# Patient Record
Sex: Female | Born: 1975 | Race: White | Hispanic: No | Marital: Single | Smoking: Never smoker
Health system: Southern US, Community
[De-identification: ages and names within clinical notes are randomized; demographics above are authoritative.]

## PROBLEM LIST (undated history)

## (undated) DIAGNOSIS — G919 Hydrocephalus, unspecified: Secondary | ICD-10-CM

## (undated) DIAGNOSIS — K219 Gastro-esophageal reflux disease without esophagitis: Secondary | ICD-10-CM

## (undated) DIAGNOSIS — K5900 Constipation, unspecified: Secondary | ICD-10-CM

## (undated) DIAGNOSIS — G7100 Muscular dystrophy, unspecified: Secondary | ICD-10-CM

## (undated) DIAGNOSIS — F419 Anxiety disorder, unspecified: Secondary | ICD-10-CM

## (undated) HISTORY — PX: OTHER SURGICAL HISTORY: SHX169

## (undated) HISTORY — DX: Anxiety disorder, unspecified: F41.9

---

## 2005-03-21 ENCOUNTER — Inpatient Hospital Stay: Payer: Self-pay | Admitting: Surgery

## 2007-06-09 ENCOUNTER — Emergency Department: Payer: Self-pay | Admitting: Emergency Medicine

## 2007-06-09 ENCOUNTER — Other Ambulatory Visit: Payer: Self-pay

## 2007-06-10 ENCOUNTER — Inpatient Hospital Stay: Payer: Self-pay | Admitting: Internal Medicine

## 2007-08-09 ENCOUNTER — Emergency Department: Payer: Self-pay

## 2007-08-29 ENCOUNTER — Emergency Department: Payer: Self-pay

## 2007-09-19 ENCOUNTER — Emergency Department: Payer: Self-pay | Admitting: Emergency Medicine

## 2007-11-23 ENCOUNTER — Ambulatory Visit: Payer: Self-pay | Admitting: Gastroenterology

## 2007-12-01 ENCOUNTER — Emergency Department: Payer: Self-pay | Admitting: Emergency Medicine

## 2008-09-07 ENCOUNTER — Emergency Department: Payer: Self-pay | Admitting: Emergency Medicine

## 2008-11-21 ENCOUNTER — Emergency Department: Payer: Self-pay | Admitting: Emergency Medicine

## 2008-11-21 ENCOUNTER — Ambulatory Visit: Payer: Self-pay | Admitting: Family Medicine

## 2008-12-28 ENCOUNTER — Emergency Department: Payer: Self-pay | Admitting: Emergency Medicine

## 2009-02-18 ENCOUNTER — Ambulatory Visit: Payer: Self-pay | Admitting: Unknown Physician Specialty

## 2009-03-05 ENCOUNTER — Ambulatory Visit: Payer: Self-pay | Admitting: Unknown Physician Specialty

## 2010-10-09 ENCOUNTER — Ambulatory Visit: Payer: Self-pay | Admitting: Internal Medicine

## 2011-04-15 ENCOUNTER — Ambulatory Visit: Payer: Self-pay | Admitting: Family Medicine

## 2012-02-24 ENCOUNTER — Ambulatory Visit: Payer: Self-pay | Admitting: Internal Medicine

## 2012-03-04 ENCOUNTER — Ambulatory Visit: Payer: Self-pay | Admitting: Internal Medicine

## 2012-05-14 ENCOUNTER — Ambulatory Visit: Payer: Self-pay | Admitting: Internal Medicine

## 2012-06-14 ENCOUNTER — Ambulatory Visit: Payer: Self-pay | Admitting: Emergency Medicine

## 2012-09-26 ENCOUNTER — Emergency Department: Payer: Self-pay | Admitting: Emergency Medicine

## 2012-09-26 LAB — URINALYSIS, COMPLETE
Bacteria: NONE SEEN
Bilirubin,UR: NEGATIVE
Blood: NEGATIVE
Glucose,UR: NEGATIVE mg/dL (ref 0–75)
Hyaline Cast: 11
Nitrite: NEGATIVE
Ph: 5 (ref 4.5–8.0)
Protein: NEGATIVE
Specific Gravity: 1.03 (ref 1.003–1.030)

## 2012-09-26 LAB — CBC WITH DIFFERENTIAL/PLATELET
Basophil #: 0 10*3/uL (ref 0.0–0.1)
Eosinophil #: 0 10*3/uL (ref 0.0–0.7)
Eosinophil %: 0 %
HGB: 14.7 g/dL (ref 12.0–16.0)
Lymphocyte %: 2.1 %
MCH: 30.1 pg (ref 26.0–34.0)
Neutrophil %: 92.5 %
Platelet: 248 10*3/uL (ref 150–440)
RDW: 13.7 % (ref 11.5–14.5)
WBC: 23 10*3/uL — ABNORMAL HIGH (ref 3.6–11.0)

## 2012-09-26 LAB — COMPREHENSIVE METABOLIC PANEL
Albumin: 4.5 g/dL (ref 3.4–5.0)
BUN: 21 mg/dL — ABNORMAL HIGH (ref 7–18)
Calcium, Total: 9.9 mg/dL (ref 8.5–10.1)
Co2: 26 mmol/L (ref 21–32)
Creatinine: 0.7 mg/dL (ref 0.60–1.30)
EGFR (Non-African Amer.): >60
Osmolality: 298 (ref 275–301)
SGOT(AST): 26 U/L (ref 15–37)
Sodium: 148 mmol/L — ABNORMAL HIGH (ref 136–145)

## 2012-09-27 ENCOUNTER — Ambulatory Visit: Payer: Self-pay | Admitting: Emergency Medicine

## 2012-09-27 LAB — COMPREHENSIVE METABOLIC PANEL
Albumin: 3.5 g/dL (ref 3.4–5.0)
Alkaline Phosphatase: 71 U/L (ref 50–136)
Anion Gap: 11 (ref 7–16)
BUN: 17 mg/dL (ref 7–18)
Bilirubin,Total: 1 mg/dL (ref 0.2–1.0)
Calcium, Total: 9.4 mg/dL (ref 8.5–10.1)
Chloride: 111 mmol/L — ABNORMAL HIGH (ref 98–107)
Co2: 27 mmol/L (ref 21–32)
Creatinine: 0.7 mg/dL (ref 0.60–1.30)
EGFR (African American): >60
EGFR (Non-African Amer.): >60
Glucose: 96 mg/dL (ref 65–99)
Osmolality: 298 (ref 275–301)
Potassium: 3 mmol/L — ABNORMAL LOW (ref 3.5–5.1)
SGOT(AST): 24 U/L (ref 15–37)
SGPT (ALT): 14 U/L (ref 12–78)
Sodium: 149 mmol/L — ABNORMAL HIGH (ref 136–145)
Total Protein: 6.9 g/dL (ref 6.4–8.2)

## 2012-09-27 LAB — LIPASE, BLOOD: Lipase: 52 U/L — ABNORMAL LOW (ref 73–393)

## 2012-09-27 LAB — CBC WITH DIFFERENTIAL/PLATELET
Basophil %: 0.4 %
Eosinophil #: 0.1 10*3/uL (ref 0.0–0.7)
Eosinophil %: 0.4 %
HCT: 39.8 % (ref 35.0–47.0)
HGB: 12.9 g/dL (ref 12.0–16.0)
Lymphocyte #: 1.3 10*3/uL (ref 1.0–3.6)
Lymphocyte %: 8.1 %
MCV: 91 fL (ref 80–100)
Monocyte %: 6.7 %
Neutrophil %: 84.4 %
Platelet: 185 10*3/uL (ref 150–440)
RDW: 13.5 % (ref 11.5–14.5)

## 2012-09-28 ENCOUNTER — Inpatient Hospital Stay: Payer: Self-pay | Admitting: Surgery

## 2012-09-28 LAB — BASIC METABOLIC PANEL
BUN: 5 mg/dL — ABNORMAL LOW (ref 7–18)
Calcium, Total: 8.5 mg/dL (ref 8.5–10.1)
Chloride: 112 mmol/L — ABNORMAL HIGH (ref 98–107)
Creatinine: 0.5 mg/dL — ABNORMAL LOW (ref 0.60–1.30)
EGFR (Non-African Amer.): >60
Glucose: 93 mg/dL (ref 65–99)
Osmolality: 286 (ref 275–301)
Potassium: 3.1 mmol/L — ABNORMAL LOW (ref 3.5–5.1)
Sodium: 145 mmol/L (ref 136–145)

## 2012-11-21 ENCOUNTER — Ambulatory Visit: Payer: Self-pay

## 2013-02-20 ENCOUNTER — Ambulatory Visit: Payer: Self-pay | Admitting: Emergency Medicine

## 2013-03-28 ENCOUNTER — Ambulatory Visit: Payer: Self-pay | Admitting: Family Medicine

## 2013-07-28 ENCOUNTER — Encounter: Payer: Self-pay | Admitting: Podiatry

## 2013-07-31 ENCOUNTER — Ambulatory Visit (INDEPENDENT_AMBULATORY_CARE_PROVIDER_SITE_OTHER): Payer: Medicaid Other | Admitting: Podiatry

## 2013-07-31 ENCOUNTER — Encounter: Payer: Self-pay | Admitting: Podiatry

## 2013-07-31 ENCOUNTER — Ambulatory Visit: Payer: Self-pay | Admitting: Podiatry

## 2013-07-31 VITALS — BP 129/55 | HR 81 | Resp 16 | Wt 117.0 lb

## 2013-07-31 DIAGNOSIS — M79609 Pain in unspecified limb: Secondary | ICD-10-CM

## 2013-07-31 DIAGNOSIS — B351 Tinea unguium: Secondary | ICD-10-CM

## 2013-07-31 NOTE — Progress Notes (Signed)
Lindsey Allison presents today with a chief complaint of painful toenails one through 5 bilateral.  Objective: Vital signs are stable she is alert and oriented x3. She has pain on palpation of the nails 1 through 5 bilateral.  Assessment: Pain in limb secondary to onychomycosis.  Plan: Debridement of nails 1 through 5 bilateral covered service secondary to pain. Followup with her in 3 months

## 2013-10-30 ENCOUNTER — Ambulatory Visit: Payer: Medicaid Other | Admitting: Podiatry

## 2013-11-15 ENCOUNTER — Encounter: Payer: Self-pay | Admitting: Podiatry

## 2013-11-15 ENCOUNTER — Ambulatory Visit (INDEPENDENT_AMBULATORY_CARE_PROVIDER_SITE_OTHER): Payer: Medicaid Other | Admitting: Podiatry

## 2013-11-15 VITALS — BP 122/74 | HR 76 | Resp 16

## 2013-11-15 DIAGNOSIS — M79609 Pain in unspecified limb: Secondary | ICD-10-CM

## 2013-11-15 DIAGNOSIS — B351 Tinea unguium: Secondary | ICD-10-CM

## 2013-11-15 NOTE — Progress Notes (Signed)
She presents today with a chief complaint of painful elongated toenails. They hurt when I walk.  Objective: Vital signs are stable she is alert and oriented x3. Pulses are palpable bilateral mild edema bilateral. Nails are thick yellow dystrophic clinically mycotic and incurvated and turned under.  Assessment: Pain in limb secondary to onychomycosis and nail deformity.   Plan: Discussed etiology pathology conservative surgical therapies debridement nails 1 through 5 bilateral. Followup with her in 3 months.

## 2014-02-19 ENCOUNTER — Ambulatory Visit (INDEPENDENT_AMBULATORY_CARE_PROVIDER_SITE_OTHER): Payer: Medicaid Other | Admitting: Podiatry

## 2014-02-19 VITALS — BP 92/76 | HR 89 | Resp 16

## 2014-02-19 DIAGNOSIS — B351 Tinea unguium: Secondary | ICD-10-CM

## 2014-02-19 DIAGNOSIS — M79609 Pain in unspecified limb: Secondary | ICD-10-CM

## 2014-02-19 DIAGNOSIS — M79676 Pain in unspecified toe(s): Secondary | ICD-10-CM

## 2014-02-19 NOTE — Progress Notes (Signed)
She presents today chief complaint of painful elongated toenails.  Objective: Nails are thick yellow dystrophic with mycotic and painful palpation.  Assessment: Pain in limb secondary to onychomycosis 1 through 5 bilateral.  Plan: Debridement of nails 1 through 5 bilateral covered service secondary to pain.

## 2014-05-30 ENCOUNTER — Ambulatory Visit (INDEPENDENT_AMBULATORY_CARE_PROVIDER_SITE_OTHER): Payer: Medicaid Other | Admitting: Podiatry

## 2014-05-30 DIAGNOSIS — B351 Tinea unguium: Secondary | ICD-10-CM

## 2014-05-30 DIAGNOSIS — M79676 Pain in unspecified toe(s): Secondary | ICD-10-CM | POA: Diagnosis not present

## 2014-05-30 NOTE — Progress Notes (Signed)
Presents today chief complaint of painful elongated toenails.  Objective: Pulses are palpable bilateral nails are thick, yellow dystrophic onychomycosis and painful palpation.   Assessment: Onychomycosis with pain in limb.  Plan: Treatment of nails in thickness and length as covered service secondary to pain.  

## 2014-08-29 ENCOUNTER — Ambulatory Visit: Payer: Medicaid Other | Admitting: Podiatry

## 2014-09-10 ENCOUNTER — Ambulatory Visit (INDEPENDENT_AMBULATORY_CARE_PROVIDER_SITE_OTHER): Payer: Medicaid Other | Admitting: Podiatry

## 2014-09-10 DIAGNOSIS — B351 Tinea unguium: Secondary | ICD-10-CM | POA: Diagnosis not present

## 2014-09-10 DIAGNOSIS — M79676 Pain in unspecified toe(s): Secondary | ICD-10-CM | POA: Diagnosis not present

## 2014-09-10 NOTE — Progress Notes (Signed)
She presents today chief complaint of painful elongated toenails.  Objective: Nails are thick yellow dystrophic with mycotic and painful palpation.  Assessment: Pain in limb secondary to onychomycosis 1 through 5 bilateral.  Plan: Debridement of nails 1 through 5 bilateral covered service secondary to pain. 

## 2014-12-07 ENCOUNTER — Ambulatory Visit
Admit: 2014-12-07 | Disposition: A | Discharge status: Home / Self Care | Payer: Self-pay | Attending: Family Medicine | Admitting: Family Medicine

## 2014-12-10 ENCOUNTER — Ambulatory Visit (INDEPENDENT_AMBULATORY_CARE_PROVIDER_SITE_OTHER): Payer: Medicaid Other | Admitting: Podiatry

## 2014-12-10 DIAGNOSIS — M79676 Pain in unspecified toe(s): Secondary | ICD-10-CM | POA: Diagnosis not present

## 2014-12-10 DIAGNOSIS — B351 Tinea unguium: Secondary | ICD-10-CM

## 2014-12-10 NOTE — Progress Notes (Signed)
She presents today chief complaint of painful elongated toenails.  Objective: Nails are thick yellow dystrophic with mycotic and painful palpation.  Assessment: Pain in limb secondary to onychomycosis 1 through 5 bilateral.  Plan: Debridement of nails 1 through 5 bilateral covered service secondary to pain. 

## 2014-12-14 NOTE — H&P (Signed)
    Subjective/Chief Complaint nausea and vomiting    History of Present Illness Sunday  2d n/v, single BM yesterday, no diarrhea, no one else is ill at home no f/c no hemetemesis, not passing gas no prior episode    Past History CP VP shunt GERD   Past Med/Surgical Hx:  GERD:   tendency to falls.  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:   ALLERGIES:  Belladonna/ Butabarbital: Unknown  HOME MEDICATIONS: Medication Instructions Status  Ativan 0.5 mg oral tablet 1 tab(s) orally once a day (at bedtime) Active  Zofran 8 mg oral tablet 1 tab(s) orally 2 times a day Active  Prilosec 20 mg oral delayed release capsule 1 cap(s) orally once a day Active  multivitamin 1 tab(s) orally once a day Active  Senna 2 tab(s) orally once a day, As Needed- for Constipation  Active  Depo-Provera 1  injectable every 3 months Active  Periogard 0.12% mucous membrane liquid 15 milliliter(s) mucous membrane 2 times a day Active   Family and Social History:   Family History Non-Contributory    Social History negative tobacco, negative ETOH    Place of Living Nursing Home   Review of Systems:   Fever/Chills No    Cough No    Sputum No    Abdominal Pain Yes  resolving    Diarrhea No    Constipation Yes    Nausea/Vomiting Yes    SOB/DOE No    Chest Pain No    Dysuria No    Tolerating Diet No  Nauseated  Vomiting    Medications/Allergies Reviewed Medications/Allergies reviewed   Physical Exam:   GEN no acute distress, thin    HEENT pink conjunctivae, dry oral mucosa, poor dentition    NECK supple    RESP normal resp effort  clear BS  no use of accessory muscles    CARD regular rate    ABD denies tenderness  soft  distended  normal BS    LYMPH negative neck    SKIN normal to palpation    PSYCH alert, answers questions well, caregiver present   Radiology Results: XRay:    04-Feb-14 17:05, Abdomen 2V Flat and Erect  ([place])   Abdomen 2V Flat and Erect ([place])   REASON FOR EXAM:    pain vomiting  COMMENTS:       PROCEDURE: MDR - MDR ABDOMEN 2V FLAT AND ERECT  - Sep 27 2012  5:05PM     RESULT: Multiple dilated small bowel loops are noted. Colon is   nondistended. These findings suggest partial or early small bowel   obstruction. No free air. Surgical clips right upper quadrant.   Ventriculoperitoneal shunt tube noted. Stool is present in the colon.    IMPRESSION:  Findings suggesting partial versus early small bowel   obstruction. Adynamic ileus cannot becompletely excluded.        Verified By: Gwynn BurlyHOMAS E. REGISTER, M.D., MD     Assessment/Admission Diagnosis CT rev'd ileus vs pSBO admit, correct electrolytes reexamine repeat films, NG tube   Electronic Signatures: Lattie Hawooper, Richard E (MD)  (Signed 05-Feb-14 01:57)  Authored: CHIEF COMPLAINT and HISTORY, PAST MEDICAL/SURGIAL HISTORY, ALLERGIES, HOME MEDICATIONS, FAMILY AND SOCIAL HISTORY, REVIEW OF SYSTEMS, PHYSICAL EXAM, Radiology, ASSESSMENT AND PLAN   Last Updated: 05-Feb-14 01:57 by Lattie Hawooper, Richard E (MD)

## 2014-12-14 NOTE — Discharge Summary (Signed)
PATIENT NAME:  Lindsey Allison, Lindsey Allison MR#:  [id number] DATE OF BIRTH:  1976-02-27  DATE OF ADMISSION:  09/28/2012 DATE OF DISCHARGE:  09/30/2012  CHIEF COMPLAINT: Nausea and vomiting.   DIAGNOSES: Partial small bowel obstruction versus ileus, reflux disease and neurologic deficit.   PROCEDURES: None.   CONSULTANTS: None.   HISTORY OF PRESENT ILLNESS/HOSPITAL COURSE: This is a patient who has some form of either cerebral palsy or mental retardation. She is able answer questions well, but apparently lives in a group home and is accompanied in the ER by a caregiver, who assists with history. The patient had been having nausea and vomiting for several days. Her pain was nearly completely gone when she was seen in the Emergency Room, but had been vomiting multiple times without hematemesis and had a small bowel movement earlier in the day. A workup had suggested a partial small bowel obstruction. She was admitted to the hospital and a nasogastric tube was placed. However, she kept pulling the nasogastric tube out. Ultimately, she was passing gas and repeat films demonstrated improvement in the partial small bowel obstruction versus ileus and spontaneous resolution. She was sent home, tolerating a regular diet to resume all of her home medications and follow up in our office as needed.     ____________________________ Adah Salvageichard E. Excell Seltzerooper, MD rec:aw D: 10/09/2012 19:35:22 ET T: 10/10/2012 10:09:57 ET JOB#: [id number]  cc: Adah Salvageichard E. Excell Seltzerooper, MD, <Dictator> Lattie HawICHARD E COOPER MD ELECTRONICALLY SIGNED 10/17/2012 9:42

## 2014-12-14 NOTE — H&P (Signed)
PATIENT NAME:  Lindsey Allison, [name] MR#:  [id number] DATE OF BIRTH:  November 18, 1975  DATE OF ADMISSION:  09/27/2012  CHIEF COMPLAINT:  Nausea and vomiting.   HISTORY OF PRESENT ILLNESS:  This is a patient who has cerebral palsy and/or mental retardation, apparently lives in a group home and is accompanied by a caregiver.  She presents with two days of nausea, vomiting and abdominal pain.  The abdominal pain is resolving or almost gone, but she has vomited multiple times.  Denies hematemesis, had a small bowel movement or two yesterday, none since and no flatus.  She has not had any melena or hematochezia.  A lot of this history is from the patient who answers questions fairly well, but some of her questions are answered by the caregiver as well.    She has never had an episode like this before according to her, however in reviewing her chart she had an episode of what was believed to be a small bowel obstruction and was admitted to the hospital back in 2008 with spontaneous resolution of this.   PAST MEDICAL HISTORY:  Reflux disease, mental retardation versus cerebral palsy.   PAST SURGICAL HISTORY:  VP shunt.   ALLERGIES:  BELLADONNA AND BUTABARBITAL.   MEDICATIONS:  Zofran, senna, Prilosec, Peri-Guard, Depo-Provera and Ativan.   FAMILY HISTORY:  Noncontributory.   SOCIAL HISTORY:  The patient does not smoke or drink.   REVIEW OF SYSTEMS:  Not obtainable with the exception of that mentioned in the history of present illness.   PHYSICAL EXAMINATION: GENERAL:  Healthy, comfortable, thin Caucasian female patient.  VITAL SIGNS:  Afebrile with a temperature of 98.9, pulse of 92, respirations 18, blood pressure 112/53.  The pain scale is 8, but when I questioned her, her pain was near 0, 100% room air sat.  HEENT:  Shows no scleral icterus.  No palpable neck nodes.  Her mouth is performed open and therefore her teeth and oral mucosa are fairly dry.  CHEST:  Clear to auscultation.  CARDIAC:   Regular rate and rhythm.  ABDOMEN:  Soft, slightly distended, nontender, minimally tympanitic.  EXTREMITIES:  Are without edema.  NEUROLOGIC:  Grossly intact with the exception of her slight mental retardation.  INTEGUMENT:  Shows no jaundice.   LABORATORY, DIAGNOSTIC AND RADIOLOGICAL DATA:  CT scan is personally reviewed, suggestive of an ileus versus a small bowel obstruction.  There is gas throughout the colon and a VP shunt is noted.    White blood cell count is 15.4, H and H of 12.9 and 40, platelet count of 185, potassium is 3.0, sodium of 149, chloride of 111.   ASSESSMENT AND PLAN:  This is a patient with an ileus versus small bowel obstruction.  She will be admitted to the hospital and a nasogastric tube will be placed.  She will be rehydrated and electrolytes will be corrected.  Serial examination will be performed with serial x-rays.  Hopefully this will resolve without surgical intervention as it has in the past.     ____________________________ Adah Salvageichard E. Excell Seltzerooper, MD rec:ea D: 09/28/2012 02:05:27 ET T: 09/28/2012 04:14:51 ET JOB#: [id number]  cc: Adah Salvageichard E. Excell Seltzerooper, MD, <Dictator> Lattie HawICHARD E COOPER MD ELECTRONICALLY SIGNED 09/29/2012 1:25

## 2015-02-04 DIAGNOSIS — G7111 Myotonic muscular dystrophy: Secondary | ICD-10-CM | POA: Insufficient documentation

## 2015-03-25 ENCOUNTER — Ambulatory Visit (INDEPENDENT_AMBULATORY_CARE_PROVIDER_SITE_OTHER): Payer: Medicaid Other | Admitting: Podiatry

## 2015-03-25 DIAGNOSIS — B351 Tinea unguium: Secondary | ICD-10-CM

## 2015-03-25 DIAGNOSIS — M79676 Pain in unspecified toe(s): Secondary | ICD-10-CM | POA: Diagnosis not present

## 2015-03-25 NOTE — Progress Notes (Signed)
She presents today chief complaint of painful elongated toenails.  Objective: Nails are thick yellow dystrophic with mycotic and painful palpation.  Assessment: Pain in limb secondary to onychomycosis 1 through 5 bilateral.  Plan: Debridement of nails 1 through 5 bilateral covered service secondary to pain. 3 mo.

## 2015-06-24 ENCOUNTER — Ambulatory Visit (INDEPENDENT_AMBULATORY_CARE_PROVIDER_SITE_OTHER): Payer: Medicaid Other | Admitting: Podiatry

## 2015-06-24 DIAGNOSIS — M79676 Pain in unspecified toe(s): Secondary | ICD-10-CM | POA: Diagnosis not present

## 2015-06-24 DIAGNOSIS — B351 Tinea unguium: Secondary | ICD-10-CM

## 2015-06-24 NOTE — Progress Notes (Signed)
She presents today chief complaint of painful elongated toenails.  Objective: Nails are thick yellow dystrophic with mycotic and painful palpation.  Assessment: Pain in limb secondary to onychomycosis 1 through 5 bilateral.  Plan: Debridement of nails 1 through 5 bilateral covered service secondary to pain. 3 mo.  Arbutus Pedodd Hyatt DPM

## 2015-09-23 ENCOUNTER — Encounter (INDEPENDENT_AMBULATORY_CARE_PROVIDER_SITE_OTHER): Payer: Medicaid Other | Admitting: Podiatry

## 2015-09-23 ENCOUNTER — Ambulatory Visit: Payer: Medicaid Other

## 2015-09-23 ENCOUNTER — Encounter: Payer: Self-pay | Admitting: Podiatry

## 2015-09-23 DIAGNOSIS — B351 Tinea unguium: Secondary | ICD-10-CM | POA: Diagnosis not present

## 2015-09-23 DIAGNOSIS — M79676 Pain in unspecified toe(s): Secondary | ICD-10-CM | POA: Diagnosis not present

## 2015-09-23 NOTE — Addendum Note (Signed)
Addended by: Ernestene Kiel T on: 09/23/2015 10:09 AM   Modules accepted: Medications, Level of Service

## 2015-09-23 NOTE — Progress Notes (Addendum)
  She presents today with a chief complaint of painful elongated toenails.  Objective: Vital signs stable alert and oriented 3. Pulses are palpable. Toenails are thick yellow dystrophic onychomycotic. No open lesions or wounds.  Assessment: Pain in limb secondary to onychomycosis 1 through 5 bilateral.  Plan: Debridement of toenails 1 through 5 bilateral covered service secondary to pain. Follow up with her in 3 months.

## 2015-10-22 ENCOUNTER — Encounter: Payer: Self-pay | Admitting: Emergency Medicine

## 2015-10-22 ENCOUNTER — Ambulatory Visit
Admission: EM | Admit: 2015-10-22 | Discharge: 2015-10-22 | Disposition: A | Discharge status: Home / Self Care | Payer: Medicaid Other | Attending: Family Medicine | Admitting: Family Medicine

## 2015-10-22 DIAGNOSIS — J069 Acute upper respiratory infection, unspecified: Secondary | ICD-10-CM | POA: Insufficient documentation

## 2015-10-22 DIAGNOSIS — B349 Viral infection, unspecified: Secondary | ICD-10-CM

## 2015-10-22 DIAGNOSIS — J029 Acute pharyngitis, unspecified: Secondary | ICD-10-CM | POA: Insufficient documentation

## 2015-10-22 HISTORY — DX: Muscular dystrophy, unspecified: G71.00

## 2015-10-22 HISTORY — DX: Gastro-esophageal reflux disease without esophagitis: K21.9

## 2015-10-22 HISTORY — DX: Constipation, unspecified: K59.00

## 2015-10-22 HISTORY — DX: Hydrocephalus, unspecified: G91.9

## 2015-10-22 LAB — RAPID INFLUENZA A&B ANTIGENS (ARMC ONLY)
INFLUENZA A (ARMC): NOT DETECTED — AB
INFLUENZA B (ARMC): NOT DETECTED — AB

## 2015-10-22 LAB — RAPID STREP SCREEN (MED CTR MEBANE ONLY): STREPTOCOCCUS, GROUP A SCREEN (DIRECT): NEGATIVE

## 2015-10-22 MED ORDER — LIDOCAINE VISCOUS 2 % MT SOLN
15.0000 mL | Freq: Three times a day (TID) | OROMUCOSAL | Status: DC | PRN
Start: 2015-10-22 — End: 2016-12-24

## 2015-10-22 MED ORDER — LORATADINE 10 MG PO TABS: 10.0000 mg | ORAL_TABLET | Freq: Every day | ORAL | Status: AC

## 2015-10-22 NOTE — ED Notes (Signed)
Pt with caregiver, coughing started this weekend, now sore throat and looks worse. Denies fever.

## 2015-10-22 NOTE — Discharge Instructions (Signed)
Take medication as prescribed. Rest. Drink plenty of fluids. Take over the counter tylenol or ibuprofen as needed for fever. Continue home medications.  Follow up with your primary care physician this week in one week.   Return to Urgent care for new or worsening concerns.   Viral Infections A viral infection can be caused by different types of viruses.Most viral infections are not serious and resolve on their own. However, some infections may cause severe symptoms and may lead to further complications. SYMPTOMS Viruses can frequently cause:  Minor sore throat.  Aches and pains.  Headaches.  Runny nose.  Different types of rashes.  Watery eyes.  Tiredness.  Cough.  Loss of appetite.  Gastrointestinal infections, resulting in nausea, vomiting, and diarrhea. These symptoms do not respond to antibiotics because the infection is not caused by bacteria. However, you might catch a bacterial infection following the viral infection. This is sometimes called a "superinfection." Symptoms of such a bacterial infection may include:  Worsening sore throat with pus and difficulty swallowing.  Swollen neck glands.  Chills and a high or persistent fever.  Severe headache.  Tenderness over the sinuses.  Persistent overall ill feeling (malaise), muscle aches, and tiredness (fatigue).  Persistent cough.  Yellow, green, or brown mucus production with coughing. HOME CARE INSTRUCTIONS   Only take over-the-counter or prescription medicines for pain, discomfort, diarrhea, or fever as directed by your caregiver.  Drink enough water and fluids to keep your urine clear or pale yellow. Sports drinks can provide valuable electrolytes, sugars, and hydration.  Get plenty of rest and maintain proper nutrition. Soups and broths with crackers or rice are fine. SEEK IMMEDIATE MEDICAL CARE IF:   You have severe headaches, shortness of breath, chest pain, neck pain, or an unusual rash.  You  have uncontrolled vomiting, diarrhea, or you are unable to keep down fluids.  You or your child has an oral temperature above 102 F (38.9 C), not controlled by medicine.  Your baby is older than 3 months with a rectal temperature of 102 F (38.9 C) or higher.  Your baby is 55 months old or younger with a rectal temperature of 100.4 F (38 C) or higher. MAKE SURE YOU:   Understand these instructions.  Will watch your condition.  Will get help right away if you are not doing well or get worse.   This information is not intended to replace advice given to you by your health care provider. Make sure you discuss any questions you have with your health care provider.   Document Released: 05/20/2005 Document Revised: 11/02/2011 Document Reviewed: 01/16/2015 Elsevier Interactive Patient Education [employer]

## 2015-10-22 NOTE — ED Provider Notes (Signed)
[facility]  ____________________________________________  Time seen: Approximately 7:25 PM  I have reviewed the triage vital signs and the nursing notes.   HISTORY  Chief Complaint Sore Throat and URI  HPI Lindsey Allison is a 40 y.o. female presents with caregiver from group home at bedside. Presents for the complaints of 3-4 days of runny nose, nasal congestion, sore throat and cough.  Denies fevers. Reports continues to eat and drink. Denies behavior changes. Patient reports that she has continued to go to work patient persisted she did work today. Caregiver at bedside also denies fevers. Denies sick contacts in group home. Reports onset contacts at work.  Patient states that her main complaint is her sore throat but also states that she is frequently having to blow her nose frequently. And also states the cough has been keeping her up.  Denies chest pain or shortness breath, abdominal pain, dysuria, rash, neck or back pain.   Past Medical History  Diagnosis Date  . Anxiety   . Acid reflux   . Constipation   . Muscular dystrophy (HCC)   . Hydrocephalus     There are no active problems to display for this patient.   Past Surgical History  Procedure Laterality Date  . Shunt in head      Current Outpatient Rx  Name  Route  Sig  Dispense  Refill  . acetaminophen (TYLENOL) 325 MG tablet   Oral   Take 650 mg by mouth every 4 (four) hours as needed.         .           . chlorhexidine (PERIOGARD) 0.12 % solution   Mouth/Throat   Use as directed 15 mLs in the mouth or throat 2 (two) times daily.         Marland Kitchen FLUoxetine (PROZAC) 20 MG capsule   Oral   Take 20 mg by mouth daily.         .           .           . LORazepam (ATIVAN) 0.5 MG tablet   Oral   Take 0.5 mg by mouth as needed for anxiety.         Marland Kitchen LORazepam (ATIVAN) 1 MG tablet   Oral   Take 1 mg by mouth as needed for anxiety.         . medroxyPROGESTERone (DEPO-PROVERA) 150  MG/ML injection   Intramuscular   Inject 150 mg into the muscle every 3 (three) months.         .            . Multiple Vitamins-Minerals (MULTIVITAMIN PO)   Oral   Take by mouth daily.         . naproxen (NAPROSYN) 375 MG tablet   Oral   Take 375 mg by mouth 2 (two) times daily with a meal.         . Neomycin-Polymyxin-Dexameth (MAXITROL OP)   Ophthalmic   Apply to eye. Apply three times daily to right eye, then taper weekly         . omeprazole (PRILOSEC) 20 MG capsule   Oral   Take 20 mg by mouth daily.         . ondansetron (ZOFRAN) 8 MG tablet   Oral   Take by mouth 2 (two) times daily.         . Pseudoeph-Bromphen-DM (ROBITUSSIN ALLERGY/COUGH PO)   Oral  Take by mouth as needed.         . Senna (SENNTAB PO)   Oral   Take by mouth 2 (two) times daily.         . Sod Fluoride-Potassium Nitrate (PREVIDENT 5000 SENSITIVE DT)   dental   Place onto teeth. Use as directed           Allergies Review of patient's allergies indicates no known allergies.  History reviewed. No pertinent family history.  Social History Social History  Substance Use Topics  . Smoking status: Never Smoker   . Smokeless tobacco: Never Used  . Alcohol Use: No    Review of Systems Constitutional: No fever/chills Eyes: No visual changes. ENT: Positive runny nose, nasal congestion, cough and sore throat. Cardiovascular: Denies chest pain. Respiratory: Denies shortness of breath. Gastrointestinal: No abdominal pain.  No nausea, no vomiting.  No diarrhea.  No constipation. Genitourinary: Negative for dysuria. Musculoskeletal: Negative for back pain. Skin: Negative for rash. Neurological: Negative for headaches, focal weakness or numbness.  10-point ROS otherwise negative.  ____________________________________________   PHYSICAL EXAM:  VITAL SIGNS: ED Triage Vitals  Enc Vitals Group     BP 10/22/15 1819 121/75 mmHg     Pulse Rate 10/22/15 1819 80     Resp  10/22/15 1819 18     Temp 10/22/15 1819 98 F (36.7 C)     Temp Source 10/22/15 1819 Oral     SpO2 10/22/15 1819 100 %     Weight --      Height --      Head Cir --      Peak Flow --      Pain Score 10/22/15 1826 8     Pain Loc --      Pain Edu? --      Excl. in GC? --   Constitutional: Alert and oriented. Well appearing and in no acute distress. Eyes: Conjunctivae are normal. PERRL. EOMI. Head: Atraumatic. No sinus tenderness to palpation. No swelling. No erythema.  Ears: no erythema, normal TMs bilaterally.   Nose:Nasal congestion with clear rhinorrhea  Mouth/Throat: Mucous membranes are moist. Mild pharyngeal erythema. No tonsillar swelling or exudate.  Neck: No stridor.  No cervical spine tenderness to palpation. Hematological/Lymphatic/Immunilogical: No cervical lymphadenopathy. Cardiovascular: Normal rate, regular rhythm. Grossly normal heart sounds.  Good peripheral circulation. Respiratory: Normal respiratory effort.  No retractions. Lungs CTAB.No wheezes, rales or rhonchi. Good air movement.  Gastrointestinal: Soft and nontender. Normal Bowel sounds. No CVA tenderness. Musculoskeletal: No lower or upper extremity tenderness nor edema. No cervical, thoracic or lumbar tenderness to palpation. Neurologic:  Normal speech and language. No gross focal neurologic deficits are appreciated. No gait instability. Skin:  Skin is warm, dry and intact. No rash noted. Psychiatric: Mood and affect are normal. Speech and behavior are normal.  ____________________________________________   LABS (all labs ordered are listed, but only abnormal results are displayed)  Labs Reviewed  RAPID INFLUENZA A&B ANTIGENS ([facility] ONLY) - Abnormal; Notable for the following:    Influenza A [facility]) NOT DETECTED (*)    Influenza B ([facility]) NOT DETECTED (*)    All other components within normal limits  RAPID STREP SCREEN (NOT AT [facility])  CULTURE, GROUP A STREP [facility])     INITIAL IMPRESSION / ASSESSMENT AND  PLAN / ED COURSE  Pertinent labs & imaging results that were available during my care of the patient were reviewed by me and considered in my medical decision making (see chart for  details).  Very well-appearing patient. No acute distress. Caregiver from group home at bedside. Presents for complaints of 3-4 days of runny nose, nasal congestion, sore throat, cough. Denies fevers. Reports he needs to eat and drink well. Lungs clear throughout. Abdomen soft and nontender. Suspect viral upper respiratory infection. Influenza negative. Quick strep negative, will culture. Patient reports has been taking over-the-counter Robitussin with some improvement.  Discussed patient and caregiver suspect viral upper extremity infection. Will treat symptomatically. Continue home Robitussin as needed. Will treat with oral Claritin during the day as well as viscous lidocaine gargles as needed for sore throat. Encouraged patient to follow-up. Over-the-counter Tylenol or ibuprofen as needed for fevers. Work note for today and tomorrow given.  Discussed follow up with Primary care physician this week. Discussed follow up and return parameters including no resolution or any worsening concerns. Patient verbalized understanding and agreed to plan.   ____________________________________________   FINAL CLINICAL IMPRESSION(S) / ED DIAGNOSES  Final diagnoses:  Viral illness      Note: This dictation was prepared with Dragon dictation along with smaller phrase technology. Any transcriptional errors that result from this process are unintentional.    Renford Dills, NP 10/23/15 0022

## 2015-10-24 LAB — CULTURE, GROUP A STREP (THRC)

## 2015-12-23 ENCOUNTER — Encounter: Payer: Self-pay | Admitting: Podiatry

## 2015-12-23 ENCOUNTER — Ambulatory Visit (INDEPENDENT_AMBULATORY_CARE_PROVIDER_SITE_OTHER): Payer: Medicaid Other | Admitting: Podiatry

## 2015-12-23 DIAGNOSIS — M79676 Pain in unspecified toe(s): Secondary | ICD-10-CM

## 2015-12-23 DIAGNOSIS — B351 Tinea unguium: Secondary | ICD-10-CM | POA: Diagnosis not present

## 2015-12-23 NOTE — Progress Notes (Signed)
She presents today to complaint of painful elongated toenails.  Objective: Vital signs are stable she's alert and oriented 3. Toenails are thick yellow dystrophic mycotic.  Assessment: Pain limb secondary onychomycosis 1 through 5 bilateral.  Plan: Debridement of toenails 1 through 5 bilateral covered service secondary to pain.

## 2016-01-03 ENCOUNTER — Emergency Department
Admission: EM | Admit: 2016-01-03 | Discharge: 2016-01-03 | Disposition: A | Discharge status: Home / Self Care | Payer: Medicaid Other | Attending: Emergency Medicine | Admitting: Emergency Medicine

## 2016-01-03 ENCOUNTER — Encounter: Payer: Self-pay | Admitting: Emergency Medicine

## 2016-01-03 ENCOUNTER — Emergency Department: Payer: Medicaid Other

## 2016-01-03 DIAGNOSIS — W1839XA Other fall on same level, initial encounter: Secondary | ICD-10-CM | POA: Diagnosis not present

## 2016-01-03 DIAGNOSIS — T148XXA Other injury of unspecified body region, initial encounter: Secondary | ICD-10-CM

## 2016-01-03 DIAGNOSIS — Y9341 Activity, dancing: Secondary | ICD-10-CM | POA: Insufficient documentation

## 2016-01-03 DIAGNOSIS — S5001XA Contusion of right elbow, initial encounter: Secondary | ICD-10-CM | POA: Diagnosis not present

## 2016-01-03 DIAGNOSIS — S0003XA Contusion of scalp, initial encounter: Secondary | ICD-10-CM | POA: Insufficient documentation

## 2016-01-03 DIAGNOSIS — Y999 Unspecified external cause status: Secondary | ICD-10-CM | POA: Diagnosis not present

## 2016-01-03 DIAGNOSIS — Y929 Unspecified place or not applicable: Secondary | ICD-10-CM | POA: Insufficient documentation

## 2016-01-03 DIAGNOSIS — S0990XA Unspecified injury of head, initial encounter: Secondary | ICD-10-CM | POA: Diagnosis present

## 2016-01-03 DIAGNOSIS — S0083XA Contusion of other part of head, initial encounter: Secondary | ICD-10-CM | POA: Diagnosis not present

## 2016-01-03 DIAGNOSIS — G44319 Acute post-traumatic headache, not intractable: Secondary | ICD-10-CM

## 2016-01-03 MED ORDER — ACETAMINOPHEN 160 MG/5ML PO SUSP
ORAL | Status: AC
  Administered 2016-01-03: 650 mg via ORAL
  Filled 2016-01-03: qty 25

## 2016-01-03 MED ORDER — ACETAMINOPHEN 325 MG PO TABS: 650.0000 mg | ORAL_TABLET | Freq: Once | ORAL | Status: DC

## 2016-01-03 MED ORDER — ACETAMINOPHEN 160 MG/5ML PO SOLN
650.0000 mg | Freq: Once | ORAL | Status: AC
  Administered 2016-01-03: 650 mg via ORAL
  Filled 2016-01-03: qty 20.3

## 2016-01-03 NOTE — ED Provider Notes (Signed)
[facility] Emergency Department Provider Note ____________________________________________  Time seen: Approximately 11:52 AM  I have reviewed the triage vital signs and the nursing notes.   HISTORY  Chief Complaint Fall and Headache   HPI Lindsey Allison is a 40 y.o. female who presents to the emergency department for evaluation after falling backward and hitting her head on a concrete floor. She was dancing and lost her balance. The fall was witnessed by many and confirmed no LOC. Pt lives in a group home and her caregivers feel that she is at her baseline cognition. She has been ambulatory since the fall. She complains of a headache, bilateral elbow pain, and bilateral knee pain.    Past Medical History  Diagnosis Date  . Anxiety   . Acid reflux   . Constipation   . Muscular dystrophy (HCC)   . Hydrocephalus     There are no active problems to display for this patient.   Past Surgical History  Procedure Laterality Date  . Shunt in head      Current Outpatient Rx  Name  Route  Sig  Dispense  Refill  . acetaminophen (TYLENOL) 325 MG tablet   Oral   Take 650 mg by mouth every 4 (four) hours as needed.         Marland Kitchen amoxicillin (AMOXIL) 500 MG capsule   Oral   Take 500 mg by mouth as needed.         . chlorhexidine (PERIOGARD) 0.12 % solution   Mouth/Throat   Use as directed 15 mLs in the mouth or throat 2 (two) times daily.         Marland Kitchen FLUoxetine (PROZAC) 20 MG capsule   Oral   Take 20 mg by mouth daily.         Marland Kitchen lidocaine (XYLOCAINE) 2 % solution   Mouth/Throat   Use as directed 15 mLs in the mouth or throat every 8 (eight) hours as needed (sore throat. gargle and spit as needed for sore throat.).   85 mL   0   . EXPIRED: loratadine (CLARITIN) 10 MG tablet   Oral   Take 1 tablet (10 mg total) by mouth daily.   14 tablet   0   . LORazepam (ATIVAN) 0.5 MG tablet   Oral   Take 0.5 mg by mouth as needed for anxiety.          Marland Kitchen LORazepam (ATIVAN) 1 MG tablet   Oral   Take 1 mg by mouth as needed for anxiety.         . medroxyPROGESTERone (DEPO-PROVERA) 150 MG/ML injection   Intramuscular   Inject 150 mg into the muscle every 3 (three) months.         . meloxicam (MOBIC) 7.5 MG tablet   Oral   Take 7.5 mg by mouth daily.         . Multiple Vitamins-Minerals (MULTIVITAMIN PO)   Oral   Take by mouth daily.         . naproxen (NAPROSYN) 375 MG tablet   Oral   Take 375 mg by mouth 2 (two) times daily with a meal.         . Neomycin-Polymyxin-Dexameth (MAXITROL OP)   Ophthalmic   Apply to eye. Apply three times daily to right eye, then taper weekly         . omeprazole (PRILOSEC) 20 MG capsule   Oral   Take 20 mg by mouth daily.         Marland Kitchen  ondansetron (ZOFRAN) 8 MG tablet   Oral   Take by mouth 2 (two) times daily.         . Pseudoeph-Bromphen-DM (ROBITUSSIN ALLERGY/COUGH PO)   Oral   Take by mouth as needed.         . Senna (SENNTAB PO)   Oral   Take by mouth 2 (two) times daily.         . Sod Fluoride-Potassium Nitrate (PREVIDENT 5000 SENSITIVE DT)   dental   Place onto teeth. Use as directed           Allergies Review of patient's allergies indicates no known allergies.  History reviewed. No pertinent family history.  Social History Social History  Substance Use Topics  . Smoking status: Never Smoker   . Smokeless tobacco: Never Used  . Alcohol Use: No    Review of Systems Constitutional: No fever/chills. Eyes: No visual changes. Respiratory: Denies shortness of breath. Gastrointestinal: No abdominal pain.  No nausea, no vomiting. Musculoskeletal: Negative for pain. Skin: Negative for rash, lesion or wound Neurological:Positive for headache, negative for focal weakness or numbness. No confusion or fainting. ___________________________________________   PHYSICAL EXAM:  VITAL SIGNS: ED Triage Vitals  Enc Vitals Group     BP 01/03/16 1132  146/71 mmHg     Pulse Rate 01/03/16 1132 82     Resp 01/03/16 1132 18     Temp 01/03/16 1132 98.3 F (36.8 C)     Temp Source 01/03/16 1132 Oral     SpO2 01/03/16 1132 100 %     Weight 01/03/16 1132 120 lb (54.432 kg)     Height 01/03/16 1132  (1.6 m)     Head Cir --      Peak Flow --      Pain Score --      Pain Loc --      Pain Edu? --      Excl. in GC? --     Constitutional: Alert and oriented. Well appearing and in no acute distress. Eyes: Conjunctivae are normal. PERRL. EOMI. Head: Hematoma noted posterior parietal area. Nose: No congestion/rhinnorhea/epistaxis. Mouth/Throat: Mucous membranes are moist.  Oropharynx non-erythematous. Neck: No stridor. No meningismus. Nexus criteria negative. Cardiovascular: Normal rate, regular rhythm. Grossly normal heart sounds.  Good peripheral circulation. Respiratory: Normal respiratory effort.  No retractions. Lungs CTAB. Musculoskeletal: No lower extremity tenderness nor edema.  No joint effusions. FROM of bilateral elbows and knees. Neurologic:  Normal speech and language. No gross focal neurologic deficits are appreciated. No gait instability. Cranial nerves: 2-10 normal as tested. Cerebellar:Normal Romberg, finger-nose-finger, normal gait. Sensorimotor: No aphasia, pronator drift, clonus, sensory loss or abnormal reflexes.  Skin:  Skin is warm, dry and intact. No rash noted. Early contusion noted to the right elbow Psychiatric: Mood and affect are normal. Speech and behavior are normal. Normal thought process and cognition.  ____________________________________________   LABS (all labs ordered are listed, but only abnormal results are displayed)  Labs Reviewed - No data to display ____________________________________________  EKG   ____________________________________________  RADIOLOGY  CT head and cervical spine negative for acute abnormality per  radiology. ____________________________________________   PROCEDURES  Procedure(s) performed: None  Critical Care performed: No  ____________________________________________   INITIAL IMPRESSION / ASSESSMENT AND PLAN / ED COURSE  Pertinent labs & imaging results that were available during my care of the patient were reviewed by me and considered in my medical decision making (see chart for details). Caregivers were advised to give  tylenol prn for headache.  Patient was advised to follow up with the primary care provider for symptoms that are not relieved or improved over the next 24 hours. Also advised to return to the emergency department for symptoms that change or worsen if unable to schedule an appointment. ____________________________________________   FINAL CLINICAL IMPRESSION(S) / ED DIAGNOSES  Final diagnoses:  Acute post-traumatic headache, not intractable  Hematoma of scalp, initial encounter  Contusion  Minor head injury, initial encounter     Chinita PesterCari B Caylynn Minchew, FNP 01/03/16 1259  Minna AntisKevin Paduchowski, MD 01/03/16 1503

## 2016-01-03 NOTE — ED Notes (Signed)
Patient asks frequent questions and will repeat questions and sentences occasionally, per caregiver, this is patient's baseline.

## 2016-01-03 NOTE — ED Notes (Addendum)
Pt presents to ED s/p fall while dancing around 1030 am.  Pt fell backwards and hit back of her head on the concrete floor.  Pt states her shoe came off.  Visitor states she lost her footing which caused her fall and was able to speak with the patient during the fall. Caregiver states she did not loss consciousness.  Patient is talkative in triage and able to answer most questions.  Pt lives at a Group Home[facility] with [facility]. Patient is not on any blood thinners and has no neurological deficits that are new.  Patient already has some mental disabilities, but per caregiver the patient is at her baseline.

## 2016-01-03 NOTE — Discharge Instructions (Signed)
Give tylenol every 4 hours if needed for headache. Follow up with the primary care provider for headache that continues for more than 24 hours. Return to the ER for symptoms that change or worsen if unable to schedule an appointment.

## 2016-01-24 ENCOUNTER — Ambulatory Visit: Payer: Medicaid Other

## 2016-01-24 ENCOUNTER — Ambulatory Visit
Admission: EM | Admit: 2016-01-24 | Discharge: 2016-01-24 | Disposition: A | Discharge status: Home / Self Care | Payer: Medicaid Other | Attending: Family Medicine | Admitting: Family Medicine

## 2016-01-24 DIAGNOSIS — R0789 Other chest pain: Secondary | ICD-10-CM | POA: Diagnosis not present

## 2016-01-24 DIAGNOSIS — F419 Anxiety disorder, unspecified: Secondary | ICD-10-CM | POA: Insufficient documentation

## 2016-01-24 DIAGNOSIS — K219 Gastro-esophageal reflux disease without esophagitis: Secondary | ICD-10-CM | POA: Diagnosis not present

## 2016-01-24 DIAGNOSIS — G710 Muscular dystrophy: Secondary | ICD-10-CM | POA: Diagnosis not present

## 2016-01-24 DIAGNOSIS — M940 Chondrocostal junction syndrome [Tietze]: Secondary | ICD-10-CM | POA: Diagnosis not present

## 2016-01-24 DIAGNOSIS — R072 Precordial pain: Secondary | ICD-10-CM | POA: Diagnosis present

## 2016-01-24 DIAGNOSIS — G919 Hydrocephalus, unspecified: Secondary | ICD-10-CM | POA: Insufficient documentation

## 2016-01-24 MED ORDER — MELOXICAM 15 MG PO TABS: 15.0000 mg | ORAL_TABLET | Freq: Every day | ORAL | Status: AC

## 2016-01-24 NOTE — Discharge Instructions (Signed)
Chest Wall Pain °Chest wall pain is pain in or around the bones and muscles of your chest. Sometimes, an injury causes this pain. Sometimes, the cause may not be known. This pain may take several weeks or longer to get better. °HOME CARE °Pay attention to any changes in your symptoms. Take these actions to help with your pain: °· Rest as told by your doctor. °· Avoid activities that cause pain. Try not to use your chest, belly (abdominal), or side muscles to lift heavy things. °· If directed, apply ice to the painful area: °¨ Put ice in a plastic bag. °¨ Place a towel between your skin and the bag. °¨ Leave the ice on for 20 minutes, 2-3 times per day. °· Take over-the-counter and prescription medicines only as told by your doctor. °· Do not use tobacco products, including cigarettes, chewing tobacco, and e-cigarettes. If you need help quitting, ask your doctor. °· Keep all follow-up visits as told by your doctor. This is important. °GET HELP IF: °· You have a fever. °· Your chest pain gets worse. °· You have new symptoms. °GET HELP RIGHT AWAY IF: °· You feel sick to your stomach (nauseous) or you throw up (vomit). °· You feel sweaty or light-headed. °· You have a cough with phlegm (sputum) or you cough up blood. °· You are short of breath. °  °This information is not intended to replace advice given to you by your health care provider. Make sure you discuss any questions you have with your health care provider. °  °Document Released: 01/27/2008 Document Revised: 05/01/2015 Document Reviewed: 11/05/2014 °Elsevier Interactive Patient Education ©2016 [employer] ° °Costochondritis °Costochondritis is a condition in which the tissue (cartilage) that connects your ribs with your breastbone (sternum) becomes irritated. It causes pain in the chest and rib area. It usually goes away on its own over time. °HOME CARE °· Avoid activities that wear you out. °· Do not strain your ribs. Avoid activities that use  your: °¨ Chest. °¨ Belly. °¨ Side muscles. °· Put ice on the area for the first 2 days after the pain starts. °¨ Put ice in a plastic bag. °¨ Place a towel between your skin and the bag. °¨ Leave the ice on for 20 minutes, 2-3 times a day. °· Only take medicine as told by your doctor. °GET HELP IF: °· You have redness or puffiness (swelling) in the rib area. °· Your pain does not go away with rest or medicine. °GET HELP RIGHT AWAY IF:  °· Your pain gets worse. °· You are very uncomfortable. °· You have trouble breathing. °· You cough up blood. °· You start sweating or throwing up (vomiting). °· You have a fever or lasting symptoms for more than 2-3 days. °· You have a fever and your symptoms suddenly get worse. °MAKE SURE YOU:  °· Understand these instructions. °· Will watch your condition. °· Will get help right away if you are not doing well or get worse. °  °This information is not intended to replace advice given to you by your health care provider. Make sure you discuss any questions you have with your health care provider. °  °Document Released: 01/27/2008 Document Revised: 04/12/2013 Document Reviewed: 03/14/2013 °Elsevier Interactive Patient Education ©2016 [employer]. ° °

## 2016-01-24 NOTE — ED Provider Notes (Signed)
CSN: [id number]     Arrival date & time 01/24/16  1047 History   First MD Initiated Contact with Patient 01/24/16 1121    Nurses notes were reviewed. Chief Complaint  Patient presents with  . Chest Pain    Pt reports chest pain starting today. Reports hx of indigestion.  Pain is mild and non radiating. Reports she has had a recent fall but has been seen by Dr. since then.    A she has a developed chest pain this morning. She was doing her usual activity. 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 Day history of constipation and incontinence of bowels. She also has history of anxiety. She does ever smoked but she is also had a history of costochondritis her caregiver believes in the past.  She's had a history of a head injury about 2-3 weeks ago was seen in the ED.   (Consider location/radiation/quality/duration/timing/severity/associated sxs/prior Treatment) Patient is a 40 y.o. female presenting with chest pain. The history is provided by the patient and a caregiver. The history is limited by a developmental delay. No language interpreter was used.  Chest Pain Pain location:  Substernal area Pain quality: not crushing, no pressure, not radiating, not stabbing and not throbbing   Pain radiates to:  Does not radiate Pain radiates to the back: no   Pain severity:  Moderate Onset quality:  Sudden Progression:  Worsening Chronicity:  New Relieved by:  Nothing Associated symptoms: no abdominal pain, no AICD problem, no altered mental status, no dizziness and no dysphagia     Past Medical History  Diagnosis Date  . Anxiety   . Acid reflux   . Constipation   . Muscular dystrophy (HCC)   . Hydrocephalus    Past Surgical History  Procedure Laterality Date  . Shunt in head     History reviewed. No pertinent family history. Social History  Substance Use Topics  . Smoking status: Never Smoker   . Smokeless tobacco: Never Used  . Alcohol Use:  No   OB History    No data available     Review of Systems  Reason unable to perform ROS: History neurological/mental health impairment.  HENT: Negative for trouble swallowing.   Cardiovascular: Positive for chest pain.  Gastrointestinal: Negative for abdominal pain.  Neurological: Negative for dizziness.    Allergies  Belladonna alkaloids  Home Medications   Prior to Admission medications   Medication Sig Start Date End Date Taking? Authorizing Provider  acetaminophen (TYLENOL) 325 MG tablet Take 650 mg by mouth every 4 (four) hours as needed.   Yes Historical Provider, MD  chlorhexidine (PERIOGARD) 0.12 % solution Use as directed 15 mLs in the mouth or throat 2 (two) times daily.   Yes Historical Provider, MD  FLUoxetine (PROZAC) 20 MG capsule Take 20 mg by mouth daily.   Yes Historical Provider, MD  lidocaine (XYLOCAINE) 2 % solution Use as directed 15 mLs in the mouth or throat every 8 (eight) hours as needed (sore throat. gargle and spit as needed for sore throat.). 10/22/15  Yes Renford DillsLindsey Miller, NP  LORazepam (ATIVAN) 1 MG tablet Take 1 mg by mouth as needed for anxiety.   Yes Historical Provider, MD  medroxyPROGESTERone (DEPO-PROVERA) 150 MG/ML injection Inject 150 mg into the muscle every 3 (three) months.   Yes Historical Provider, MD  Multiple Vitamins-Minerals (MULTIVITAMIN PO) Take by mouth daily.   Yes Historical Provider, MD  Neomycin-Polymyxin-Dexameth (MAXITROL OP) Apply to eye. Apply  three times daily to right eye, then taper weekly   Yes Historical Provider, MD  omeprazole (PRILOSEC) 20 MG capsule Take 20 mg by mouth daily.   Yes Historical Provider, MD  Senna (SENNTAB PO) Take by mouth 2 (two) times daily.   Yes Historical Provider, MD  Sod Fluoride-Potassium Nitrate (PREVIDENT 5000 SENSITIVE DT) Place onto teeth. Use as directed   Yes Historical Provider, MD  amoxicillin (AMOXIL) 500 MG capsule Take 500 mg by mouth as needed.    Historical Provider, MD  loratadine  (CLARITIN) 10 MG tablet Take 1 tablet (10 mg total) by mouth daily. 10/22/15 11/05/15  Renford Dills, NP  LORazepam (ATIVAN) 0.5 MG tablet Take 0.5 mg by mouth as needed for anxiety.    Historical Provider, MD  meloxicam (MOBIC) 15 MG tablet Take 1 tablet (15 mg total) by mouth daily. 01/24/16   Hassan Rowan, MD  naproxen (NAPROSYN) 375 MG tablet Take 375 mg by mouth 2 (two) times daily with a meal.    Historical Provider, MD  ondansetron (ZOFRAN) 8 MG tablet Take by mouth 2 (two) times daily.    Historical Provider, MD  Pseudoeph-Bromphen-DM (ROBITUSSIN ALLERGY/COUGH PO) Take by mouth as needed.    Historical Provider, MD   Meds Ordered and Administered this Visit  Medications - No data to display  BP 136/82 mmHg  Pulse 75  Temp(Src) 98 F (36.7 C) (Oral)  Resp 20  Wt 141 lb (63.957 kg)  SpO2 100%  LMP  No data found.   Physical Exam  Constitutional: She appears well-developed and well-nourished.  HENT:  Head: Normocephalic and atraumatic.  Eyes: Pupils are equal, round, and reactive to light.  Neck: Normal range of motion. Neck supple.  Cardiovascular: Normal rate, regular rhythm and normal heart sounds.   Pulmonary/Chest: Effort normal and breath sounds normal.    Chest wall tenderness present tenderness along the entire sternum`  Abdominal: Soft. Bowel sounds are normal. She exhibits no distension. There is no tenderness.  Neurological: She is alert.  Skin: Skin is warm.  Psychiatric: She has a normal mood and affect.  Vitals reviewed.   ED Course  Procedures (including critical care time)  Labs Review Labs Reviewed - No data to display  Imaging Review Dg Chest 2 View  01/24/2016  CLINICAL DATA:  Mid sternal chest pain, 1 day duration. EXAM: CHEST  2 VIEW COMPARISON:  11/21/2012 FINDINGS: Heart size is normal. Mediastinal shadows are normal. The vascularity is normal. The lungs are clear. No effusions. Mild chronic curvature the spine. Shunt tube overlies the right  chest, unremarkable in appearance. IMPRESSION: No active cardiopulmonary disease. Electronically Signed   By: Paulina Fusi M.D.   On: 01/24/2016 12:30     Visual Acuity Review  Right Eye Distance:   Left Eye Distance:   Bilateral Distance:    Right Eye Near:   Left Eye Near:    Bilateral Near:         MDM   1. Acute chest wall pain   2. Costochondritis, acute       ED ECG REPORT I, Rishit Burkhalter H, the attending physician, personally viewed and interpreted this ECG.   Date: 01/24/2016  EKG Time: 11:18:44  Rate: 79  Rhythm: normal EKG, normal sinus rhythm, there are no previous tracings available for comparison  Axis: 0  Intervals:none  ST&T Change: none  Patient's findings are all consistent with costochondritis. We'll place on Mobic 15 mg 1 tablet a day and follow-up PCP in  about 2 weeks. Will get chest x-ray to make sure nothing is   Hassan Rowan, MD 01/24/16 [phone]

## 2016-03-25 ENCOUNTER — Ambulatory Visit: Payer: Medicaid Other | Admitting: Podiatry

## 2016-03-31 ENCOUNTER — Encounter: Payer: Self-pay | Admitting: Podiatry

## 2016-03-31 ENCOUNTER — Ambulatory Visit (INDEPENDENT_AMBULATORY_CARE_PROVIDER_SITE_OTHER): Payer: Medicaid Other | Admitting: Podiatry

## 2016-03-31 DIAGNOSIS — B351 Tinea unguium: Secondary | ICD-10-CM | POA: Diagnosis not present

## 2016-03-31 DIAGNOSIS — M79676 Pain in unspecified toe(s): Secondary | ICD-10-CM | POA: Diagnosis not present

## 2016-03-31 NOTE — Progress Notes (Signed)
Subjective: 40 y.o. returns the office today for painful, elongated, thickened toenails which she cannot trim herself. Denies any redness or drainage around the nails. Denies any acute changes since last appointment and no new complaints today. Denies any systemic complaints such as fevers, chills, nausea, vomiting.   Objective: NAD DP/PT pulses palpable, CRT less than 3 seconds Nails hypertrophic, dystrophic, elongated, brittle, discolored 10. There is tenderness overlying the nails 1-5 bilaterally. There is no surrounding erythema or drainage along the nail sites. No open lesions or pre-ulcerative lesions are identified. No other areas of tenderness bilateral lower extremities. No overlying edema, erythema, increased warmth. No pain with calf compression, swelling, warmth, erythema.  Assessment: Patient presents with symptomatic onychomycosis  Plan: -Treatment options including alternatives, risks, complications were discussed -Nails sharply debrided 10 without complication/bleeding. -Discussed daily foot inspection. If there are any changes, to call the office immediately.  -Follow-up in 3 months or sooner if any problems are to arise. In the meantime, encouraged to call the office with any questions, concerns, changes symptoms.  Ovid CurdMatthew Lakeria Starkman, DPM

## 2016-07-02 ENCOUNTER — Encounter: Payer: Self-pay | Admitting: Podiatry

## 2016-07-02 ENCOUNTER — Ambulatory Visit (INDEPENDENT_AMBULATORY_CARE_PROVIDER_SITE_OTHER): Payer: Medicaid Other | Admitting: Podiatry

## 2016-07-02 DIAGNOSIS — M79676 Pain in unspecified toe(s): Secondary | ICD-10-CM

## 2016-07-02 DIAGNOSIS — B351 Tinea unguium: Secondary | ICD-10-CM

## 2016-07-02 NOTE — Progress Notes (Signed)
Subjective: 40 y.o. returns the office today for painful, elongated, thickened toenails which she cannot trim herself. Denies any redness or drainage around the nails. Denies any acute changes since last appointment and no new complaints today. Denies any systemic complaints such as fevers, chills, nausea, vomiting.   Objective: NAD; presents with caregiver  DP/PT pulses palpable, CRT less than 3 seconds Nails hypertrophic, dystrophic, elongated, brittle, discolored 10. There is tenderness overlying the nails 1-5 bilaterally. There is no surrounding erythema or drainage along the nail sites. No open lesions or pre-ulcerative lesions are identified. No other areas of tenderness bilateral lower extremities. No overlying edema, erythema, increased warmth. No pain with calf compression, swelling, warmth, erythema.  Assessment: Patient presents with symptomatic onychomycosis  Plan: -Treatment options including alternatives, risks, complications were discussed -Nails sharply debrided 10 without complication/bleeding. -Discussed daily foot inspection. If there are any changes, to call the office immediately.  -Follow-up in 3 months or sooner if any problems are to arise. In the meantime, encouraged to call the office with any questions, concerns, changes symptoms.  Ovid CurdMatthew Numa Heatwole, DPM

## 2016-10-02 ENCOUNTER — Ambulatory Visit: Payer: Medicaid Other | Admitting: Podiatry

## 2016-10-05 ENCOUNTER — Ambulatory Visit (INDEPENDENT_AMBULATORY_CARE_PROVIDER_SITE_OTHER): Payer: Medicaid Other | Admitting: Podiatry

## 2016-10-05 ENCOUNTER — Encounter: Payer: Self-pay | Admitting: Podiatry

## 2016-10-05 DIAGNOSIS — B351 Tinea unguium: Secondary | ICD-10-CM

## 2016-10-05 DIAGNOSIS — M79676 Pain in unspecified toe(s): Secondary | ICD-10-CM | POA: Diagnosis not present

## 2016-10-05 NOTE — Progress Notes (Signed)
Complaint:  Visit Type: Patient returns to my office for continued preventative foot care services. Complaint: Patient states" my nails have grown long and thick and become painful to walk and wear shoes"  The patient presents for preventative foot care services. No changes to ROS.  She also says she experiences pain on the inside left foot.  Podiatric Exam: Vascular: dorsalis pedis and posterior tibial pulses are palpable bilateral. Capillary return is immediate. Temperature gradient is WNL. Skin turgor WNL  Sensorium: Normal Semmes Weinstein monofilament test. Normal tactile sensation bilaterally. Nail Exam: Pt has thick disfigured discolored nails with subungual debris noted bilateral entire nail hallux through fifth toenails Ulcer Exam: There is no evidence of ulcer or pre-ulcerative changes or infection. Orthopedic Exam: Muscle tone and strength are WNL. No limitations in general ROM. No crepitus or effusions noted. Foot type and digits show no abnormalities. Bony prominences are unremarkable. Skin: No Porokeratosis. No infection or ulcers  Diagnosis:  Onychomycosis, , Pain in right toe, pain in left toes  Treatment & Plan Procedures and Treatment: Consent by patient was obtained for treatment procedures. The patient understood the discussion of treatment and procedures well. All questions were answered thoroughly reviewed. Debridement of mycotic and hypertrophic toenails, 1 through 5 bilateral and clearing of subungual debris. No ulceration, no infection noted. Added padding left brace. Return Visit-Office Procedure: Patient instructed to return to the office for a follow up visit 3 months for continued evaluation and treatment.    Helane GuntherGregory Windsor Zirkelbach DPM

## 2016-10-15 DIAGNOSIS — H269 Unspecified cataract: Secondary | ICD-10-CM | POA: Insufficient documentation

## 2016-10-22 DIAGNOSIS — K219 Gastro-esophageal reflux disease without esophagitis: Secondary | ICD-10-CM | POA: Insufficient documentation

## 2016-10-22 DIAGNOSIS — F79 Unspecified intellectual disabilities: Secondary | ICD-10-CM | POA: Insufficient documentation

## 2016-10-22 DIAGNOSIS — F418 Other specified anxiety disorders: Secondary | ICD-10-CM | POA: Insufficient documentation

## 2016-11-06 DIAGNOSIS — Z961 Presence of intraocular lens: Secondary | ICD-10-CM | POA: Insufficient documentation

## 2016-12-24 ENCOUNTER — Emergency Department
Admission: EM | Admit: 2016-12-24 | Discharge: 2016-12-24 | Disposition: A | Discharge status: Home / Self Care | Payer: Worker's Compensation | Attending: Emergency Medicine | Admitting: Emergency Medicine

## 2016-12-24 ENCOUNTER — Emergency Department: Payer: Worker's Compensation

## 2016-12-24 DIAGNOSIS — Y929 Unspecified place or not applicable: Secondary | ICD-10-CM | POA: Diagnosis not present

## 2016-12-24 DIAGNOSIS — Y939 Activity, unspecified: Secondary | ICD-10-CM | POA: Insufficient documentation

## 2016-12-24 DIAGNOSIS — W01198A Fall on same level from slipping, tripping and stumbling with subsequent striking against other object, initial encounter: Secondary | ICD-10-CM | POA: Insufficient documentation

## 2016-12-24 DIAGNOSIS — S8991XA Unspecified injury of right lower leg, initial encounter: Secondary | ICD-10-CM | POA: Diagnosis present

## 2016-12-24 DIAGNOSIS — Y999 Unspecified external cause status: Secondary | ICD-10-CM | POA: Insufficient documentation

## 2016-12-24 MED ORDER — IBUPROFEN 200 MG PO TABS: 400.0000 mg | ORAL_TABLET | Freq: Three times a day (TID) | ORAL | 1 refills | Status: AC | PRN

## 2016-12-24 NOTE — ED Triage Notes (Signed)
Pt arrived to ED via EMS after falling over curb and hitting right knee. No deformities. Pt able to stand and pivit. Small amount of swelling noted. Pt denies having hit head. No LOC.

## 2016-12-24 NOTE — ED Provider Notes (Signed)
[facility] Emergency Department Provider Note  ____________________________________________  Time seen: Approximately 3:22 PM  I have reviewed the triage vital signs and the nursing notes.   HISTORY  Chief Complaint Knee Pain    HPI Lindsey Allison is a 41 y.o. female that presents to emergency department with right knee after tripping on a curb.Patient has been walking since fall. She states that it is not painful to bend or move. She has scratches on her elbows and on her knee. She denies hitting head or losing consciousness. She denies shortness of breath, nausea, vomiting, abdominal pain.   Past Medical History:  Diagnosis Date  . Acid reflux   . Anxiety   . Constipation   . Hydrocephalus   . Muscular dystrophy (HCC)     There are no active problems to display for this patient.   Past Surgical History:  Procedure Laterality Date  . shunt in head      Prior to Admission medications   Medication Sig Start Date End Date Taking? Authorizing Provider  acetaminophen (TYLENOL) 325 MG tablet Take 650 mg by mouth every 4 (four) hours as needed.    Historical Provider, MD  chlorhexidine (PERIOGARD) 0.12 % solution Use as directed 15 mLs in the mouth or throat 2 (two) times daily.    Historical Provider, MD  FLUoxetine (PROZAC) 20 MG capsule Take 20 mg by mouth daily.    Historical Provider, MD  ibuprofen (MOTRIN IB) 200 MG tablet Take 2 tablets (400 mg total) by mouth every 8 (eight) hours as needed. 12/24/16   Enid Derry, PA-C  loratadine (CLARITIN) 10 MG tablet Take 1 tablet (10 mg total) by mouth daily. 10/22/15 11/05/15  Renford Dills, NP  LORazepam (ATIVAN) 0.5 MG tablet Take 0.5 mg by mouth as needed for anxiety.    Historical Provider, MD  LORazepam (ATIVAN) 1 MG tablet Take 1 mg by mouth as needed for anxiety.    Historical Provider, MD  medroxyPROGESTERone (DEPO-PROVERA) 150 MG/ML injection Inject 150 mg into the muscle every 3 (three)  months.    Historical Provider, MD  meloxicam (MOBIC) 15 MG tablet Take 1 tablet (15 mg total) by mouth daily. 01/24/16   Hassan Rowan, MD  Multiple Vitamins-Minerals (MULTIVITAMIN PO) Take by mouth daily.    Historical Provider, MD  naproxen (NAPROSYN) 375 MG tablet Take 375 mg by mouth 2 (two) times daily with a meal.    Historical Provider, MD  Neomycin-Polymyxin-Dexameth (MAXITROL OP) Apply to eye. Apply three times daily to right eye, then taper weekly    Historical Provider, MD  omeprazole (PRILOSEC) 20 MG capsule Take 20 mg by mouth daily.    Historical Provider, MD  ondansetron (ZOFRAN) 8 MG tablet Take by mouth 2 (two) times daily.    Historical Provider, MD  Pseudoeph-Bromphen-DM (ROBITUSSIN ALLERGY/COUGH PO) Take by mouth as needed.    Historical Provider, MD  Senna (SENNTAB PO) Take by mouth 2 (two) times daily.    Historical Provider, MD  Sod Fluoride-Potassium Nitrate (PREVIDENT 5000 SENSITIVE DT) Place onto teeth. Use as directed    Historical Provider, MD    Allergies Belladonna alkaloids  No family history on file.  Social History Social History  Substance Use Topics  . Smoking status: Never Smoker  . Smokeless tobacco: Never Used  . Alcohol use No     Review of Systems  Constitutional: No fever/chills Cardiovascular: No chest pain. Respiratory:  No SOB. Gastrointestinal: No abdominal pain.  No nausea, no vomiting.  Skin: Negative for rash, lacerations, ecchymosis.   ____________________________________________   PHYSICAL EXAM:  VITAL SIGNS: ED Triage Vitals  Enc Vitals Group     BP 12/24/16 1506 (!) 143/72     Pulse Rate 12/24/16 1506 82     Resp 12/24/16 1506 18     Temp 12/24/16 1506 98.6 F (37 C)     Temp Source 12/24/16 1506 Oral     SpO2 12/24/16 1506 100 %     Weight 12/24/16 1507 145 lb (65.8 kg)     Height 12/24/16 1507 5\' 3"  (1.6 m)     Head Circumference --      Peak Flow --      Pain Score --      Pain Loc --      Pain Edu? --       Excl. in GC? --      Constitutional: Alert and oriented. Well appearing and in no acute distress. Eyes: Conjunctivae are normal. PERRL. EOMI. Head: Atraumatic. ENT:      Ears:      Nose: No congestion/rhinnorhea.      Mouth/Throat: Mucous membranes are moist.  Neck: No stridor.   Cardiovascular: Normal rate, regular rhythm.  Good peripheral circulation. Respiratory: Normal respiratory effort without tachypnea or retractions. Lungs CTAB. Good air entry to the bases with no decreased or absent breath sounds. Musculoskeletal: Full range of motion to all extremities. No gross deformities appreciated. Full range of motion of right knee. No tenderness to palpation. Mild swelling. Neurologic: Cognitive delay. No gross focal neurologic deficits are appreciated.  Skin:  Skin is warm, dry. Abrasions to both elbows and right knee. No bleeding.  ____________________________________________   LABS (all labs ordered are listed, but only abnormal results are displayed)  Labs Reviewed - No data to display ____________________________________________  EKG   ____________________________________________  RADIOLOGY Lexine Baton, personally viewed and evaluated these images (plain radiographs) as part of my medical decision making, as well as reviewing the written report by the radiologist.  Dg Knee Complete 4 Views Right  Result Date: 12/24/2016 CLINICAL DATA:  Pt arrived to ED via EMS after falling over curb and hitting right knee, history of hydrocephalus, muscular dystrophy, best possible images obtained, unable to remove bandages from lower leg EXAM: RIGHT KNEE - COMPLETE 4+ VIEW COMPARISON:  None. FINDINGS: There is significant soft tissue swelling along the lateral and anterior aspect of the knee. No acute fracture or traumatic subluxation. Trace joint effusion. IMPRESSION: Soft tissue swelling and trace joint effusion.  No fracture.  The Electronically Signed   By: Norva Pavlov M.D.    On: 12/24/2016 16:08    ____________________________________________    PROCEDURES  Procedure(s) performed:    Procedures    Medications - No data to display   ____________________________________________   INITIAL IMPRESSION / ASSESSMENT AND PLAN / ED COURSE  Pertinent labs & imaging results that were available during my care of the patient were reviewed by me and considered in my medical decision making (see chart for details).  Review of the [place] CSRS was performed in accordance of the [facility] prior to dispensing any controlled drugs.     Patient's diagnosis is consistent with knee injury and trace joint effusion. Vital signs and exam are reassuring. X-ray consistent with trace joint effusion. Ace wrap was applied. Patient was instructed to limit weightbearing but I think crutches are more likely to cause additional injury due to cognitive delay. Education was provided. Patient will be discharged  home with prescriptions for ibuprofen. Patient is to follow up with PCP as directed. Patient is given ED precautions to return to the ED for any worsening or new symptoms.     ____________________________________________  FINAL CLINICAL IMPRESSION(S) / ED DIAGNOSES  Final diagnoses:  Injury of right knee, initial encounter      NEW MEDICATIONS STARTED DURING THIS VISIT:  Discharge Medication List as of 12/24/2016  4:50 PM    START taking these medications   Details  ibuprofen (MOTRIN IB) 200 MG tablet Take 2 tablets (400 mg total) by mouth every 8 (eight) hours as needed., Starting Thu 12/24/2016, Print            This chart was dictated using voice recognition software/Dragon. Despite best efforts to proofread, errors can occur which can change the meaning. Any change was purely unintentional.    Enid DerryAshley Syanna Remmert, PA-C 12/24/16 1720    Sharman CheekPhillip Stafford, MD 12/24/16 2252

## 2016-12-24 NOTE — Discharge Instructions (Signed)
Follow up with family doctor in 1 week Take 400mg  ibuprofen 3x day for pain and swelling Apply ice 3-4 times a day for 20 minutes Keep leg elevated when resting Limit weight bearing until seen by family doctor

## 2016-12-24 NOTE — ED Notes (Signed)
See triage note. States she tripped over a curb  Landing on right knee no deformity noted

## 2017-01-04 ENCOUNTER — Ambulatory Visit: Payer: Medicaid Other | Admitting: Podiatry

## 2017-01-12 ENCOUNTER — Ambulatory Visit
Admission: EM | Admit: 2017-01-12 | Discharge: 2017-01-12 | Disposition: A | Discharge status: Other Acute Inpatient Hospital | Payer: Medicaid Other | Attending: Family Medicine | Admitting: Family Medicine

## 2017-01-12 ENCOUNTER — Emergency Department: Payer: Medicaid Other

## 2017-01-12 ENCOUNTER — Encounter: Payer: Self-pay | Admitting: Emergency Medicine

## 2017-01-12 ENCOUNTER — Emergency Department
Admission: EM | Admit: 2017-01-12 | Discharge: 2017-01-12 | Disposition: A | Discharge status: Home / Self Care | Payer: Medicaid Other | Attending: Emergency Medicine | Admitting: Emergency Medicine

## 2017-01-12 DIAGNOSIS — K219 Gastro-esophageal reflux disease without esophagitis: Secondary | ICD-10-CM | POA: Diagnosis not present

## 2017-01-12 DIAGNOSIS — Z8719 Personal history of other diseases of the digestive system: Secondary | ICD-10-CM | POA: Diagnosis not present

## 2017-01-12 DIAGNOSIS — R079 Chest pain, unspecified: Secondary | ICD-10-CM | POA: Insufficient documentation

## 2017-01-12 DIAGNOSIS — R109 Unspecified abdominal pain: Secondary | ICD-10-CM

## 2017-01-12 LAB — BASIC METABOLIC PANEL
ANION GAP: 5 (ref 5–15)
BUN: 11 mg/dL (ref 6–20)
CALCIUM: 9.5 mg/dL (ref 8.9–10.3)
CO2: 29 mmol/L (ref 22–32)
Chloride: 108 mmol/L (ref 101–111)
Creatinine, Ser: 0.47 mg/dL (ref 0.44–1.00)
GFR calc Af Amer: >60 mL/min (ref >60)
GFR calc non Af Amer: >60 mL/min (ref >60)
GLUCOSE: 94 mg/dL (ref 65–99)
Potassium: 4 mmol/L (ref 3.5–5.1)
Sodium: 142 mmol/L (ref 135–145)

## 2017-01-12 LAB — CBC
HCT: 40.3 % (ref 35.0–47.0)
Hemoglobin: 13.2 g/dL (ref 12.0–16.0)
MCH: 29.8 pg (ref 26.0–34.0)
MCHC: 32.7 g/dL (ref 32.0–36.0)
MCV: 91.2 fL (ref 80.0–100.0)
PLATELETS: 285 10*3/uL (ref 150–440)
RBC: 4.42 MIL/uL (ref 3.80–5.20)
RDW: 14.6 % — AB (ref 11.5–14.5)
WBC: 15.5 10*3/uL — ABNORMAL HIGH (ref 3.6–11.0)

## 2017-01-12 LAB — TROPONIN I: Troponin I: <0.03 ng/mL (ref <0.03)

## 2017-01-12 MED ORDER — LIDOCAINE VISCOUS 2 % MT SOLN: 15.0000 mL | Freq: Once | OROMUCOSAL | Status: DC

## 2017-01-12 MED ORDER — LIDOCAINE VISCOUS 2 % MT SOLN
OROMUCOSAL | Status: AC
  Filled 2017-01-12: qty 15

## 2017-01-12 NOTE — ED Notes (Signed)
Per caregiver pt is unable to have lidocaine , states " [facility] patients are not allowed liodcaine", MD made awre

## 2017-01-12 NOTE — Discharge Instructions (Signed)
Please seek medical attention for any high fevers, chest pain, shortness of breath, change in behavior, persistent vomiting, bloody stool or any other new or concerning symptoms.  

## 2017-01-12 NOTE — ED Triage Notes (Signed)
Patient came in today for chest pain in the center of her chest where her tube is. 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 Paient states that pain started while eating lunch today and has been constant.

## 2017-01-12 NOTE — ED Notes (Signed)
Sandwich tray given at this time 

## 2017-01-12 NOTE — ED Triage Notes (Addendum)
Patient ambulatory to triage with steady gait, without difficulty or distress noted; pt reports today having mid CP, nonradiating with no accomp symptoms; [facility]

## 2017-01-12 NOTE — ED Notes (Signed)
[facility]

## 2017-01-12 NOTE — ED Provider Notes (Signed)
[facility] Emergency Department Provider Note   ____________________________________________   I have reviewed the triage vital signs and the nursing notes.   HISTORY  Chief Complaint Chest Pain   History limited by: Intellectual disability   HPI Lindsey Allison is a 41 y.o. female who presents to the emergency department today because of concerns for chest pain. She states this started this afternoon/evening. It was located in the lower middle chest. She describes it as sharp. She continues to have this pain now. The patient denies having similar pain in the past. She denies any recent illness, fevers, nausea vomiting or diarrhea.   Past Medical History:  Diagnosis Date  . Acid reflux   . Anxiety   . Constipation   . Hydrocephalus   . Muscular dystrophy (HCC)     There are no active problems to display for this patient.   Past Surgical History:  Procedure Laterality Date  . shunt in head      Prior to Admission medications   Medication Sig Start Date End Date Taking? Authorizing Provider  acetaminophen (TYLENOL) 325 MG tablet Take 650 mg by mouth every 4 (four) hours as needed.    [provider]  chlorhexidine (PERIOGARD) 0.12 % solution Use as directed 15 mLs in the mouth or throat 2 (two) times daily.    [provider]  FLUoxetine (PROZAC) 20 MG capsule Take 20 mg by mouth daily.    [provider]  ibuprofen (MOTRIN IB) 200 MG tablet Take 2 tablets (400 mg total) by mouth every 8 (eight) hours as needed. 12/24/16   Enid DerryWagner, Ashley, PA-C  loratadine (CLARITIN) 10 MG tablet Take 1 tablet (10 mg total) by mouth daily. 10/22/15 11/05/15  Renford DillsMiller, Lindsey, NP  LORazepam (ATIVAN) 0.5 MG tablet Take 0.5 mg by mouth as needed for anxiety.    [provider]  LORazepam (ATIVAN) 1 MG tablet Take 1 mg by mouth as needed for anxiety.    [provider]  medroxyPROGESTERone (DEPO-PROVERA) 150 MG/ML injection  Inject 150 mg into the muscle every 3 (three) months.    [provider]  meloxicam (MOBIC) 15 MG tablet Take 1 tablet (15 mg total) by mouth daily. 01/24/16   Hassan RowanWade, Eugene, MD  Multiple Vitamins-Minerals (MULTIVITAMIN PO) Take by mouth daily.    [provider]  naproxen (NAPROSYN) 375 MG tablet Take 375 mg by mouth 2 (two) times daily with a meal.    [provider]  Neomycin-Polymyxin-Dexameth (MAXITROL OP) Apply to eye. Apply three times daily to right eye, then taper weekly    [provider]  omeprazole (PRILOSEC) 20 MG capsule Take 20 mg by mouth daily.    [provider]  ondansetron (ZOFRAN) 8 MG tablet Take by mouth 2 (two) times daily.    [provider]  Pseudoeph-Bromphen-DM (ROBITUSSIN ALLERGY/COUGH PO) Take by mouth as needed.    [provider]  Senna (SENNTAB PO) Take by mouth 2 (two) times daily.    [provider]  Sod Fluoride-Potassium Nitrate (PREVIDENT 5000 SENSITIVE DT) Place onto teeth. Use as directed    [provider]    Allergies Belladonna alkaloids  No family history on file.  Social History Social History  Substance Use Topics  . Smoking status: Never Smoker  . Smokeless tobacco: Never Used  . Alcohol use No    Review of Systems Constitutional: No fever/chills Eyes: No visual changes. ENT: No sore throat. Cardiovascular: Positive chest pain. Respiratory: Denies  shortness of breath. Gastrointestinal: No abdominal pain.  No nausea, no vomiting.  No diarrhea.   Genitourinary: Negative for dysuria. Musculoskeletal: Negative for back pain. Skin: Negative for rash. Neurological: Negative for headaches, focal weakness or numbness.  ____________________________________________   PHYSICAL EXAM:  VITAL SIGNS: ED Triage Vitals  Enc Vitals Group     BP 01/12/17 1904 (!) 147/85     Pulse Rate 01/12/17 1904 85     Resp 01/12/17 1904 18     Temp 01/12/17 1904 98 F (36.7 C)      Temp src --      SpO2 01/12/17 1904 100 %     Weight 01/12/17 1902 145 lb (65.8 kg)     Height 01/12/17 1902 5\' 3"  (1.6 m)     Head Circumference --      Peak Flow --      Pain Score 01/12/17 1902 8   Constitutional: Alert and oriented. Well appearing and in no distress. Eyes: Conjunctivae are normal.  ENT   Head: Atraumatic.   Nose: No congestion/rhinnorhea.   Mouth/Throat: Mucous membranes are moist.   Neck: No stridor. Hematological/Lymphatic/Immunilogical: No cervical lymphadenopathy. Cardiovascular: Normal rate, regular rhythm.  No murmurs, rubs, or gallops. Respiratory: Normal respiratory effort without tachypnea nor retractions. Breath sounds are clear and equal bilaterally. No wheezes/rales/rhonchi. Gastrointestinal: Soft and non tender. No rebound. No guarding.  Genitourinary: Deferred Musculoskeletal: Normal range of motion in all extremities. No lower extremity edema. Neurologic:  Normal speech and language. No gross focal neurologic deficits are appreciated.  Skin:  Skin is warm, dry and intact. No rash noted. Psychiatric: Mood and affect are normal. Speech and behavior are normal. Patient exhibits appropriate insight and judgment.  ____________________________________________    LABS (pertinent positives/negatives)  Labs Reviewed  CBC - Abnormal; Notable for the following:       Result Value   WBC 15.5 (*)    RDW 14.6 (*)    All other components within normal limits  BASIC METABOLIC PANEL  TROPONIN I  TROPONIN I    ____________________________________________   EKG  I, Phineas Semen, attending physician, personally viewed and interpreted this EKG  EKG Time: 1905 Rate: 83 Rhythm: normal sinus rhythm Axis: normal Intervals: qtc 420 QRS: narrow ST changes: no st elevation Impression: normal ekg   ____________________________________________    RADIOLOGY  CXR IMPRESSION:  No active cardiopulmonary disease.       ____________________________________________   PROCEDURES  Procedures   ____________________________________________   INITIAL IMPRESSION / ASSESSMENT AND PLAN / ED COURSE  Pertinent labs & imaging results that were available during my care of the patient were reviewed by me and considered in my medical decision making (see chart for details).  Patient presented to the emergency department today from urgent care. Patient was sent over for repeat cardiac enzymes. Patient states she does have some pain in her lower central chest. First troponin negative.  Second troponin also negative. Patient states that her pain improved with eating. On discharge back to living facility to follow up with primary care.   ____________________________________________   FINAL CLINICAL IMPRESSION(S) / ED DIAGNOSES  Final diagnoses:  Nonspecific chest pain     Note: This dictation was prepared with Dragon dictation. Any transcriptional errors that result from this process are unintentional     Phineas Semen, MD 01/12/17 2248

## 2017-01-12 NOTE — ED Provider Notes (Signed)
[facility]    CSN: [id number] Arrival date & time: 01/12/17  1708     History   Chief Complaint Chief Complaint  Patient presents with  . Chest Pain    HPI Lindsey Allison is a 41 y.o. female.   Patient is a 56 year old white female who has cognitive issues. Because of her mental status she lives in a group home. Unfortunately she is not able to give Lindsey Allison much history of that she's having chest pain. When asked if this pain is like reflux pain initially his abdomen is not she has had a history of reflux but she is also taking Nexium every day. She also has chest wall pain tenderness but that does not feel like the pain that she's having. Because of her [name] difficulty she's has not had a stress test. She's had chest pain before and has been evaluated in a limited fashion before. She reports having chest pain lunch and because of pain continue they've recommended her to be evaluated in the staff at the group home brought her here. Unable to get a real good history from her as far as how intense the pain is now and whether she still hurting but EKG is normal   The history is provided by the patient and a caregiver. The history is limited by the condition of the patient. No language interpreter was used.  Chest Pain  Pain location:  Substernal area and epigastric Pain quality: aching, burning, pressure, sharp and shooting   Pain quality: not radiating   Pain radiates to:  Does not radiate Pain severity:  Moderate Onset quality:  Sudden Duration:  3 hours Timing:  Constant Progression:  Waxing and waning Chronicity:  New Context: breathing and movement   Relieved by:  Nothing Ineffective treatments:  Antacids Associated symptoms: abdominal pain     Past Medical History:  Diagnosis Date  . Acid reflux   . Anxiety   . Constipation   . Hydrocephalus   . Muscular dystrophy (HCC)     There are no active problems to display for this patient.   Past  Surgical History:  Procedure Laterality Date  . shunt in head      OB History    No data available       Home Medications    Prior to Admission medications   Medication Sig Start Date End Date Taking? Authorizing Provider  acetaminophen (TYLENOL) 325 MG tablet Take 650 mg by mouth every 4 (four) hours as needed.   Yes [provider]  chlorhexidine (PERIOGARD) 0.12 % solution Use as directed 15 mLs in the mouth or throat 2 (two) times daily.   Yes [provider]  FLUoxetine (PROZAC) 20 MG capsule Take 20 mg by mouth daily.   Yes [provider]  ibuprofen (MOTRIN IB) 200 MG tablet Take 2 tablets (400 mg total) by mouth every 8 (eight) hours as needed. 12/24/16  Yes Enid Derry, PA-C  LORazepam (ATIVAN) 0.5 MG tablet Take 0.5 mg by mouth as needed for anxiety.   Yes [provider]  LORazepam (ATIVAN) 1 MG tablet Take 1 mg by mouth as needed for anxiety.   Yes [provider]  medroxyPROGESTERone (DEPO-PROVERA) 150 MG/ML injection Inject 150 mg into the muscle every 3 (three) months.   Yes [provider]  meloxicam (MOBIC) 15 MG tablet Take 1 tablet (15 mg total) by mouth daily. 01/24/16  Yes Hassan Rowan, MD  Multiple Vitamins-Minerals (MULTIVITAMIN PO) Take  by mouth daily.   Yes [provider]  naproxen (NAPROSYN) 375 MG tablet Take 375 mg by mouth 2 (two) times daily with a meal.   Yes [provider]  Neomycin-Polymyxin-Dexameth (MAXITROL OP) Apply to eye. Apply three times daily to right eye, then taper weekly   Yes [provider]  omeprazole (PRILOSEC) 20 MG capsule Take 20 mg by mouth daily.   Yes [provider]  ondansetron (ZOFRAN) 8 MG tablet Take by mouth 2 (two) times daily.   Yes [provider]  Pseudoeph-Bromphen-DM (ROBITUSSIN ALLERGY/COUGH PO) Take by mouth as needed.   Yes [provider]  Senna (SENNTAB PO) Take by mouth 2 (two) times daily.   Yes [provider]  Sod Fluoride-Potassium Nitrate (PREVIDENT 5000 SENSITIVE DT) Place onto teeth. Use as directed   Yes [provider]  loratadine (CLARITIN) 10 MG tablet Take 1 tablet (10 mg total) by mouth daily. 10/22/15 11/05/15  Renford Dills, NP    Family History History reviewed. No pertinent family history.  Social History Social History  Substance Use Topics  . Smoking status: Never Smoker  . Smokeless tobacco: Never Used  . Alcohol use No     Allergies   Belladonna alkaloids   Review of Systems Review of Systems  Unable to perform ROS: Mental status change  Cardiovascular: Positive for chest pain.  Gastrointestinal: Positive for abdominal pain.     Physical Exam Triage Vital Signs ED Triage Vitals  Enc Vitals Group     BP --      Pulse Rate 01/12/17 1723 84     Resp --      Temp 01/12/17 1723 98.2 F (36.8 C)     Temp Source 01/12/17 1723 Oral     SpO2 --      Weight 01/12/17 1724 145 lb (65.8 kg)     Height --      Head Circumference --      Peak Flow --      Pain Score 01/12/17 1725 5     Pain Loc --      Pain Edu? --      Excl. in GC? --    No data found.   Updated Vital Signs Pulse 84   Temp 98.2 F (36.8 C) (Oral)   Wt 145 lb (65.8 kg)   BMI 25.69 kg/m   Visual Acuity Right Eye Distance:   Left Eye Distance:   Bilateral Distance:    Right Eye Near:   Left Eye Near:    Bilateral Near:     Physical Exam  Constitutional: No distress.  HENT:  Head: Normocephalic and atraumatic.  Right Ear: External ear normal.  Left Ear: External ear normal.  Eyes: Pupils are equal, round, and reactive to light.  Neck: Neck supple.  Cardiovascular: Normal rate and regular rhythm.   Pulmonary/Chest: Effort normal.  Abdominal: Soft.  Musculoskeletal:  Dear to ambulatory problems and patient unable to walk without help and assistance she has been fitted for braces for both feet  Neurological: She is alert.  Skin: Skin is warm. She is  not diaphoretic.  Psychiatric: She has a normal mood and affect.  Vitals reviewed.    UC Treatments / Results  Labs (all labs ordered are listed, but only abnormal results are displayed) Labs Reviewed - No data to display  EKG  EKG Interpretation None      ED ECG REPORT I, Edmundo Tedesco H, the attending physician, personally viewed  and interpreted this ECG.   Date: 01/12/2017  EKG Time:17:33:31  Rhythm: there are no previous tracings available for comparison, normal sinus rhythm  Axis: 8  Intervals:none  ST&T Change: none Radiology Dg Chest 2 View  Result Date: 01/12/2017 CLINICAL DATA:  41 y/o  F; mid chest pain. EXAM: CHEST  2 VIEW COMPARISON:  01/24/2016 chest radiograph FINDINGS: The heart size and mediastinal contours are within normal limits. Both lungs are clear. The visualized skeletal structures are unremarkable. Right ventriculoperitoneal shunt catheter is seen projecting over the right neck, chest wall, and upper abdomen. Right upper quadrant cholecystectomy clips. IMPRESSION: No active cardiopulmonary disease. Electronically Signed   By: Mitzi HansenLance  Furusawa-Stratton M.D.   On: 01/12/2017 19:31    Procedures Procedures (including critical care time)  Medications Ordered in UC Medications - No data to display   Initial Impression / Assessment and Plan / UC Course  I have reviewed the triage vital signs and the nursing notes.  Pertinent labs & imaging results that were available during my care of the patient were reviewed by me and considered in my medical decision making (see chart for details).    Patient EKG is stable so no problem with them taking her by car to the ER. Unfortunately because of her inability give a good history not feel comfortable just giving her GI cocktail sending her home. Phrase she probably will need to get serial enzymes and for that reason sending her to the ED of the choice [facility]. We'll have our nursing staff contact  [facility] tube informed him of her pending arrival  Also to try to get history from patient is difficult due to her  strong religious indocity     Final Clinical Impressions(s) / UC Diagnoses   Final diagnoses:  Chest pain, unspecified type  Hx of gastroesophageal reflux (GERD)    New Prescriptions Discharge Medication List as of 01/12/2017  6:18 PM    Note: This dictation was prepared with Dragon dictation along with smaller phrase technology. Any transcriptional errors that result from this process are unintentional.   Hassan RowanWade, Parker Sawatzky, MD 01/12/17 [phone]

## 2017-02-01 ENCOUNTER — Ambulatory Visit (INDEPENDENT_AMBULATORY_CARE_PROVIDER_SITE_OTHER): Payer: Medicaid Other | Admitting: Podiatry

## 2017-02-01 DIAGNOSIS — M79676 Pain in unspecified toe(s): Secondary | ICD-10-CM

## 2017-02-01 DIAGNOSIS — B351 Tinea unguium: Secondary | ICD-10-CM

## 2017-02-01 NOTE — Progress Notes (Signed)
Complaint:  Visit Type: Patient returns to my office for continued preventative foot care services. Complaint: Patient states" my nails have grown long and thick and become painful to walk and wear shoes"  The patient presents for preventative foot care services. No changes to ROS.  She also says she experiences pain on the inside left foot.  Podiatric Exam: Vascular: dorsalis pedis and posterior tibial pulses are palpable bilateral. Capillary return is immediate. Temperature gradient is WNL. Skin turgor WNL  Sensorium: Normal Semmes Weinstein monofilament test. Normal tactile sensation bilaterally. Nail Exam: Pt has thick disfigured discolored nails with subungual debris noted bilateral entire nail hallux through fifth toenails Ulcer Exam: There is no evidence of ulcer or pre-ulcerative changes or infection. Orthopedic Exam: Muscle tone and strength are WNL. No limitations in general ROM. No crepitus or effusions noted. Foot type and digits show no abnormalities. Bony prominences are unremarkable. Skin: No Porokeratosis. No infection or ulcers  Diagnosis:  Onychomycosis, , Pain in right toe, pain in left toes  Treatment & Plan Procedures and Treatment: Consent by patient was obtained for treatment procedures. The patient understood the discussion of treatment and procedures well. All questions were answered thoroughly reviewed. Debridement of mycotic and hypertrophic toenails, 1 through 5 bilateral and clearing of subungual debris. No ulceration, no infection noted. Added padding left brace. Return Visit-Office Procedure: Patient instructed to return to the office for a follow up visit 3 months for continued evaluation and treatment.    Helane GuntherGregory Leahmarie Gasiorowski DPM

## 2017-04-04 ENCOUNTER — Ambulatory Visit
Admission: EM | Admit: 2017-04-04 | Discharge: 2017-04-04 | Disposition: A | Discharge status: Home / Self Care | Payer: Medicaid Other | Attending: Family Medicine | Admitting: Family Medicine

## 2017-04-04 DIAGNOSIS — S8001XA Contusion of right knee, initial encounter: Secondary | ICD-10-CM

## 2017-04-04 DIAGNOSIS — S50312A Abrasion of left elbow, initial encounter: Secondary | ICD-10-CM

## 2017-04-04 DIAGNOSIS — W19XXXA Unspecified fall, initial encounter: Secondary | ICD-10-CM

## 2017-04-04 MED ORDER — MUPIROCIN 2 % EX OINT: 1.0000 "application " | TOPICAL_OINTMENT | Freq: Three times a day (TID) | CUTANEOUS | 0 refills | Status: AC
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
## 2017-04-04 NOTE — ED Notes (Signed)
Bacitracin and bandaid applied over left elbow. Ice to right knee
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
## 2017-05-10 ENCOUNTER — Ambulatory Visit: Payer: Medicaid Other | Admitting: Podiatry

## 2017-05-14 ENCOUNTER — Ambulatory Visit
Admission: EM | Admit: 2017-05-14 | Discharge: 2017-05-14 | Disposition: A | Discharge status: Home / Self Care | Payer: Medicaid Other | Attending: Family Medicine | Admitting: Family Medicine

## 2017-05-14 ENCOUNTER — Encounter: Payer: Self-pay | Admitting: Emergency Medicine

## 2017-05-14 DIAGNOSIS — R51 Headache: Secondary | ICD-10-CM | POA: Insufficient documentation

## 2017-05-14 DIAGNOSIS — Z961 Presence of intraocular lens: Secondary | ICD-10-CM | POA: Insufficient documentation

## 2017-05-14 DIAGNOSIS — Z791 Long term (current) use of non-steroidal anti-inflammatories (NSAID): Secondary | ICD-10-CM | POA: Insufficient documentation

## 2017-05-14 DIAGNOSIS — G710 Muscular dystrophy: Secondary | ICD-10-CM | POA: Insufficient documentation

## 2017-05-14 DIAGNOSIS — G919 Hydrocephalus, unspecified: Secondary | ICD-10-CM | POA: Insufficient documentation

## 2017-05-14 DIAGNOSIS — G44209 Tension-type headache, unspecified, not intractable: Secondary | ICD-10-CM | POA: Diagnosis not present

## 2017-05-14 DIAGNOSIS — Z888 Allergy status to other drugs, medicaments and biological substances status: Secondary | ICD-10-CM | POA: Insufficient documentation

## 2017-05-14 DIAGNOSIS — F79 Unspecified intellectual disabilities: Secondary | ICD-10-CM | POA: Diagnosis not present

## 2017-05-14 DIAGNOSIS — R21 Rash and other nonspecific skin eruption: Secondary | ICD-10-CM | POA: Diagnosis not present

## 2017-05-14 DIAGNOSIS — G7111 Myotonic muscular dystrophy: Secondary | ICD-10-CM | POA: Diagnosis not present

## 2017-05-14 DIAGNOSIS — K219 Gastro-esophageal reflux disease without esophagitis: Secondary | ICD-10-CM | POA: Diagnosis not present

## 2017-05-14 DIAGNOSIS — F418 Other specified anxiety disorders: Secondary | ICD-10-CM | POA: Insufficient documentation

## 2017-05-14 DIAGNOSIS — D72829 Elevated white blood cell count, unspecified: Secondary | ICD-10-CM | POA: Insufficient documentation

## 2017-05-14 DIAGNOSIS — Z793 Long term (current) use of hormonal contraceptives: Secondary | ICD-10-CM | POA: Diagnosis not present

## 2017-05-14 DIAGNOSIS — Z79899 Other long term (current) drug therapy: Secondary | ICD-10-CM | POA: Insufficient documentation

## 2017-05-14 LAB — RAPID STREP SCREEN (MED CTR MEBANE ONLY): Streptococcus, Group A Screen (Direct): NEGATIVE

## 2017-05-14 LAB — CBC WITH DIFFERENTIAL/PLATELET
Basophils Absolute: 0.1 10*3/uL (ref 0–0.1)
Basophils Relative: 1 %
Eosinophils Absolute: 0.2 10*3/uL (ref 0–0.7)
Eosinophils Relative: 1 %
HCT: 40.2 % (ref 35.0–47.0)
Hemoglobin: 13.3 g/dL (ref 12.0–16.0)
Lymphocytes Relative: 7 %
Lymphs Abs: 1 10*3/uL (ref 1.0–3.6)
MCH: 29.4 pg (ref 26.0–34.0)
MCHC: 33.1 g/dL (ref 32.0–36.0)
MCV: 88.7 fL (ref 80.0–100.0)
Monocytes Absolute: 0.7 10*3/uL (ref 0.2–0.9)
Monocytes Relative: 5 %
Neutro Abs: 12 10*3/uL — ABNORMAL HIGH (ref 1.4–6.5)
Neutrophils Relative %: 86 %
Platelets: 121 10*3/uL — ABNORMAL LOW (ref 150–440)
RBC: 4.53 MIL/uL (ref 3.80–5.20)
RDW: 14.9 % — ABNORMAL HIGH (ref 11.5–14.5)
WBC: 14 10*3/uL — ABNORMAL HIGH (ref 3.6–11.0)

## 2017-05-14 MED ORDER — ACETAMINOPHEN 500 MG PO TABS
500.0000 mg | ORAL_TABLET | Freq: Once | ORAL | Status: DC
Start: 2017-05-14 — End: 2017-05-14

## 2017-05-14 MED ORDER — AZITHROMYCIN 250 MG PO TABS: ORAL_TABLET | ORAL | 0 refills | Status: AC

## 2017-05-14 NOTE — ED Triage Notes (Signed)
Patient states she was trying to get into the [place] and she fell back and landed on her buttock on Wed..  Patient c/o HAs.  Patient c/o rash on her head since yesterday.
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
## 2017-05-14 NOTE — Discharge Instructions (Signed)
Use Tylenol 650 mg every 6 hours for headache or fever. If she worsens tonight or tomorrow go to the emergency room. Otherwise follow-up with primary care physician next week
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
## 2017-05-16 ENCOUNTER — Inpatient Hospital Stay
Admission: EM | Admit: 2017-05-16 | Discharge: 2017-06-24 | DRG: 870 | Disposition: E | Payer: Medicaid Other | Attending: Internal Medicine | Admitting: Internal Medicine

## 2017-05-16 ENCOUNTER — Emergency Department: Payer: Medicaid Other

## 2017-05-16 DIAGNOSIS — J80 Acute respiratory distress syndrome: Secondary | ICD-10-CM | POA: Diagnosis present

## 2017-05-16 DIAGNOSIS — Z452 Encounter for adjustment and management of vascular access device: Secondary | ICD-10-CM

## 2017-05-16 DIAGNOSIS — F419 Anxiety disorder, unspecified: Secondary | ICD-10-CM | POA: Diagnosis present

## 2017-05-16 DIAGNOSIS — R34 Anuria and oliguria: Secondary | ICD-10-CM | POA: Diagnosis not present

## 2017-05-16 DIAGNOSIS — Z9049 Acquired absence of other specified parts of digestive tract: Secondary | ICD-10-CM

## 2017-05-16 DIAGNOSIS — Z0189 Encounter for other specified special examinations: Secondary | ICD-10-CM

## 2017-05-16 DIAGNOSIS — Z79899 Other long term (current) drug therapy: Secondary | ICD-10-CM

## 2017-05-16 DIAGNOSIS — E872 Acidosis: Secondary | ICD-10-CM | POA: Diagnosis present

## 2017-05-16 DIAGNOSIS — N179 Acute kidney failure, unspecified: Secondary | ICD-10-CM

## 2017-05-16 DIAGNOSIS — E860 Dehydration: Secondary | ICD-10-CM | POA: Diagnosis present

## 2017-05-16 DIAGNOSIS — F70 Mild intellectual disabilities: Secondary | ICD-10-CM | POA: Diagnosis present

## 2017-05-16 DIAGNOSIS — K219 Gastro-esophageal reflux disease without esophagitis: Secondary | ICD-10-CM | POA: Diagnosis present

## 2017-05-16 DIAGNOSIS — Z515 Encounter for palliative care: Secondary | ICD-10-CM | POA: Diagnosis not present

## 2017-05-16 DIAGNOSIS — R05 Cough: Secondary | ICD-10-CM

## 2017-05-16 DIAGNOSIS — G919 Hydrocephalus, unspecified: Secondary | ICD-10-CM | POA: Diagnosis present

## 2017-05-16 DIAGNOSIS — F418 Other specified anxiety disorders: Secondary | ICD-10-CM | POA: Diagnosis present

## 2017-05-16 DIAGNOSIS — Z982 Presence of cerebrospinal fluid drainage device: Secondary | ICD-10-CM

## 2017-05-16 DIAGNOSIS — I480 Paroxysmal atrial fibrillation: Secondary | ICD-10-CM | POA: Diagnosis not present

## 2017-05-16 DIAGNOSIS — J181 Lobar pneumonia, unspecified organism: Secondary | ICD-10-CM | POA: Diagnosis not present

## 2017-05-16 DIAGNOSIS — Z23 Encounter for immunization: Secondary | ICD-10-CM | POA: Diagnosis not present

## 2017-05-16 DIAGNOSIS — J029 Acute pharyngitis, unspecified: Secondary | ICD-10-CM | POA: Diagnosis present

## 2017-05-16 DIAGNOSIS — A419 Sepsis, unspecified organism: Secondary | ICD-10-CM | POA: Diagnosis present

## 2017-05-16 DIAGNOSIS — R059 Cough, unspecified: Secondary | ICD-10-CM

## 2017-05-16 DIAGNOSIS — J9600 Acute respiratory failure, unspecified whether with hypoxia or hypercapnia: Secondary | ICD-10-CM

## 2017-05-16 DIAGNOSIS — Z4659 Encounter for fitting and adjustment of other gastrointestinal appliance and device: Secondary | ICD-10-CM

## 2017-05-16 DIAGNOSIS — J690 Pneumonitis due to inhalation of food and vomit: Secondary | ICD-10-CM | POA: Diagnosis present

## 2017-05-16 DIAGNOSIS — E870 Hyperosmolality and hypernatremia: Secondary | ICD-10-CM | POA: Diagnosis present

## 2017-05-16 DIAGNOSIS — E876 Hypokalemia: Secondary | ICD-10-CM | POA: Diagnosis present

## 2017-05-16 DIAGNOSIS — L539 Erythematous condition, unspecified: Secondary | ICD-10-CM | POA: Diagnosis present

## 2017-05-16 DIAGNOSIS — J189 Pneumonia, unspecified organism: Secondary | ICD-10-CM | POA: Diagnosis not present

## 2017-05-16 DIAGNOSIS — R739 Hyperglycemia, unspecified: Secondary | ICD-10-CM | POA: Diagnosis present

## 2017-05-16 DIAGNOSIS — Z66 Do not resuscitate: Secondary | ICD-10-CM | POA: Diagnosis not present

## 2017-05-16 DIAGNOSIS — R21 Rash and other nonspecific skin eruption: Secondary | ICD-10-CM | POA: Diagnosis present

## 2017-05-16 DIAGNOSIS — G7111 Myotonic muscular dystrophy: Secondary | ICD-10-CM | POA: Diagnosis present

## 2017-05-16 DIAGNOSIS — R197 Diarrhea, unspecified: Secondary | ICD-10-CM | POA: Diagnosis not present

## 2017-05-16 DIAGNOSIS — Z01818 Encounter for other preprocedural examination: Secondary | ICD-10-CM

## 2017-05-16 DIAGNOSIS — B309 Viral conjunctivitis, unspecified: Secondary | ICD-10-CM | POA: Diagnosis present

## 2017-05-16 DIAGNOSIS — R000 Tachycardia, unspecified: Secondary | ICD-10-CM | POA: Diagnosis not present

## 2017-05-16 DIAGNOSIS — J9690 Respiratory failure, unspecified, unspecified whether with hypoxia or hypercapnia: Secondary | ICD-10-CM

## 2017-05-16 DIAGNOSIS — R6521 Severe sepsis with septic shock: Secondary | ICD-10-CM | POA: Diagnosis present

## 2017-05-16 DIAGNOSIS — B09 Unspecified viral infection characterized by skin and mucous membrane lesions: Secondary | ICD-10-CM

## 2017-05-16 DIAGNOSIS — R6520 Severe sepsis without septic shock: Secondary | ICD-10-CM | POA: Diagnosis not present

## 2017-05-16 DIAGNOSIS — R140 Abdominal distension (gaseous): Secondary | ICD-10-CM

## 2017-05-16 DIAGNOSIS — E8770 Fluid overload, unspecified: Secondary | ICD-10-CM | POA: Diagnosis not present

## 2017-05-16 DIAGNOSIS — D696 Thrombocytopenia, unspecified: Secondary | ICD-10-CM | POA: Diagnosis present

## 2017-05-16 DIAGNOSIS — T360X5A Adverse effect of penicillins, initial encounter: Secondary | ICD-10-CM | POA: Diagnosis present

## 2017-05-16 DIAGNOSIS — T380X5A Adverse effect of glucocorticoids and synthetic analogues, initial encounter: Secondary | ICD-10-CM | POA: Diagnosis not present

## 2017-05-16 LAB — CBC WITH DIFFERENTIAL/PLATELET
BASOS ABS: 0 10*3/uL (ref 0–0.1)
BASOS PCT: 0 %
EOS ABS: 0.1 10*3/uL (ref 0–0.7)
Eosinophils Relative: 1 %
HEMATOCRIT: 41.2 % (ref 35.0–47.0)
Hemoglobin: 13.5 g/dL (ref 12.0–16.0)
Lymphocytes Relative: 7 %
Lymphs Abs: 1.1 10*3/uL (ref 1.0–3.6)
MCH: 28.8 pg (ref 26.0–34.0)
MCHC: 32.7 g/dL (ref 32.0–36.0)
MCV: 88.1 fL (ref 80.0–100.0)
Monocytes Absolute: 0.4 10*3/uL (ref 0.2–0.9)
Monocytes Relative: 3 %
NEUTROS ABS: 12.7 10*3/uL — AB (ref 1.4–6.5)
NEUTROS PCT: 89 %
Platelets: 98 10*3/uL — ABNORMAL LOW (ref 150–440)
RBC: 4.68 MIL/uL (ref 3.80–5.20)
RDW: 15.3 % — ABNORMAL HIGH (ref 11.5–14.5)
WBC: 14.4 10*3/uL — ABNORMAL HIGH (ref 3.6–11.0)

## 2017-05-16 LAB — URINALYSIS, COMPLETE (UACMP) WITH MICROSCOPIC
Bacteria, UA: NONE SEEN
Bilirubin Urine: NEGATIVE
Glucose, UA: NEGATIVE mg/dL
Hgb urine dipstick: NEGATIVE
KETONES UR: NEGATIVE mg/dL
Nitrite: NEGATIVE
PH: 5 (ref 5.0–8.0)
Protein, ur: 30 mg/dL — AB
SPECIFIC GRAVITY, URINE: 1.029 (ref 1.005–1.030)

## 2017-05-16 LAB — COMPREHENSIVE METABOLIC PANEL
ALK PHOS: 124 U/L (ref 38–126)
ALT: 61 U/L — AB (ref 14–54)
ANION GAP: 11 (ref 5–15)
AST: 58 U/L — ABNORMAL HIGH (ref 15–41)
Albumin: 3.1 g/dL — ABNORMAL LOW (ref 3.5–5.0)
BILIRUBIN TOTAL: 2 mg/dL — AB (ref 0.3–1.2)
BUN: 16 mg/dL (ref 6–20)
CALCIUM: 8.6 mg/dL — AB (ref 8.9–10.3)
CO2: 25 mmol/L (ref 22–32)
CREATININE: 0.72 mg/dL (ref 0.44–1.00)
Chloride: 102 mmol/L (ref 101–111)
GFR calc non Af Amer: >60 mL/min (ref >60)
Glucose, Bld: 97 mg/dL (ref 65–99)
Potassium: 4 mmol/L (ref 3.5–5.1)
Sodium: 138 mmol/L (ref 135–145)
TOTAL PROTEIN: 6.2 g/dL — AB (ref 6.5–8.1)

## 2017-05-16 LAB — BLOOD GAS, VENOUS
Acid-base deficit: 3.5 mmol/L — ABNORMAL HIGH (ref 0.0–2.0)
BICARBONATE: 22.7 mmol/L (ref 20.0–28.0)
FIO2: 0.21
O2 Saturation: 57.5 %
PH VEN: 7.32 (ref 7.250–7.430)
Patient temperature: 37
pCO2, Ven: 44 mmHg (ref 44.0–60.0)
pO2, Ven: 33 mmHg (ref 32.0–45.0)

## 2017-05-16 LAB — TROPONIN I
TROPONIN I: 0.18 ng/mL — AB (ref <0.03)
Troponin I: 0.03 ng/mL (ref <0.03)

## 2017-05-16 LAB — LACTIC ACID, PLASMA
LACTIC ACID, VENOUS: 2 mmol/L — AB (ref 0.5–1.9)
Lactic Acid, Venous: 4.1 mmol/L (ref 0.5–1.9)

## 2017-05-16 LAB — MRSA PCR SCREENING: MRSA BY PCR: NEGATIVE

## 2017-05-16 MED ORDER — LORAZEPAM 1 MG PO TABS
1.0000 mg | ORAL_TABLET | Freq: Three times a day (TID) | ORAL | Status: DC | PRN
  Administered 2017-05-17: 1 mg via ORAL
  Filled 2017-05-16: qty 1

## 2017-05-16 MED ORDER — CEFEPIME HCL 2 G IJ SOLR
2.0000 g | Freq: Three times a day (TID) | INTRAMUSCULAR | Status: DC
  Administered 2017-05-16 – 2017-05-18 (×5): 2 g via INTRAVENOUS
  Filled 2017-05-16 (×7): qty 2

## 2017-05-16 MED ORDER — SODIUM CHLORIDE 0.9 % IV BOLUS (SEPSIS)
1000.0000 mL | Freq: Once | INTRAVENOUS | Status: AC
  Administered 2017-05-16: 1000 mL via INTRAVENOUS

## 2017-05-16 MED ORDER — GUAIFENESIN-DM 100-10 MG/5ML PO SYRP
10.0000 mL | ORAL_SOLUTION | Freq: Four times a day (QID) | ORAL | Status: DC | PRN
  Administered 2017-05-17: 23:00:00 10 mL via ORAL
  Filled 2017-05-16 (×2): qty 10

## 2017-05-16 MED ORDER — SODIUM CHLORIDE 0.9 % IV SOLN
INTRAVENOUS | Status: AC
  Administered 2017-05-16 – 2017-05-17 (×3): via INTRAVENOUS

## 2017-05-16 MED ORDER — ERYTHROMYCIN 5 MG/GM OP OINT: 1.0000 "application " | TOPICAL_OINTMENT | Freq: Four times a day (QID) | OPHTHALMIC | 0 refills | Status: AC

## 2017-05-16 MED ORDER — PANTOPRAZOLE SODIUM 40 MG PO TBEC
40.0000 mg | DELAYED_RELEASE_TABLET | Freq: Every day | ORAL | Status: DC
  Administered 2017-05-17 – 2017-05-18 (×2): 40 mg via ORAL
  Filled 2017-05-16 (×2): qty 1

## 2017-05-16 MED ORDER — PNEUMOCOCCAL VAC POLYVALENT 25 MCG/0.5ML IJ INJ: 0.5000 mL | INJECTION | INTRAMUSCULAR | Status: DC

## 2017-05-16 MED ORDER — ACETAMINOPHEN 325 MG PO TABS
650.0000 mg | ORAL_TABLET | ORAL | Status: DC | PRN
  Administered 2017-05-17 – 2017-05-18 (×3): 650 mg via ORAL
  Filled 2017-05-16 (×3): qty 2

## 2017-05-16 MED ORDER — VANCOMYCIN HCL IN DEXTROSE 750-5 MG/150ML-% IV SOLN
750.0000 mg | Freq: Once | INTRAVENOUS | Status: DC
  Filled 2017-05-16: qty 150

## 2017-05-16 MED ORDER — ERYTHROMYCIN 5 MG/GM OP OINT
TOPICAL_OINTMENT | Freq: Four times a day (QID) | OPHTHALMIC | Status: DC
  Administered 2017-05-16 – 2017-05-17 (×5): 1 via OPHTHALMIC
  Administered 2017-05-17: 2 via OPHTHALMIC
  Administered 2017-05-18 – 2017-05-22 (×17): 1 via OPHTHALMIC
  Filled 2017-05-16: qty 3.5
  Filled 2017-05-16: qty 1

## 2017-05-16 MED ORDER — LIDOCAINE VISCOUS 2 % MT SOLN
15.0000 mL | Freq: Three times a day (TID) | OROMUCOSAL | Status: DC | PRN
  Filled 2017-05-16: qty 15

## 2017-05-16 MED ORDER — VANCOMYCIN HCL IN DEXTROSE 750-5 MG/150ML-% IV SOLN
750.0000 mg | Freq: Three times a day (TID) | INTRAVENOUS | Status: DC
Start: 2017-05-17 — End: 2017-05-16
  Filled 2017-05-16 (×2): qty 150

## 2017-05-16 MED ORDER — PIPERACILLIN-TAZOBACTAM 3.375 G IVPB 30 MIN
3.3750 g | Freq: Once | INTRAVENOUS | Status: AC
  Administered 2017-05-16: 3.375 g via INTRAVENOUS

## 2017-05-16 MED ORDER — CHLORHEXIDINE GLUCONATE 0.12 % MT SOLN
15.0000 mL | Freq: Two times a day (BID) | OROMUCOSAL | Status: DC
  Administered 2017-05-16 – 2017-05-20 (×8): 15 mL via OROMUCOSAL
  Filled 2017-05-16 (×7): qty 15

## 2017-05-16 MED ORDER — ERYTHROMYCIN 5 MG/GM OP OINT
TOPICAL_OINTMENT | Freq: Once | OPHTHALMIC | Status: AC
  Administered 2017-05-16: 1 via OPHTHALMIC
  Filled 2017-05-16: qty 1

## 2017-05-16 MED ORDER — IBUPROFEN 400 MG PO TABS
ORAL_TABLET | ORAL | Status: AC
  Filled 2017-05-16: qty 1

## 2017-05-16 MED ORDER — VANCOMYCIN HCL 10 G IV SOLR
1.0000 g | Freq: Once | INTRAVENOUS | Status: DC
  Filled 2017-05-16: qty 1000

## 2017-05-16 MED ORDER — VANCOMYCIN HCL IN DEXTROSE 750-5 MG/150ML-% IV SOLN
750.0000 mg | Freq: Three times a day (TID) | INTRAVENOUS | Status: DC
  Administered 2017-05-16 – 2017-05-18 (×5): 750 mg via INTRAVENOUS
  Filled 2017-05-16 (×7): qty 150

## 2017-05-16 MED ORDER — IBUPROFEN 400 MG PO TABS
400.0000 mg | ORAL_TABLET | Freq: Three times a day (TID) | ORAL | Status: DC | PRN
  Administered 2017-05-16: 400 mg via ORAL

## 2017-05-16 MED ORDER — ENOXAPARIN SODIUM 40 MG/0.4ML ~~LOC~~ SOLN
40.0000 mg | SUBCUTANEOUS | Status: DC
  Administered 2017-05-16 – 2017-05-17 (×2): 40 mg via SUBCUTANEOUS
  Filled 2017-05-16 (×2): qty 0.4

## 2017-05-16 MED ORDER — FLUOXETINE HCL 20 MG PO CAPS
20.0000 mg | ORAL_CAPSULE | Freq: Every day | ORAL | Status: DC
  Administered 2017-05-17 – 2017-05-18 (×2): 20 mg via ORAL
  Filled 2017-05-16 (×2): qty 1

## 2017-05-16 MED ORDER — VANCOMYCIN HCL IN DEXTROSE 1-5 GM/200ML-% IV SOLN
1000.0000 mg | Freq: Once | INTRAVENOUS | Status: AC
  Administered 2017-05-16: 1000 mg via INTRAVENOUS
  Filled 2017-05-16: qty 200

## 2017-05-16 MED ORDER — LORATADINE 10 MG PO TABS
10.0000 mg | ORAL_TABLET | Freq: Every day | ORAL | Status: DC
  Administered 2017-05-16 – 2017-05-21 (×6): 10 mg via ORAL
  Filled 2017-05-16 (×6): qty 1

## 2017-05-16 MED ORDER — PIPERACILLIN-TAZOBACTAM 3.375 G IVPB 30 MIN
INTRAVENOUS | Status: AC
  Filled 2017-05-16: qty 50

## 2017-05-16 NOTE — Progress Notes (Signed)
ANTIBIOTIC CONSULT NOTE - INITIAL  Pharmacy Consult for Cefepime, Vancomycin  Indication: sepsis  Allergies  Allergen Reactions  . Belladonna Alkaloids Other (See Comments)    Unsure of reaction Unsure of reaction    Patient Measurements: Height:  (167.6 cm) Weight: 161 lb 3.2 oz (73.1 kg) IBW/kg (Calculated) : 59.3 Adjusted Body Weight: 64.8 kg   Vital Signs: Temp: 101.4 F (38.6 C) (09/23 1647) Temp Source: Oral (09/23 1647) BP: 127/70 (09/23 1700) Pulse Rate: 115 (09/23 1715) Intake/Output from previous day: No intake/output data recorded. Intake/Output from this shift: No intake/output data recorded.  Labs:  Recent Labs  05/14/17 1745 2017/06/04 1311  WBC 14.0* 14.4*  HGB 13.3 13.5  PLT 121* 98*  CREATININE  --  0.72   Estimated Creatinine Clearance: 93.7 mL/min (by C-G formula based on SCr of 0.72 mg/dL). No results for input(s): VANCOTROUGH, VANCOPEAK, VANCORANDOM, GENTTROUGH, GENTPEAK, GENTRANDOM, TOBRATROUGH, TOBRAPEAK, TOBRARND, AMIKACINPEAK, AMIKACINTROU, AMIKACIN in the last 72 hours.   Microbiology: Recent Results (from the past 720 hour(s))  Rapid strep screen     Status: None   Collection Time: 05/14/17  5:40 PM  Result Value Ref Range Status   Streptococcus, Group A Screen (Direct) NEGATIVE NEGATIVE Final    Comment: (NOTE) A Rapid Antigen test may result negative if the antigen level in the sample is below the detection level of this test. The FDA has not cleared this test as a stand-alone test therefore the rapid antigen negative result has reflexed to a Group A Strep culture.   Culture, group A strep     Status: None (Preliminary result)   Collection Time: 05/14/17  5:40 PM  Result Value Ref Range Status   Specimen Description THROAT  Final   Special Requests NONE Reflexed from [id number]  Final   Culture   Final    CULTURE REINCUBATED FOR BETTER GROWTH Performed at [facility], [address] [place], [place] [address]    Report  Status PENDING  Incomplete    Medical History: Past Medical History:  Diagnosis Date  . Acid reflux   . Anxiety   . Constipation   . Hydrocephalus   . Muscular dystrophy (HCC)     Medications:  Prescriptions Prior to Admission  Medication Sig Dispense Refill Last Dose  . acetaminophen (TYLENOL) 325 MG tablet Take 650 mg by mouth every 4 (four) hours as needed.   PRN at PRN  . azithromycin (ZITHROMAX Z-PAK) 250 MG tablet Use as per package instructions 1 each 0 05/14/2017 at 0800  . chlorhexidine (PERIOGARD) 0.12 % solution Use as directed 15 mLs in the mouth or throat 2 (two) times daily.   05/14/2017 at 2000  . FLUoxetine (PROZAC) 20 MG capsule Take 20 mg by mouth daily.   2017/06/04 at 0800  . ibuprofen (MOTRIN IB) 200 MG tablet Take 2 tablets (400 mg total) by mouth every 8 (eight) hours as needed. 30 tablet 1 PRN at PRN  . lidocaine (XYLOCAINE) 2 % solution Use as directed 15 mLs in the mouth or throat every 8 (eight) hours as needed for mouth pain.   PRN at PRN  . LORazepam (ATIVAN) 1 MG tablet Take 1 mg by mouth as needed for anxiety.   prn at prn  . medroxyPROGESTERone (DEPO-PROVERA) 150 MG/ML injection Inject 150 mg into the muscle every 3 (three) months.   unknown at unknown  . Multiple Vitamins-Minerals (MULTIVITAMIN PO) Take 1 tablet by mouth daily.    06/04/17 at 0800  .  omeprazole (PRILOSEC) 20 MG capsule Take 20 mg by mouth daily.   2017-05-29 at 0800  . Pseudoeph-Bromphen-DM (ROBITUSSIN ALLERGY/COUGH PO) Take 10 mLs by mouth every 4 (four) hours as needed (cough).    PRN at PRN  . Senna (SENNTAB PO) Take 8.6 mg by mouth daily.    05/14/2017 at 0700  . loratadine (CLARITIN) 10 MG tablet Take 1 tablet (10 mg total) by mouth daily. (Patient not taking: Reported on 05/29/2017) 14 tablet 0 Not Taking at Unknown time  . meloxicam (MOBIC) 15 MG tablet Take 1 tablet (15 mg total) by mouth daily. (Patient not taking: Reported on 05-29-2017) 30 tablet 0 Not Taking at Unknown time  .  mupirocin ointment (BACTROBAN) 2 % Apply 1 application topically 3 (three) times daily. (Patient not taking: Reported on 29-May-2017) 22 g 0 Completed Course at Unknown time   Assessment: CrCl = 93.7 ml/min Ke = 0.08 hr-1 T1/2 = 8.45 hrs Vd = 45.4 L   Goal of Therapy:  Vancomycin trough level 15-20 mcg/ml  Plan:  Expected duration 7 days with resolution of temperature and/or normalization of WBC   Cefepime 2 gm IV Q8H ordered to start on 9/23 @ 22:00  Vancomycin 1 gm IV X 1 given on 9/23 @ 15:24. Vancomycin 750 mg IV Q8H ordered to start on 9/23 @ 21:00 ,  ~ 6 hrs after 1st dose (stacked dosing). This pt will reach Css by 9/25 @ 15:00. Will draw 1st trough on 9/25 @ 12:30, which will be close to Css.   Makaylin Carlo D 05-29-2017,7:15 PM

## 2017-05-16 NOTE — ED Triage Notes (Signed)
Pt seen 2 days ago for rash - blood work and strep all negative. Sent in today for same. Nonfebrile. On antibiotic for the rash for possible strep. Vss. From group home.

## 2017-05-16 NOTE — H&P (Signed)
[facility] [place] [facility] [place]   PATIENT NAME: Lindsey Allison    MR#:  [id number]  DATE OF BIRTH:  05-21-1976  DATE OF ADMISSION:  05/05/2017  PRIMARY CARE PHYSICIAN: Hyman Hopes, MD   REQUESTING/REFERRING PHYSICIAN: Dr. Shaune Pollack  CHIEF COMPLAINT:   Rash on body HISTORY OF PRESENT ILLNESS:  Lindsey Allison  is a 41 y.o. female with a known history of myoclonic dystrophy, anxiety, GERD,hydrocephalus,poor historian, is brought into the ED by her care giver as patient is not feeling well Patient was seen at urgent care on Friday, throat cultures were obtained and patient was given a prescription for azithromycin for possible strep throat,. Both conjunctiva are red and patient is brought into the  Emergency department.Chest x-ray has revealed bilateral infiltrates Patient is started on antibiotics and hospitalist team is called to admit the patient  PAST MEDICAL HISTORY:   Past Medical History:  Diagnosis Date  . Acid reflux   . Anxiety   . Constipation   . Hydrocephalus   . Muscular dystrophy (HCC)     PAST SURGICAL HISTOIRY:   Past Surgical History:  Procedure Laterality Date  . shunt in head      SOCIAL HISTORY:   Social History  Substance Use Topics  . Smoking status: Never Smoker  . Smokeless tobacco: Never Used  . Alcohol use No    FAMILY HISTORY:  No family history on file.  DRUG ALLERGIES:   Allergies  Allergen Reactions  . Belladonna Alkaloids Other (See Comments)    Unsure of reaction Unsure of reaction    REVIEW OF SYSTEMS:  Patient is mentally challenged, poor historian  CONSTITUTIONAL: No fever, fatigue or weakness.  EYES: both eyes are red EARS, NOSE, AND THROAT: No tinnitus or ear pain. Throat is painful RESPIRATORY: somecough, shortness of breath,no wheezing or hemoptysis.   SKIN: rash on her body is better  MEDICATIONS AT HOME:   Prior to Admission medications   Medication Sig Start Date End  Date Taking? Authorizing Provider  acetaminophen (TYLENOL) 325 MG tablet Take 650 mg by mouth every 4 (four) hours as needed.   Yes [provider]  azithromycin (ZITHROMAX Z-PAK) 250 MG tablet Use as per package instructions 05/14/17  Yes Lutricia Feil, PA-C  chlorhexidine (PERIOGARD) 0.12 % solution Use as directed 15 mLs in the mouth or throat 2 (two) times daily.   Yes [provider]  FLUoxetine (PROZAC) 20 MG capsule Take 20 mg by mouth daily.   Yes [provider]  ibuprofen (MOTRIN IB) 200 MG tablet Take 2 tablets (400 mg total) by mouth every 8 (eight) hours as needed. 12/24/16  Yes Enid Derry, PA-C  lidocaine (XYLOCAINE) 2 % solution Use as directed 15 mLs in the mouth or throat every 8 (eight) hours as needed for mouth pain.   Yes [provider]  LORazepam (ATIVAN) 1 MG tablet Take 1 mg by mouth as needed for anxiety.   Yes [provider]  medroxyPROGESTERone (DEPO-PROVERA) 150 MG/ML injection Inject 150 mg into the muscle every 3 (three) months.   Yes [provider]  Multiple Vitamins-Minerals (MULTIVITAMIN PO) Take 1 tablet by mouth daily.    Yes [provider]  omeprazole (PRILOSEC) 20 MG capsule Take 20 mg by mouth daily.   Yes [provider]  Pseudoeph-Bromphen-DM (ROBITUSSIN ALLERGY/COUGH PO) Take 10 mLs by mouth every 4 (four) hours as needed (cough).    Yes [provider]  Senna (SENNTAB PO)  Take 8.6 mg by mouth daily.    Yes [provider]  erythromycin ophthalmic ointment Place 1 application into both eyes 4 (four) times daily. 06/06/2017 05/23/17  Governor Rooks, MD  loratadine (CLARITIN) 10 MG tablet Take 1 tablet (10 mg total) by mouth daily. Patient not taking: Reported on 06-06-2017 10/22/15 11/05/15  Renford Dills, NP  meloxicam (MOBIC) 15 MG tablet Take 1 tablet (15 mg total) by mouth daily. Patient not taking: Reported on 2017/06/06 01/24/16   Hassan Rowan, MD  mupirocin  ointment (BACTROBAN) 2 % Apply 1 application topically 3 (three) times daily. Patient not taking: Reported on 06/06/2017 04/04/17   Lutricia Feil, PA-C      VITAL SIGNS:  Blood pressure 103/69, pulse (!) 116, temperature 98.9 F (37.2 C), temperature source Oral, resp. rate 19, SpO2 95 %.  PHYSICAL EXAMINATION:  GENERAL:  41 y.o.-year-old patient lying in the bed with no acute distress.  EYES: Pupils equal, round, reactive to light and accommodation. No scleral icterus.bilateral conjunctival injection HEENT: Head atraumatic, normocephalic. Oropharynx and nasopharynx clear. No erythema noted on uvula ,upper back of the throat -Limited exam as patient was not quite cooperative NECK:  Supple, no jugular venous distention. No thyroid enlargement, no tenderness.  LUNGS: moderate breath sounds bilaterally, no wheezing, rales,rhonchi .bilateralcrackles, crepitation. No use of accessory muscles of respiration.  CARDIOVASCULAR: S1, S2 normal. No murmurs, rubs, or gallops.  ABDOMEN: Soft, nontender, nondistended. Bowel sounds present. No organomegaly or mass.  EXTREMITIES: No pedal edema, cyanosis, or clubbing.  NEUROLOGIC:patient is mentally challenged, awake and alert o1-2 PSYCHIATRIC: The patient is alert and oriented x to person.  SKIN: No obvious rash, lesion, or ulcer.   LABORATORY PANEL:   CBC  Recent Labs Lab Jun 06, 2017 1311  WBC 14.4*  HGB 13.5  HCT 41.2  PLT 98*   ------------------------------------------------------------------------------------------------------------------  Chemistries   Recent Labs Lab 06/06/2017 1311  NA 138  K 4.0  CL 102  CO2 25  GLUCOSE 97  BUN 16  CREATININE 0.72  CALCIUM 8.6*  AST 58*  ALT 61*  ALKPHOS 124  BILITOT 2.0*   ------------------------------------------------------------------------------------------------------------------  Cardiac Enzymes No results for input(s): TROPONINI in the last 168  hours. ------------------------------------------------------------------------------------------------------------------  RADIOLOGY:  Dg Chest 2 View  Result Date: 06-Jun-2017 CLINICAL DATA:  Cough and lethargy EXAM: CHEST  2 VIEW COMPARISON:  01/12/2017 FINDINGS: Cardiac shadow is within normal limits. The lungs are well aerated bilaterally. Patchy infiltrative changes are seen in the left mid and lower lung as well as the right lung base. No sizable effusion is seen. No bony abnormality is noted. Ventricular shunt is noted on the right. IMPRESSION: Bilateral infiltrates as described. Electronically Signed   By: Alcide Clever M.D.   On: 06-06-17 12:37    EKG:   Orders placed or performed during the hospital encounter of 01/12/17  . ED EKG within 10 minutes  . ED EKG within 10 minutes  . EKG 12-Lead  . EKG 12-Lead  . EKG    IMPRESSION AND PLAN:  Lindsey Allison  is a 41 y.o. female with a known history of myoclonic dystrophy, anxiety, GERD,hydrocephalus,poor historian, is brought into the ED by her care giver as patient is not feeling well Patient was seen at urgent care on Friday, throat cultures were obtained and patient was given a prescription for azithromycin for possible strep throat,. 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 Management plans discussed with the patient, patient's caregiver Deloice Vincent at bedside and they are in agreement.  CODE STATUS: FC,PATIENT IS WARD OF STATE  TOTAL TIME TAKING CARE OF THIS PATIENT: 43 minutes.   Note: This dictation was prepared with Dragon dictation along with smaller phrase technology. Any transcriptional errors that result from this process are unintentional.  Ramonita Lab M.D on 06-10-17 at 4:53 PM  Between 7am to 6pm - Pager - [phone]  After 6pm go to [email or web address] - password [email or web address] [facility]  [facility] [facility]  Office  [phone]  CC: Primary care physician; Hyman Hopes, MD

## 2017-05-16 NOTE — ED Notes (Signed)
Patient transported to X-ray 

## 2017-05-16 NOTE — ED Notes (Signed)
CODE SEPSIS CALLED TO KIM AT [facility] 

## 2017-05-16 NOTE — ED Provider Notes (Signed)
[facility] Emergency Department Provider Note ____________________________________________   I have reviewed the triage vital signs and the triage nursing note.  HISTORY  Chief Complaint Rash   Historian Patient Poor historian, lives at a group home Group home care provider provides the history  HPI Lindsey Allison is a 41 y.o. female who stays at a group home, has a history of myotonic dystrophy and intellectual disability, presents for reevaluation due to congestion and cough and skin rash.  Patient was in her usual state of health until about Friday when she was complaining of a sore throat. She was taken to urgent care and had a negative strep test was told that she was going to be "treated as strep "and was given a prescription for azithromycin. The patient had 2 doses on Friday evening and one dose yesterday. Yesterday on Saturday sounds like she was doing quite well overall.  This caregiver saw her this morning and noted that she had not gotten out of bed yet, seem to be dehydrated with dry mouth, had a erythematous skin rash when she was at urgent care, which now looks somewhat better, but is still present around the temples and on the neck.  Decreased activity level, but no significant altered mental status.  Patient states that she is having a mild cough although she's not coughing here. No reported fevers. The chin had reported she was "hot" to the caregivers to return to the ear off.  No abdominal pain or vomiting. She does have some mild eye crusting/right conjunctiva.  Still stating that she has some mild sore throat which is limiting her eating.    Past Medical History:  Diagnosis Date  . Acid reflux   . Anxiety   . Constipation   . Hydrocephalus   . Muscular dystrophy [facility])     Patient Active Problem List   Diagnosis Date Noted  . Pseudophakia of right eye 11/06/2016  . Depression with anxiety 10/22/2016  . Gastroesophageal  reflux disease 10/22/2016  . Intellectual disability 10/22/2016  . Cataracta 10/15/2016  . 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 shunt in head      Prior to Admission medications   Medication Sig Start Date End Date Taking? Authorizing Provider  acetaminophen (TYLENOL) 325 MG tablet Take 650 mg by mouth every 4 (four) hours as needed.   Yes [provider]  azithromycin (ZITHROMAX Z-PAK) 250 MG tablet Use as per package instructions 05/14/17  Yes Lutricia Feil, PA-C  chlorhexidine (PERIOGARD) 0.12 % solution Use as directed 15 mLs in the mouth or throat 2 (two) times daily.   Yes [provider]  FLUoxetine (PROZAC) 20 MG capsule Take 20 mg by mouth daily.   Yes [provider]  ibuprofen (MOTRIN IB) 200 MG tablet Take 2 tablets (400 mg total) by mouth every 8 (eight) hours as needed. 12/24/16  Yes Enid Derry, PA-C  lidocaine (XYLOCAINE) 2 % solution Use as directed 15 mLs in the mouth or throat every 8 (eight) hours as needed for mouth pain.   Yes [provider]  LORazepam (ATIVAN) 1 MG tablet Take 1 mg by mouth as needed for anxiety.   Yes [provider]  medroxyPROGESTERone (DEPO-PROVERA) 150 MG/ML injection Inject 150 mg into the muscle every 3 (three) months.   Yes [provider]  Multiple Vitamins-Minerals (MULTIVITAMIN PO) Take 1 tablet by mouth daily.    Yes [provider]  omeprazole (PRILOSEC) 20 MG capsule Take 20 mg by mouth daily.   Yes [provider]  Pseudoeph-Bromphen-DM (ROBITUSSIN ALLERGY/COUGH PO) Take 10 mLs by mouth every 4 (four) hours as needed (cough).    Yes [provider]  Senna (SENNTAB PO) Take 8.6 mg by mouth daily.    Yes [provider]  erythromycin ophthalmic ointment Place 1 application into both eyes 4 (four) times daily. 04-Jun-2017 05/23/17  Governor Rooks, MD  loratadine (CLARITIN) 10 MG tablet Take  1 tablet (10 mg total) by mouth daily. Patient not taking: Reported on 06/04/17 10/22/15 11/05/15  Renford Dills, NP  meloxicam (MOBIC) 15 MG tablet Take 1 tablet (15 mg total) by mouth daily. Patient not taking: Reported on 06-04-17 01/24/16   Hassan Rowan, MD  mupirocin ointment (BACTROBAN) 2 % Apply 1 application topically 3 (three) times daily. Patient not taking: Reported on 06-04-17 04/04/17   Lutricia Feil, PA-C    Allergies  Allergen Reactions  . Belladonna Alkaloids Other (See Comments)    Unsure of reaction Unsure of reaction    No family history on file.  Social History Social History  Substance Use Topics  . Smoking status: Never Smoker  . Smokeless tobacco: Never Used  . Alcohol use No    Review of Systems  Constitutional: Negative for fever. Eyes: Negative for visual changes. ENT: positivefor sore throat. Cardiovascular: Negative for chest pain. Respiratory: Negative for shortness of breath.  positive for intermittent cough. Gastrointestinal: Negative for abdominal pain, vomiting and diarrhea. Genitourinary: Negative for dysuria. Musculoskeletal: Negative for back pain. Skin: positivefor rash. Neurological: Negative for headache.  ____________________________________________   PHYSICAL EXAM:  VITAL SIGNS: ED Triage Vitals  Enc Vitals Group     BP 06-04-2017 1101 103/69     Pulse Rate 04-Jun-2017 1101 (!) 116     Resp 06/04/2017 1101 19     Temp 2017/06/04 1101 98.9 F (37.2 C)     Temp Source 2017-06-04 1101 Oral     SpO2 04-Jun-2017 1101 95 %     Weight --      Height --      Head Circumference --      Peak Flow --      Pain Score June 04, 2017 1059 0     Pain Loc --      Pain Edu? --      Excl. in GC? --      Constitutional: Alert and cooperative. No acute distress. HEENT   Head: Normocephalic and atraumatic.      Eyes: conjunctiva somewhat swollen and red bilaterally. Some clear drainage/crusting at the eyes.      Ears:         Nose: mild  crusting at the nares.   Mouth/Throat: Mucous membranes are moderately dry.  mild erythema to the posterior oropharynx without tonsillar exudates or swelling. Uvula midline.   Neck: No stridor. Cardiovascular/Chest: Normal rate, regular rhythm.  No murmurs, rubs, or gallops. Respiratory: Normal respiratory effort without tachypnea nor retractions. Breath sounds are clear and equal bilaterally. No wheezes/rales/rhonchi. Gastrointestinal: Soft. No distention, no guarding, no rebound. Nontender.    Genitourinary/rectal:Deferred Musculoskeletal: Nontender with normal range of motion in all extremities. No joint effusions.  No lower extremity tenderness.  No edema. Neurologic:  no facial droop.  No gross or focal neurologic deficits are appreciated. Skin:  Skin is warm, dry and intact. erythema across the temples, and also along the neck without any petechiae, purpura, blistering. No apparent mucous  membrane involvement. Psychiatric: Mood and affect are normal. Speech and behavior are normal. Patient exhibits appropriate insight and judgment.   ____________________________________________  LABS (pertinent positives/negatives) I, Governor Rooks, MD the attending physician have reviewed the labs noted below.  Labs Reviewed  COMPREHENSIVE METABOLIC PANEL - Abnormal; Notable for the following:       Result Value   Calcium 8.6 (*)    Total Protein 6.2 (*)    Albumin 3.1 (*)    AST 58 (*)    ALT 61 (*)    Total Bilirubin 2.0 (*)    All other components within normal limits  CBC WITH DIFFERENTIAL/PLATELET - Abnormal; Notable for the following:    WBC 14.4 (*)    RDW 15.3 (*)    Platelets 98 (*)    Neutro Abs 12.7 (*)    All other components within normal limits  BLOOD GAS, VENOUS - Abnormal; Notable for the following:    Acid-base deficit 3.5 (*)    All other components within normal limits  LACTIC ACID, PLASMA - Abnormal; Notable for the following:    Lactic Acid, Venous 4.1 (*)     All other components within normal limits  URINE CULTURE  CULTURE, BLOOD (ROUTINE X 2)  CULTURE, BLOOD (ROUTINE X 2)  URINALYSIS, COMPLETE (UACMP) WITH MICROSCOPIC  LACTIC ACID, PLASMA  TROPONIN I    ____________________________________________    EKG I, Governor Rooks, MD, the attending physician have personally viewed and interpreted all ECGs.  none ____________________________________________  RADIOLOGY All Xrays were viewed by me.  Imaging interpreted by Radiologist, and I, Governor Rooks, MD the attending physician have reviewed the radiologist interpretation noted below.  chest x-ray two-view:  IMPRESSION: Bilateral infiltrates as described. __________________________________________  PROCEDURES  Procedure(s) performed: None  Critical Care performed: None  ____________________________________________  No current facility-administered medications on file prior to encounter.    Current Outpatient Prescriptions on File Prior to Encounter  Medication Sig Dispense Refill  . acetaminophen (TYLENOL) 325 MG tablet Take 650 mg by mouth every 4 (four) hours as needed.    Marland Kitchen azithromycin (ZITHROMAX Z-PAK) 250 MG tablet Use as per package instructions 1 each 0  . chlorhexidine (PERIOGARD) 0.12 % solution Use as directed 15 mLs in the mouth or throat 2 (two) times daily.    Marland Kitchen FLUoxetine (PROZAC) 20 MG capsule Take 20 mg by mouth daily.    Marland Kitchen ibuprofen (MOTRIN IB) 200 MG tablet Take 2 tablets (400 mg total) by mouth every 8 (eight) hours as needed. 30 tablet 1  . LORazepam (ATIVAN) 1 MG tablet Take 1 mg by mouth as needed for anxiety.    . medroxyPROGESTERone (DEPO-PROVERA) 150 MG/ML injection Inject 150 mg into the muscle every 3 (three) months.    . Multiple Vitamins-Minerals (MULTIVITAMIN PO) Take 1 tablet by mouth daily.     Marland Kitchen omeprazole (PRILOSEC) 20 MG capsule Take 20 mg by mouth daily.    . Pseudoeph-Bromphen-DM (ROBITUSSIN ALLERGY/COUGH PO) Take 10 mLs by mouth every 4  (four) hours as needed (cough).     . Senna (SENNTAB PO) Take 8.6 mg by mouth daily.     Marland Kitchen loratadine (CLARITIN) 10 MG tablet Take 1 tablet (10 mg total) by mouth daily. (Patient not taking: Reported on June 10, 2017) 14 tablet 0  . meloxicam (MOBIC) 15 MG tablet Take 1 tablet (15 mg total) by mouth daily. (Patient not taking: Reported on 06-10-2017) 30 tablet 0  . mupirocin ointment (BACTROBAN) 2 % Apply 1 application topically 3 (three)  times daily. (Patient not taking: Reported on 06-08-17) 22 g 0    ____________________________________________  ED COURSE / ASSESSMENT AND PLAN  Pertinent labs & imaging results that were available during my care of the patient were reviewed by me and considered in my medical decision making (see chart for details).  caregiver brought patient in for reevaluation as she still seems to be sick since Friday when she was diagnosed with possible strep throat and given azithromycin despite negative strep testing.  Today patient is interactive with me although caregiver states that she has less energy than she typically does. She does have a mild erythematous what I suspect to be viral exanthem over her face and upper neck. No indication for Stevens-Johnson or mucous membrane involvement.  She does have some mild conjunctivitis and I will prescribe erythromycin for. I suspect that her constellation of symptoms is consistent with viral upper respiratory infection with a viral exanthem.  I'll think symptoms are allergy to the azithromycin as they had started before starting the antibiotic.  Patient is a little bit dehydrated and she has not been taking in that much by mouth although she does look overall relatively comfortable.   during patient's stay patient developed tachycardia and hypotension and was found to have a chest x-ray consistent with multifocal pneumonia and elevated white blood cell count elevated lactate. Sepsis protocol was initiated patient was given  antibiotics.  DIFFERENTIAL DIAGNOSIS: differential diagnosis including but not limited to viral illness, pharyngitis, drug reaction, pneumonia, Stevens-Johnson syndrome, etc.  CONSULTATIONS:  Hospital is for admission.  Patient / Family / Caregiver informed of clinical course, medical decision-making process, and agree with plan.   ___________________________________________   FINAL CLINICAL IMPRESSION(S) / ED DIAGNOSES   Final diagnoses:  Viral exanthem  Acute viral conjunctivitis of both eyes  Pneumonia of both lungs due to infectious organism, unspecified part of lung  Sepsis, due to unspecified organism [facility])              Note: This dictation was prepared with Dragon dictation. Any transcriptional errors that result from this process are unintentional    Governor Rooks, MD 2017/06/08 1601

## 2017-05-17 ENCOUNTER — Inpatient Hospital Stay: Payer: Medicaid Other

## 2017-05-17 LAB — BLOOD CULTURE ID PANEL (REFLEXED)
Acinetobacter baumannii: NOT DETECTED
CANDIDA ALBICANS: NOT DETECTED
CANDIDA GLABRATA: NOT DETECTED
CANDIDA TROPICALIS: NOT DETECTED
Candida krusei: NOT DETECTED
Candida parapsilosis: NOT DETECTED
ENTEROBACTER CLOACAE COMPLEX: NOT DETECTED
ENTEROBACTERIACEAE SPECIES: NOT DETECTED
Enterococcus species: NOT DETECTED
Escherichia coli: NOT DETECTED
HAEMOPHILUS INFLUENZAE: NOT DETECTED
Klebsiella oxytoca: NOT DETECTED
Klebsiella pneumoniae: NOT DETECTED
LISTERIA MONOCYTOGENES: NOT DETECTED
METHICILLIN RESISTANCE: DETECTED — AB
NEISSERIA MENINGITIDIS: NOT DETECTED
PROTEUS SPECIES: NOT DETECTED
Pseudomonas aeruginosa: NOT DETECTED
STAPHYLOCOCCUS SPECIES: DETECTED — AB
STREPTOCOCCUS AGALACTIAE: NOT DETECTED
Serratia marcescens: NOT DETECTED
Staphylococcus aureus (BCID): NOT DETECTED
Streptococcus pneumoniae: NOT DETECTED
Streptococcus pyogenes: NOT DETECTED
Streptococcus species: NOT DETECTED

## 2017-05-17 LAB — CULTURE, GROUP A STREP (THRC)

## 2017-05-17 LAB — TROPONIN I: TROPONIN I: 0.03 ng/mL — AB (ref <0.03)

## 2017-05-17 LAB — LACTIC ACID, PLASMA: Lactic Acid, Venous: 2.4 mmol/L (ref 0.5–1.9)

## 2017-05-17 LAB — STREP PNEUMONIAE URINARY ANTIGEN: STREP PNEUMO URINARY ANTIGEN: NEGATIVE

## 2017-05-17 MED ORDER — METHYLPREDNISOLONE SODIUM SUCC 125 MG IJ SOLR
60.0000 mg | Freq: Two times a day (BID) | INTRAMUSCULAR | Status: DC
  Administered 2017-05-17 – 2017-05-21 (×8): 60 mg via INTRAVENOUS
  Filled 2017-05-17 (×8): qty 2

## 2017-05-17 MED ORDER — ONDANSETRON HCL 4 MG/2ML IJ SOLN
INTRAMUSCULAR | Status: AC
  Administered 2017-05-17: 01:00:00 4 mg via INTRAVENOUS
  Filled 2017-05-17: qty 2

## 2017-05-17 MED ORDER — ONDANSETRON HCL 4 MG/2ML IJ SOLN
4.0000 mg | Freq: Four times a day (QID) | INTRAMUSCULAR | Status: DC | PRN
  Administered 2017-05-17: 4 mg via INTRAVENOUS

## 2017-05-17 MED ORDER — PNEUMOCOCCAL VAC POLYVALENT 25 MCG/0.5ML IJ INJ
0.5000 mL | INJECTION | INTRAMUSCULAR | Status: AC
  Administered 2017-05-19: 0.5 mL via INTRAMUSCULAR
  Filled 2017-05-17: qty 0.5

## 2017-05-17 NOTE — Progress Notes (Signed)
PHARMACY - PHYSICIAN COMMUNICATION CRITICAL VALUE ALERT - BLOOD CULTURE IDENTIFICATION (BCID)  Results for orders placed or performed during the hospital encounter of 05/17/2017  Blood Culture ID Panel (Reflexed) (Collected: 05/22/2017  1:58 PM)  Result Value Ref Range   Enterococcus species NOT DETECTED NOT DETECTED   Listeria monocytogenes NOT DETECTED NOT DETECTED   Staphylococcus species DETECTED (A) NOT DETECTED   Staphylococcus aureus NOT DETECTED NOT DETECTED   Methicillin resistance DETECTED (A) NOT DETECTED   Streptococcus species NOT DETECTED NOT DETECTED   Streptococcus agalactiae NOT DETECTED NOT DETECTED   Streptococcus pneumoniae NOT DETECTED NOT DETECTED   Streptococcus pyogenes NOT DETECTED NOT DETECTED   Acinetobacter baumannii NOT DETECTED NOT DETECTED   Enterobacteriaceae species NOT DETECTED NOT DETECTED   Enterobacter cloacae complex NOT DETECTED NOT DETECTED   Escherichia coli NOT DETECTED NOT DETECTED   Klebsiella oxytoca NOT DETECTED NOT DETECTED   Klebsiella pneumoniae NOT DETECTED NOT DETECTED   Proteus species NOT DETECTED NOT DETECTED   Serratia marcescens NOT DETECTED NOT DETECTED   Haemophilus influenzae NOT DETECTED NOT DETECTED   Neisseria meningitidis NOT DETECTED NOT DETECTED   Pseudomonas aeruginosa NOT DETECTED NOT DETECTED   Candida albicans NOT DETECTED NOT DETECTED   Candida glabrata NOT DETECTED NOT DETECTED   Candida krusei NOT DETECTED NOT DETECTED   Candida parapsilosis NOT DETECTED NOT DETECTED   Candida tropicalis NOT DETECTED NOT DETECTED    Name of physician (or Provider) Contacted: Dr. Allena Katz  Changes to prescribed antibiotics required: None, continue cefepime and vancomycin   Gardner Candle, PharmD, BCPS Clinical Pharmacist 05/17/2017 2:14 PM

## 2017-05-17 NOTE — Plan of Care (Signed)
Problem: Education: Goal: Knowledge of [facility] Education information/materials will improve Outcome: Progressing Pt reported pain, received PRN PO Tylenol  + PRN PO Ativan  for anxiety.  Pills whole in applesauce.  Began coughing, vomited, concern for aspiration.  Dr. Anne Hahn paged, STAT CXR showed worse L mid lung consolidation.  AM VS SpO2 79-80% on RA, 2-12LNC placed with some improvement.  Dr. Sheryle Hail paged.  Venti mask placed per Dr. Sheryle Hail, per RT recommendations FiO2 50%.  Pt tolerating Venti mask.  SpO2 93-94%.  VSS otherwise.  During hypoxic episode, pt more confused, removed gown, PIV's.  Pt now calm, cooperative.  WCTM.

## 2017-05-17 NOTE — Progress Notes (Signed)
[employer] - [place] [facility]                                                                                                                                                                                  Patient Demographics   Lindsey Allison, is a 41 y.o. female, DOB - September 21, 1975, HYQ:[id number]  Admit date - 05/19/2017   Admitting Physician Ramonita Lab, MD  Outpatient Primary MD for the patient is Burns, Shellia Cleverly, MD   LOS - 1  Subjective: Patient admitted with acute respiratory failure currently on 6 L of oxygen. It is hard to understand the patient    Review of Systems:   CONSTITUTIONAL:limited  Vitals:   Vitals:   05/17/17 0510 05/17/17 0520 05/17/17 0534 05/17/17 0800  BP:    (!) 104/57  Pulse:    (!) 116  Resp:    (!) 40  Temp:    99.1 F (37.3 C)  TempSrc:    Oral  SpO2: 91% 93% 94% 93%  Weight:    161 lb (73 kg)  Height:     (1.676 m)    Wt Readings from Last 3 Encounters:  05/17/17 161 lb (73 kg)  04/04/17 142 lb (64.4 kg)  01/12/17 145 lb (65.8 kg)     Intake/Output Summary (Last 24 hours) at 05/17/17 1341 Last data filed at 05/17/17 1340  Gross per 24 hour  Intake             3729 ml  Output                0 ml  Net             3729 ml    Physical Exam:   GENERAL:Mild respiratory distress HEAD, EYES, EARS, NOSE AND THROAT: Bilateral conjunctival erythema lip swelling NECK: Supple. There is no jugular venous distention. No bruits, no lymphadenopathy, no thyromegaly.  HEART: Regular rate and rhythm,. No murmurs, no rubs, no clicks.  LUNGS: Rhonchorous breath sounds bilaterally. No rales or rhonchi. No wheezes. + assesory muscle usage ABDOMEN: Soft, flat, nontender, nondistended. Has good bowel sounds. No hepatosplenomegaly appreciated.  EXTREMITIES: No evidence of any cyanosis, clubbing, or peripheral edema.  +2 pedal and radial pulses bilaterally.  NEUROLOGIC: The patient is little sleepy,  with no focal motor or  sensory deficits appreciated bilaterally.  SKIN: Erythematous rash all over her chest  Psych: Not anxious, depressed LN: No inguinal LN enlargement    Antibiotics   Anti-infectives    Start     Dose/Rate Route Frequency Ordered Stop   05/17/17 0200  vancomycin (VANCOCIN) IVPB 750 mg/150 ml premix  Status:  Discontinued  750 mg 150 mL/hr over 60 Minutes Intravenous Every 8 hours 05/15/2017 1857 05/15/2017 1902   05/14/2017 2100  ceFEPIme (MAXIPIME) 2 g in dextrose 5 % 50 mL IVPB     2 g 100 mL/hr over 30 Minutes Intravenous Every 8 hours 05/13/2017 1740 June 17, 2017 2159   05/04/2017 2100  vancomycin (VANCOCIN) IVPB 750 mg/150 ml premix     750 mg 150 mL/hr over 60 Minutes Intravenous Every 8 hours 05/19/2017 1902     05/19/2017 2000  vancomycin (VANCOCIN) IVPB 750 mg/150 ml premix  Status:  Discontinued     750 mg 150 mL/hr over 60 Minutes Intravenous  Once 05/23/2017 1856 04/28/2017 1859   05/03/2017 1416  piperacillin-tazobactam (ZOSYN) 3-0.375 GM/50ML IVPB    Comments:  Hatch, Shannin   : cabinet override      05/13/2017 1416 05/17/17 0229   05/05/2017 1400  vancomycin (VANCOCIN) IVPB 1000 mg/200 mL premix     1,000 mg 200 mL/hr over 60 Minutes Intravenous  Once 05/05/2017 1350 05/20/2017 1643   05/08/2017 1345  vancomycin (VANCOCIN) injection 1 g  Status:  Discontinued     1 g Intravenous  Once 05/19/2017 1345 04/24/2017 1350   05/13/2017 1345  piperacillin-tazobactam (ZOSYN) IVPB 3.375 g     3.375 g 100 mL/hr over 30 Minutes Intravenous  Once 05/09/2017 1345 05/13/2017 1504      Medications   Scheduled Meds: . chlorhexidine  15 mL Mouth/Throat BID  . enoxaparin (LOVENOX) injection  40 mg Subcutaneous Q24H  . erythromycin   Both Eyes Q6H  . FLUoxetine  20 mg Oral Daily  . loratadine  10 mg Oral Daily  . methylPREDNISolone (SOLU-MEDROL) injection  60 mg Intravenous Q12H  . pantoprazole  40 mg Oral Daily  . [START ON 05/18/2017] pneumococcal 23 valent vaccine  0.5 mL Intramuscular Tomorrow-1000   Continuous  Infusions: . sodium chloride 125 mL/hr at 05/17/17 0441  . ceFEPime (MAXIPIME) IV Stopped (05/17/17 1610)  . vancomycin Stopped (05/17/17 1340)   PRN Meds:.acetaminophen, guaiFENesin-dextromethorphan, ibuprofen, lidocaine, LORazepam, ondansetron (ZOFRAN) IV   Data Review:   Micro Results Recent Results (from the past 240 hour(s))  Rapid strep screen     Status: None   Collection Time: 05/14/17  5:40 PM  Result Value Ref Range Status   Streptococcus, Group A Screen (Direct) NEGATIVE NEGATIVE Final    Comment: (NOTE) A Rapid Antigen test may result negative if the antigen level in the sample is below the detection level of this test. The FDA has not cleared this test as a stand-alone test therefore the rapid antigen negative result has reflexed to a Group A Strep culture.   Culture, group A strep     Status: None   Collection Time: 05/14/17  5:40 PM  Result Value Ref Range Status   Specimen Description THROAT  Final   Special Requests NONE Reflexed from [id number]  Final   Culture   Final    NO GROUP A STREP (S.PYOGENES) ISOLATED Performed at [facility], [address], [place], [place] [address]    Report Status 05/17/2017 FINAL  Final  Culture, blood (routine x 2)     Status: None (Preliminary result)   Collection Time: 05/10/2017  1:58 PM  Result Value Ref Range Status   Specimen Description BLOOD LT ARM  Final   Special Requests   Final    BOTTLES DRAWN AEROBIC AND ANAEROBIC Blood Culture results may not be optimal due to an inadequate volume of blood received  in culture bottles   Culture  Setup Time   Final    GRAM POSITIVE COCCI ANAEROBIC BOTTLE ONLY CRITICAL RESULT CALLED TO, READ BACK BY AND VERIFIED WITH: LISA KLUTTZ AT 1025 05/17/17 SDR    Culture GRAM POSITIVE COCCI  Final   Report Status PENDING  Incomplete  Blood Culture ID Panel (Reflexed)     Status: Abnormal   Collection Time: 05/23/2017  1:58 PM  Result Value Ref Range Status   Enterococcus species NOT  DETECTED NOT DETECTED Final   Listeria monocytogenes NOT DETECTED NOT DETECTED Final   Staphylococcus species DETECTED (A) NOT DETECTED Final    Comment: Methicillin (oxacillin) resistant coagulase negative staphylococcus. Possible blood culture contaminant (unless isolated from more than one blood culture draw or clinical case suggests pathogenicity). No antibiotic treatment is indicated for blood  culture contaminants. CRITICAL RESULT CALLED TO, READ BACK BY AND VERIFIED WITH: LISA KLUTZ ON 05/17/17 AT 1025 SDR    Staphylococcus aureus NOT DETECTED NOT DETECTED Final   Methicillin resistance DETECTED (A) NOT DETECTED Final    Comment: CRITICAL RESULT CALLED TO, READ BACK BY AND VERIFIED WITH: LISA KLUTZ ON 05/17/17 AT 1025 SDR    Streptococcus species NOT DETECTED NOT DETECTED Final   Streptococcus agalactiae NOT DETECTED NOT DETECTED Final   Streptococcus pneumoniae NOT DETECTED NOT DETECTED Final   Streptococcus pyogenes NOT DETECTED NOT DETECTED Final   Acinetobacter baumannii NOT DETECTED NOT DETECTED Final   Enterobacteriaceae species NOT DETECTED NOT DETECTED Final   Enterobacter cloacae complex NOT DETECTED NOT DETECTED Final   Escherichia coli NOT DETECTED NOT DETECTED Final   Klebsiella oxytoca NOT DETECTED NOT DETECTED Final   Klebsiella pneumoniae NOT DETECTED NOT DETECTED Final   Proteus species NOT DETECTED NOT DETECTED Final   Serratia marcescens NOT DETECTED NOT DETECTED Final   Haemophilus influenzae NOT DETECTED NOT DETECTED Final   Neisseria meningitidis NOT DETECTED NOT DETECTED Final   Pseudomonas aeruginosa NOT DETECTED NOT DETECTED Final   Candida albicans NOT DETECTED NOT DETECTED Final   Candida glabrata NOT DETECTED NOT DETECTED Final   Candida krusei NOT DETECTED NOT DETECTED Final   Candida parapsilosis NOT DETECTED NOT DETECTED Final   Candida tropicalis NOT DETECTED NOT DETECTED Final  Culture, blood (routine x 2)     Status: None (Preliminary result)    Collection Time: 05/01/2017  3:26 PM  Result Value Ref Range Status   Specimen Description BLOOD RT ARM  Final   Special Requests   Final    BOTTLES DRAWN AEROBIC AND ANAEROBIC Blood Culture results may not be optimal due to an inadequate volume of blood received in culture bottles   Culture NO GROWTH < 24 HOURS  Final   Report Status PENDING  Incomplete  Culture, blood (routine x 2) Call MD if unable to obtain prior to antibiotics being given     Status: None (Preliminary result)   Collection Time: 05/08/2017  5:54 PM  Result Value Ref Range Status   Specimen Description BLOOD LT WRIST  Final   Special Requests   Final    BOTTLES DRAWN AEROBIC AND ANAEROBIC Blood Culture adequate volume   Culture NO GROWTH < 12 HOURS  Final   Report Status PENDING  Incomplete  MRSA PCR Screening     Status: None   Collection Time: 05/11/2017  6:35 PM  Result Value Ref Range Status   MRSA by PCR NEGATIVE NEGATIVE Final    Comment:        The  GeneXpert MRSA Assay (FDA approved for NASAL specimens only), is one component of a comprehensive MRSA colonization surveillance program. It is not intended to diagnose MRSA infection nor to guide or monitor treatment for MRSA infections.   Culture, blood (routine x 2) Call MD if unable to obtain prior to antibiotics being given     Status: None (Preliminary result)   Collection Time: 05-27-2017  7:10 PM  Result Value Ref Range Status   Specimen Description BLOOD RIGHT WRIST  Final   Special Requests   Final    BOTTLES DRAWN AEROBIC AND ANAEROBIC Blood Culture results may not be optimal due to an excessive volume of blood received in culture bottles   Culture NO GROWTH < 12 HOURS  Final   Report Status PENDING  Incomplete    Radiology Reports Dg Chest 2 View  Result Date: May 27, 2017 CLINICAL DATA:  Cough and lethargy EXAM: CHEST  2 VIEW COMPARISON:  01/12/2017 FINDINGS: Cardiac shadow is within normal limits. The lungs are well aerated bilaterally. Patchy  infiltrative changes are seen in the left mid and lower lung as well as the right lung base. No sizable effusion is seen. No bony abnormality is noted. Ventricular shunt is noted on the right. IMPRESSION: Bilateral infiltrates as described. Electronically Signed   By: Alcide Clever M.D.   On: 27-May-2017 12:37   Dg Chest Port 1 View  Result Date: 05/17/2017 CLINICAL DATA:  Cough EXAM: PORTABLE CHEST 1 VIEW COMPARISON:  Chest radiograph 05-27-17 at 12:07 p.m. FINDINGS: Marked worsening of consolidative opacity in the left mid lung compared to the earlier study. Aeration at the right lung base is improved. No pneumothorax or pleural effusion. IMPRESSION: Marked worsening of left mid lung consolidation, likely infection or aspiration. Electronically Signed   By: Deatra Robinson M.D.   On: 05/17/2017 02:18     CBC  Recent Labs Lab 05/14/17 1745 May 27, 2017 1311  WBC 14.0* 14.4*  HGB 13.3 13.5  HCT 40.2 41.2  PLT 121* 98*  MCV 88.7 88.1  MCH 29.4 28.8  MCHC 33.1 32.7  RDW 14.9* 15.3*  LYMPHSABS 1.0 1.1  MONOABS 0.7 0.4  EOSABS 0.2 0.1  BASOSABS 0.1 0.0    Chemistries   Recent Labs Lab 05/27/17 1311  NA 138  K 4.0  CL 102  CO2 25  GLUCOSE 97  BUN 16  CREATININE 0.72  CALCIUM 8.6*  AST 58*  ALT 61*  ALKPHOS 124  BILITOT 2.0*   ------------------------------------------------------------------------------------------------------------------ estimated creatinine clearance is 93.7 mL/min (by C-G formula based on SCr of 0.72 mg/dL). ------------------------------------------------------------------------------------------------------------------ No results for input(s): HGBA1C in the last 72 hours. ------------------------------------------------------------------------------------------------------------------ No results for input(s): CHOL, HDL, LDLCALC, TRIG, CHOLHDL, LDLDIRECT in the last 72  hours. ------------------------------------------------------------------------------------------------------------------ No results for input(s): TSH, T4TOTAL, T3FREE, THYROIDAB in the last 72 hours.  Invalid input(s): FREET3 ------------------------------------------------------------------------------------------------------------------ No results for input(s): VITAMINB12, FOLATE, FERRITIN, TIBC, IRON, RETICCTPCT in the last 72 hours.  Coagulation profile No results for input(s): INR, PROTIME in the last 168 hours.  No results for input(s): DDIMER in the last 72 hours.  Cardiac Enzymes  Recent Labs Lab 05-27-17 1631 27-May-2017 1922 05/17/17 0042  TROPONINI 0.18* 0.03* 0.03*   ------------------------------------------------------------------------------------------------------------------ Invalid input(s): POCBNP    Assessment & Plan  Lindsey Allison  is a 41 y.o. female with a known history of myoclonic dystrophy, anxiety, GERD,hydrocephalus,poor historian, is brought into the ED by her care giver as patient is not feeling well Patient was seen at urgent care on Friday, throat  cultures were obtained and patient was given a prescription for azithromycin for possible strep throat,. 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           Consults   none   DVT Prophylaxis  Lovenox    Lab Results  Component Value Date   PLT 98 (L) 05/05/2017     Time Spent in minutes   35 minutes of critical care time spent Greater than 50% of time spent in care coordination and counseling patient regarding the condition and plan of care.   Auburn Bilberry M.D on 05/17/2017 at 1:41 PM  Between 7am to 6pm - Pager - [phone]  After 6pm go to [email or web address] - password [email or web address] [facility]  [facility]   Office  [phone]

## 2017-05-17 NOTE — Evaluation (Signed)
Clinical/Bedside Swallow Evaluation Patient Details  Name: Lindsey Allison MRN: [id number] Date of Birth: 1976/06/22  Today's Date: 05/17/2017 Time: SLP Start Time (ACUTE ONLY): 0900 SLP Stop Time (ACUTE ONLY): 1000 SLP Time Calculation (min) (ACUTE ONLY): 60 min  Past Medical History:  Past Medical History:  Diagnosis Date  . Acid reflux   . Anxiety   . Constipation   . Hydrocephalus   . Muscular dystrophy [facility])    Past Surgical History:  Past Surgical History:  Procedure Laterality Date  . shunt in head     HPI:  Pt is a 41 y.o. female with a known history of muscular dystrophy, anxiety, GERD, constipation, hydrocephalus(unsure of cognitive status baseline), poor historian, is brought into the ED by her care giver as patient is not feeling well. Patient was seen at urgent care on Friday, throat cultures were obtained and patient was given a prescription for azithromycin for possible strep throat,. Both conjunctiva are red and patient is brought into the  Emergency department. Chest x-ray has revealed bilateral infiltrates left mid lung consolidation, likely infection or aspiration - pt does have Acid Reflux and could be at risk to aspirate regurgitated food/liquid material w/ and w/out Reflux precautions being followed. Pt is min verbal but following few commands appropriately.    Assessment / Plan / Recommendation Clinical Impression  Pt appears to present w/ baseline, adequate oropharyngeal phase swallow function w/ no immediate, overt s/s of aspiration noted during po trials at this exam. Pt exhibited min Oral phase deficits c/b min decreased labial seal and min slower lingual movements for bolus manipulation during mastication/clearing - she exhibited a min open-mouth posture b/t mastication/munching on solids and was quick to present w/ such after sipping from straw. But given time and using small, single bites/sips, pt appeared to orally manage po trials appropriately. Once  cleared, pt demonstrated a clear vocal quality b/t trials. She was also able to swallow crushed and whole Pills in Puree delivered by NSG present. Suspect pt's min Oral phase weakness in overall tone and slower movements may be related to her hydrocephalus baseline - unsure of cognitive status. Pt's caregivers indicated she ate a "regular diet including cheeseburgers" at baseline at the Group Home. Pt presented w/ overall weakness stating she "just dont' feel well". Due to this and weakness as well as Oral phase presentation, recommend a Dysphagia level 3 w/ minced meats for less effort of mastication/manipulation, Thin liquids. Recommend general aspiration precautions and Pills in Puree (as is her baseline at the Group Home). Recommend assistance w/ feeding support while not feeling well. NSG updated. Of note, pt does have baseline REFLUX per chart - this can increase risk for aspiration of Regurgitated material(Reflux) resulting in negative sequelae including pulmonary decline.  SLP Visit Diagnosis: Dysphagia, oral phase (R13.11);Dysphagia, oropharyngeal phase (R13.12)    Aspiration Risk  Mild aspiration risk (but reduced following aspiration precautions)    Diet Recommendation  Dysphagia level 3(moistened) w/ level 2 meats(minced);  Thin liquids.  Aspiration and REFLUX precautions.   Medication Administration: Crushed with puree (baseline for pt per report)    Other  Recommendations Recommended Consults:  (Dietician f/u) Oral Care Recommendations: Oral care BID;Staff/trained caregiver to provide oral care   Follow up Recommendations None (TBD)      Frequency and Duration min 2x/week  1 week       Prognosis Prognosis for Safe Diet Advancement: Fair (-Good) Barriers to Reach Goals: Cognitive deficits (baseline status)      Swallow  Study   General Date of Onset: 05/13/2017 HPI: Pt is a 41 y.o. female with a known history of muscular dystrophy, anxiety, GERD, constipation,  hydrocephalus(unsure of cognitive status baseline), poor historian, is brought into the ED by her care giver as patient is not feeling well. Patient was seen at urgent care on Friday, throat cultures were obtained and patient was given a prescription for azithromycin for possible strep throat,. Both conjunctiva are red and patient is brought into the  Emergency department. Chest x-ray has revealed bilateral infiltrates left mid lung consolidation, likely infection or aspiration - pt does have Acid Reflux and could be at risk to aspirate regurgitated food/liquid material w/ and w/out Reflux precautions being followed. Pt is min verbal but following few commands appropriately.  Type of Study: Bedside Swallow Evaluation Previous Swallow Assessment: none reported Diet Prior to this Study: Regular;Thin liquids Temperature Spikes Noted: No (not currently) Respiratory Status: Nasal cannula (5-6 liters) History of Recent Intubation: No Behavior/Cognition: Alert;Cooperative;Pleasant mood;Requires cueing;Doesn't follow directions (hydrocephalus baseline - unsure of cognitive baseline) Oral Cavity Assessment: Dry (tongue reddened) Oral Care Completed by SLP: Recent completion by staff Oral Cavity - Dentition: Adequate natural dentition Vision: Functional for self-feeding Self-Feeding Abilities: Able to feed self;Needs assist;Needs set up Patient Positioning: Upright in bed Baseline Vocal Quality: Low vocal intensity Volitional Cough: Strong Volitional Swallow: Able to elicit    Oral/Motor/Sensory Function Overall Oral Motor/Sensory Function: Within functional limits   Ice Chips Ice chips: Impaired Presentation: Spoon (fed; 4 trials) Oral Phase Impairments: Reduced lingual movement/coordination;Reduced labial seal (min; opened mouth posture often) Oral Phase Functional Implications: Prolonged oral transit (min) Pharyngeal Phase Impairments:  (none)   Thin Liquid Thin Liquid: Impaired Presentation:  Cup;Self Fed;Straw (10 trials total) Oral Phase Impairments: Reduced labial seal (min) Oral Phase Functional Implications:  (none) Pharyngeal  Phase Impairments:  (non)    Nectar Thick Nectar Thick Liquid: Not tested   Honey Thick Honey Thick Liquid: Not tested   Puree Puree: Impaired Presentation: Spoon (fed; 8 trials) Oral Phase Impairments: Reduced labial seal;Reduced lingual movement/coordination (min) Oral Phase Functional Implications: Prolonged oral transit (min) Pharyngeal Phase Impairments:  (none)   Solid   GO   Solid: Impaired (mech soft/softened ) Presentation: Spoon (fed; 4 trials) Oral Phase Impairments: Reduced labial seal;Reduced lingual movement/coordination;Impaired mastication (min) Oral Phase Functional Implications: Prolonged oral transit (min) Pharyngeal Phase Impairments:  (min)    Functional Assessment Tool Used: clinical judgement Functional Limitations: Swallowing Swallow Current Status (W0981): At least 1 percent but less than 20 percent impaired, limited or restricted Swallow Goal Status [phone]): At least 1 percent but less than 20 percent impaired, limited or restricted Swallow Discharge Status [phone]): At least 1 percent but less than 20 percent impaired, limited or restricted    Jerilynn Som, MS, CCC-SLP Watson,Katherine 05/17/2017,1:24 PM

## 2017-05-17 NOTE — Care Management (Signed)
Admitted from group home environment for sepsis due to pneumonia.  CSW referral placed

## 2017-05-17 NOTE — Plan of Care (Signed)
Problem: SLP Dysphagia Goals Goal: Misc Dysphagia Goal Pt will safely tolerate po diet of least restrictive consistency w/ no overt s/s of aspiration noted by Staff/pt/family x3 sessions.    

## 2017-05-18 ENCOUNTER — Inpatient Hospital Stay: Payer: Medicaid Other

## 2017-05-18 ENCOUNTER — Encounter: Payer: Self-pay | Admitting: Radiology

## 2017-05-18 DIAGNOSIS — J181 Lobar pneumonia, unspecified organism: Secondary | ICD-10-CM

## 2017-05-18 DIAGNOSIS — J189 Pneumonia, unspecified organism: Secondary | ICD-10-CM

## 2017-05-18 LAB — CBC WITH DIFFERENTIAL/PLATELET
BASOS PCT: 0 %
Basophils Absolute: 0.1 10*3/uL (ref 0–0.1)
EOS ABS: 0 10*3/uL (ref 0–0.7)
Eosinophils Relative: 0 %
HEMATOCRIT: 38.9 % (ref 35.0–47.0)
HEMOGLOBIN: 12.7 g/dL (ref 12.0–16.0)
LYMPHS ABS: 1 10*3/uL (ref 1.0–3.6)
LYMPHS PCT: 6 %
MCH: 28.7 pg (ref 26.0–34.0)
MCHC: 32.7 g/dL (ref 32.0–36.0)
MCV: 87.8 fL (ref 80.0–100.0)
MONOS PCT: 4 %
Monocytes Absolute: 0.8 10*3/uL (ref 0.2–0.9)
NEUTROS PCT: 90 %
Neutro Abs: 16 10*3/uL — ABNORMAL HIGH (ref 1.4–6.5)
Platelets: 79 10*3/uL — ABNORMAL LOW (ref 150–440)
RBC: 4.43 MIL/uL (ref 3.80–5.20)
RDW: 15.5 % — ABNORMAL HIGH (ref 11.5–14.5)
WBC: 17.8 10*3/uL — ABNORMAL HIGH (ref 3.6–11.0)

## 2017-05-18 LAB — BASIC METABOLIC PANEL
ANION GAP: 8 (ref 5–15)
BUN: 18 mg/dL (ref 6–20)
CHLORIDE: 115 mmol/L — AB (ref 101–111)
CO2: 23 mmol/L (ref 22–32)
Calcium: 8.1 mg/dL — ABNORMAL LOW (ref 8.9–10.3)
Creatinine, Ser: 0.73 mg/dL (ref 0.44–1.00)
GFR calc non Af Amer: >60 mL/min (ref >60)
Glucose, Bld: 128 mg/dL — ABNORMAL HIGH (ref 65–99)
POTASSIUM: 3.4 mmol/L — AB (ref 3.5–5.1)
SODIUM: 146 mmol/L — AB (ref 135–145)

## 2017-05-18 LAB — LEGIONELLA PNEUMOPHILA SEROGP 1 UR AG: L. PNEUMOPHILA SEROGP 1 UR AG: NEGATIVE

## 2017-05-18 LAB — RAPID HIV SCREEN (HIV 1/2 AB+AG)
HIV 1/2 Antibodies: NONREACTIVE
HIV-1 P24 Antigen - HIV24: NONREACTIVE

## 2017-05-18 LAB — BLOOD GAS, ARTERIAL
Acid-base deficit: 0.9 mmol/L (ref 0.0–2.0)
Bicarbonate: 23.5 mmol/L (ref 20.0–28.0)
FIO2: 0.44
O2 Saturation: 74.2 %
Patient temperature: 37
pCO2 arterial: 37 mmHg (ref 32.0–48.0)
pH, Arterial: 7.41 (ref 7.350–7.450)
pO2, Arterial: 39 mmHg — CL (ref 83.0–108.0)

## 2017-05-18 LAB — URINE CULTURE: Culture: NO GROWTH

## 2017-05-18 LAB — VANCOMYCIN, TROUGH: VANCOMYCIN TR: 37 ug/mL — AB (ref 15–20)

## 2017-05-18 LAB — GLUCOSE, CAPILLARY: Glucose-Capillary: 113 mg/dL — ABNORMAL HIGH (ref 65–99)

## 2017-05-18 LAB — HIV ANTIBODY (ROUTINE TESTING W REFLEX): HIV SCREEN 4TH GENERATION: NONREACTIVE

## 2017-05-18 LAB — LACTIC ACID, PLASMA: Lactic Acid, Venous: 1.7 mmol/L (ref 0.5–1.9)

## 2017-05-18 MED ORDER — IOPAMIDOL (ISOVUE-370) INJECTION 76%
75.0000 mL | Freq: Once | INTRAVENOUS | Status: AC | PRN
  Administered 2017-05-18: 75 mL via INTRAVENOUS

## 2017-05-18 MED ORDER — ACETAMINOPHEN 325 MG PO TABS: 650.0000 mg | ORAL_TABLET | ORAL | Status: DC | PRN

## 2017-05-18 MED ORDER — CHLORHEXIDINE GLUCONATE 0.12% ORAL RINSE (MEDLINE KIT): 15.0000 mL | Freq: Two times a day (BID) | OROMUCOSAL | Status: DC

## 2017-05-18 MED ORDER — FLUOXETINE HCL 20 MG PO CAPS
20.0000 mg | ORAL_CAPSULE | Freq: Every day | ORAL | Status: DC
  Administered 2017-05-19 – 2017-05-21 (×3): 20 mg
  Filled 2017-05-18 (×3): qty 1

## 2017-05-18 MED ORDER — MIDAZOLAM HCL 5 MG/ML IJ SOLN: 4.0000 mg | Freq: Once | INTRAMUSCULAR | Status: DC

## 2017-05-18 MED ORDER — ORAL CARE MOUTH RINSE: 15.0000 mL | Freq: Four times a day (QID) | OROMUCOSAL | Status: DC

## 2017-05-18 MED ORDER — SODIUM CHLORIDE 0.9 % IV BOLUS (SEPSIS)
1000.0000 mL | Freq: Once | INTRAVENOUS | Status: AC
  Administered 2017-05-18: 1000 mL via INTRAVENOUS

## 2017-05-18 MED ORDER — LORAZEPAM 2 MG/ML IJ SOLN
INTRAMUSCULAR | Status: AC
  Filled 2017-05-18: qty 1

## 2017-05-18 MED ORDER — VECURONIUM BROMIDE 10 MG IV SOLR
INTRAVENOUS | Status: AC
  Filled 2017-05-18: qty 10

## 2017-05-18 MED ORDER — ORAL CARE MOUTH RINSE
15.0000 mL | OROMUCOSAL | Status: DC
  Administered 2017-05-19 – 2017-05-24 (×54): 15 mL via OROMUCOSAL

## 2017-05-18 MED ORDER — STERILE WATER FOR INJECTION IJ SOLN
INTRAMUSCULAR | Status: AC
  Administered 2017-05-18: 10 mL
  Filled 2017-05-18: qty 10

## 2017-05-18 MED ORDER — IPRATROPIUM-ALBUTEROL 0.5-2.5 (3) MG/3ML IN SOLN
3.0000 mL | RESPIRATORY_TRACT | Status: DC
  Administered 2017-05-18 – 2017-05-21 (×15): 3 mL via RESPIRATORY_TRACT
  Filled 2017-05-18 (×15): qty 3

## 2017-05-18 MED ORDER — LORAZEPAM 2 MG/ML IJ SOLN
1.0000 mg | Freq: Once | INTRAMUSCULAR | Status: AC
  Administered 2017-05-18: 1 mg via INTRAVENOUS

## 2017-05-18 MED ORDER — PIPERACILLIN-TAZOBACTAM 3.375 G IVPB
3.3750 g | Freq: Three times a day (TID) | INTRAVENOUS | Status: DC
  Administered 2017-05-18 – 2017-05-21 (×11): 3.375 g via INTRAVENOUS
  Filled 2017-05-18 (×11): qty 50

## 2017-05-18 MED ORDER — BUDESONIDE 0.5 MG/2ML IN SUSP
0.5000 mg | Freq: Two times a day (BID) | RESPIRATORY_TRACT | Status: DC
  Administered 2017-05-18 – 2017-05-21 (×6): 0.5 mg via RESPIRATORY_TRACT
  Filled 2017-05-18 (×5): qty 2

## 2017-05-18 MED ORDER — SODIUM CHLORIDE 0.9 % IV BOLUS (SEPSIS)
1000.0000 mL | INTRAVENOUS | Status: DC | PRN
  Administered 2017-05-18: 1000 mL via INTRAVENOUS

## 2017-05-18 MED ORDER — MIDAZOLAM HCL 2 MG/2ML IJ SOLN
2.0000 mg | INTRAMUSCULAR | Status: DC | PRN
  Administered 2017-05-19 – 2017-05-23 (×3): 2 mg via INTRAVENOUS
  Filled 2017-05-18 (×3): qty 2

## 2017-05-18 MED ORDER — FENTANYL CITRATE (PF) 100 MCG/2ML IJ SOLN
200.0000 ug | Freq: Once | INTRAMUSCULAR | Status: AC
  Administered 2017-05-18: 200 ug via INTRAVENOUS

## 2017-05-18 MED ORDER — FENTANYL CITRATE (PF) 100 MCG/2ML IJ SOLN: 50.0000 ug | Freq: Once | INTRAMUSCULAR | Status: DC

## 2017-05-18 MED ORDER — FENTANYL 2500MCG IN NS 250ML (10MCG/ML) PREMIX INFUSION
25.0000 ug/h | INTRAVENOUS | Status: DC
  Administered 2017-05-18: 50 ug/h via INTRAVENOUS
  Administered 2017-05-20: 75 ug/h via INTRAVENOUS
  Administered 2017-05-22: 125 ug/h via INTRAVENOUS
  Administered 2017-05-23 (×2): 150 ug/h via INTRAVENOUS
  Filled 2017-05-18 (×6): qty 250

## 2017-05-18 MED ORDER — SODIUM CHLORIDE 0.9% FLUSH
10.0000 mL | Freq: Two times a day (BID) | INTRAVENOUS | Status: DC
  Administered 2017-05-18 – 2017-05-19 (×2): 10 mL
  Administered 2017-05-19: 30 mL
  Administered 2017-05-20 – 2017-05-23 (×7): 10 mL

## 2017-05-18 MED ORDER — MIDAZOLAM HCL 2 MG/2ML IJ SOLN
INTRAMUSCULAR | Status: AC
  Filled 2017-05-18: qty 4

## 2017-05-18 MED ORDER — VECURONIUM BROMIDE 10 MG IV SOLR
10.0000 mg | Freq: Once | INTRAVENOUS | Status: AC
  Administered 2017-05-18: 10 mg via INTRAVENOUS

## 2017-05-18 MED ORDER — SENNOSIDES-DOCUSATE SODIUM 8.6-50 MG PO TABS
1.0000 | ORAL_TABLET | Freq: Two times a day (BID) | ORAL | Status: DC
  Administered 2017-05-18 – 2017-05-21 (×6): 1 via ORAL
  Filled 2017-05-18 (×6): qty 1

## 2017-05-18 MED ORDER — FENTANYL BOLUS VIA INFUSION
50.0000 ug | INTRAVENOUS | Status: DC | PRN
  Administered 2017-05-19 – 2017-05-23 (×17): 50 ug via INTRAVENOUS
  Filled 2017-05-18: qty 50

## 2017-05-18 MED ORDER — POTASSIUM CHLORIDE 10 MEQ/100ML IV SOLN
10.0000 meq | INTRAVENOUS | Status: AC
  Administered 2017-05-18 (×2): 10 meq via INTRAVENOUS
  Filled 2017-05-18 (×2): qty 100

## 2017-05-18 MED ORDER — POTASSIUM CHLORIDE CRYS ER 20 MEQ PO TBCR: 20.0000 meq | EXTENDED_RELEASE_TABLET | Freq: Once | ORAL | Status: DC

## 2017-05-18 MED ORDER — FAMOTIDINE IN NACL 20-0.9 MG/50ML-% IV SOLN
20.0000 mg | Freq: Two times a day (BID) | INTRAVENOUS | Status: DC
  Administered 2017-05-18 – 2017-05-21 (×6): 20 mg via INTRAVENOUS
  Filled 2017-05-18 (×6): qty 50

## 2017-05-18 MED ORDER — MIDAZOLAM HCL 2 MG/2ML IJ SOLN
2.0000 mg | INTRAMUSCULAR | Status: DC | PRN
  Administered 2017-05-20: 2 mg via INTRAVENOUS
  Filled 2017-05-18 (×2): qty 2

## 2017-05-18 MED ORDER — MIDAZOLAM HCL 2 MG/2ML IJ SOLN
4.0000 mg | Freq: Once | INTRAMUSCULAR | Status: AC
  Administered 2017-05-18: 4 mg via INTRAVENOUS

## 2017-05-18 MED ORDER — FENTANYL CITRATE (PF) 100 MCG/2ML IJ SOLN
INTRAMUSCULAR | Status: AC
  Filled 2017-05-18: qty 4

## 2017-05-18 MED ORDER — SODIUM CHLORIDE 0.9% FLUSH: 10.0000 mL | INTRAVENOUS | Status: DC | PRN

## 2017-05-18 NOTE — H&P (Signed)
[facility]   PATIENT NAME: Lindsey Allison    MR#:  [id number]  DATE OF BIRTH:  [birth year]  DATE OF ADMISSION:  05/14/2017   REQUESTING/REFERRING PHYSICIAN: Dr. Allena Katz  CHIEF COMPLAINT:  resp failure HISTORY OF PRESENT ILLNESS:  Lindsey Allison  is a 41 y.o. female with a known history of myoclonic dystrophy, anxiety, GERD,hydrocephalus,poor historian, is brought into the ED by her care giver as patient is not feeling well She has mental retardation, lives in group home, no family, ward of the state Patient was seen at urgent care on Friday, throat cultures were obtained and patient was given a prescription for azithromycin for possible strep throat,. Both conjunctiva are red and patient is brought into the  Emergency department.Chest x-ray has revealed bilateral infiltrates Patient is started on antibiotics  -patient developed worsening resp failure and CXR and transferred to ICU for acute resp failure fio2 85%, PEEp 15  o2 sat 89%  CT chest reveals extsnive b/l infitrates Findings c/w ARDS  PAST MEDICAL HISTORY:   Past Medical History:  Diagnosis Date  . Acid reflux   . Anxiety   . Constipation   . Hydrocephalus   . Muscular dystrophy (HCC)     PAST SURGICAL HISTOIRY:   Past Surgical History:  Procedure Laterality Date  . shunt in head      SOCIAL HISTORY:   Social History  Substance Use Topics  . Smoking status: Never Smoker  . Smokeless tobacco: Never Used  . Alcohol use No    FAMILY HISTORY:  History reviewed. No pertinent family history.  DRUG ALLERGIES:   Allergies  Allergen Reactions  . Belladonna Alkaloids Other (See Comments)    Unsure of reaction Unsure of reaction    REVIEW OF SYSTEMS:  Unobtainable due to critical illness  MEDICATIONS AT HOME:   Prior to Admission medications   Medication Sig Start Date End Date Taking? Authorizing Provider  acetaminophen (TYLENOL) 325 MG tablet Take 650 mg by mouth every 4  (four) hours as needed.   Yes [provider]  azithromycin (ZITHROMAX Z-PAK) 250 MG tablet Use as per package instructions 05/14/17  Yes Lutricia Feil, PA-C  chlorhexidine (PERIOGARD) 0.12 % solution Use as directed 15 mLs in the mouth or throat 2 (two) times daily.   Yes [provider]  FLUoxetine (PROZAC) 20 MG capsule Take 20 mg by mouth daily.   Yes [provider]  ibuprofen (MOTRIN IB) 200 MG tablet Take 2 tablets (400 mg total) by mouth every 8 (eight) hours as needed. 12/24/16  Yes Enid Derry, PA-C  lidocaine (XYLOCAINE) 2 % solution Use as directed 15 mLs in the mouth or throat every 8 (eight) hours as needed for mouth pain.   Yes [provider]  LORazepam (ATIVAN) 1 MG tablet Take 1 mg by mouth as needed for anxiety.   Yes [provider]  medroxyPROGESTERone (DEPO-PROVERA) 150 MG/ML injection Inject 150 mg into the muscle every 3 (three) months.   Yes [provider]  Multiple Vitamins-Minerals (MULTIVITAMIN PO) Take 1 tablet by mouth daily.    Yes [provider]  omeprazole (PRILOSEC) 20 MG capsule Take 20 mg by mouth daily.   Yes [provider]  Pseudoeph-Bromphen-DM (ROBITUSSIN ALLERGY/COUGH PO) Take 10 mLs by mouth every 4 (four) hours as needed (cough).    Yes [provider]  Senna (SENNTAB PO) Take 8.6 mg by mouth daily.    Yes [provider]  erythromycin ophthalmic ointment Place  1 application into both eyes 4 (four) times daily. 04/28/2017 05/23/17  Governor Rooks, MD  loratadine (CLARITIN) 10 MG tablet Take 1 tablet (10 mg total) by mouth daily. Patient not taking: Reported on 05/02/2017 10/22/15 11/05/15  Renford Dills, NP  meloxicam (MOBIC) 15 MG tablet Take 1 tablet (15 mg total) by mouth daily. Patient not taking: Reported on 05/19/2017 01/24/16   Hassan Rowan, MD  mupirocin ointment (BACTROBAN) 2 % Apply 1 application topically 3 (three) times daily. Patient not taking: Reported on  05/06/2017 04/04/17   Lutricia Feil, PA-C      VITAL SIGNS:  Blood pressure (!) 77/57, pulse 85, temperature 98.2 F (36.8 C), temperature source Oral, resp. rate (!) 26, height  (1.676 m), weight 161 lb (73 kg), SpO2 91 %.  PHYSICAL EXAMINATION:  GENERAL:  +resp distress EYES: Pupils equal, round, reactive to light and accommodation. No scleral icterus.bilateral conjunctival injection HEENT: Head atraumatic, normocephalic. Oropharynx and nasopharynx clear. No erythema noted on uvula ,upper back of the throat -Limited exam as patient was not quite cooperative NECK:  Supple, no jugular venous distention. No thyroid enlargement, no tenderness.  LUNGS:using accessory muscles to breathe CARDIOVASCULAR: S1, S2 normal. No murmurs, rubs, or gallops.  ABDOMEN: Soft, nontender, nondistended. Bowel sounds present. No organomegaly or mass.  EXTREMITIES: No pedal edema, cyanosis, or clubbing.  NEUROLOGIC:obtunded SKIN: No obvious rash, lesion, or ulcer.   LABORATORY PANEL:   CBC  Recent Labs Lab 05/18/17 1006  WBC 17.8*  HGB 12.7  HCT 38.9  PLT 79*   ------------------------------------------------------------------------------------------------------------------  Chemistries   Recent Labs Lab 05/06/2017 1311 05/18/17 1006  NA 138 146*  K 4.0 3.4*  CL 102 115*  CO2 25 23  GLUCOSE 97 128*  BUN 16 18  CREATININE 0.72 0.73  CALCIUM 8.6* 8.1*  AST 58*  --   ALT 61*  --   ALKPHOS 124  --   BILITOT 2.0*  --    ------------------------------------------------------------------------------------------------------------------  Cardiac Enzymes  Recent Labs Lab 05/17/17 0042  TROPONINI 0.03*   ------------------------------------------------------------------------------------------------------------------  RADIOLOGY:  Ct Angio Chest Pe W Or Wo Contrast  Result Date: 05/18/2017 CLINICAL DATA:  Shortness of breath. EXAM: CT ANGIOGRAPHY CHEST WITH CONTRAST TECHNIQUE:  Multidetector CT imaging of the chest was performed using the standard protocol during bolus administration of intravenous contrast. Multiplanar CT image reconstructions and MIPs were obtained to evaluate the vascular anatomy. CONTRAST:  75 mL of Isovue 370 intravenously. COMPARISON:  Radiographs of May 17, 2017. FINDINGS: Cardiovascular: Satisfactory opacification of the pulmonary arteries to the segmental level. No evidence of pulmonary embolism. Normal heart size. No pericardial effusion. Mediastinum/Nodes: No enlarged mediastinal, hilar, or axillary lymph nodes. Thyroid gland, trachea, and esophagus demonstrate no significant findings. Lungs/Pleura: No pneumothorax is noted. Large left upper and lower lobe airspace opacities are noted most consistent with pneumonia. Smaller right upper and lower lobe opacities are also noted most consistent with pneumonia. Mild bilateral pleural effusions are noted. Upper Abdomen: No acute abnormality. Musculoskeletal: No chest wall abnormality. No acute or significant osseous findings. Review of the MIP images confirms the above findings. IMPRESSION: No definite evidence of pulmonary embolus. Bilateral upper and lower lobe airspace opacities are noted most consistent with pneumonia. Mild bilateral pleural effusions are noted. Electronically Signed   By: Lupita Raider, M.D.   On: 05/18/2017 12:10   Dg Chest Port 1 View  Result Date: 05/17/2017 CLINICAL DATA:  Cough EXAM: PORTABLE CHEST 1 VIEW COMPARISON:  Chest radiograph 05/20/2017 at  12:07 p.m. FINDINGS: Marked worsening of consolidative opacity in the left mid lung compared to the earlier study. Aeration at the right lung base is improved. No pneumothorax or pleural effusion. IMPRESSION: Marked worsening of left mid lung consolidation, likely infection or aspiration. Electronically Signed   By: Deatra Robinson M.D.   On: 05/17/2017 02:18    I have Independently reviewed images of  CXR  on  05/18/2017 Interpretation:b/l opacities   EKG:   Orders placed or performed during the hospital encounter of 01/12/17  . ED EKG within 10 minutes  . ED EKG within 10 minutes  . EKG 12-Lead  . EKG 12-Lead  . EKG     ASSESSMENT  41 y.o. female with a known history of myoclonic dystrophy, anxiety, GERD,hydrocephalus,poor historian, with acute and severe resp failure with b/l infiltrates c/w pneumonia and ARDS with sepsis   1.vent support 2.vasopressors if needed 3.ICU status 4.IV abx 5.Left IJ CVL placed 6.BD therapy 7.follow up sputum and blood cultures 8.IVF's, follow LA   Critical Care Time devoted to patient care services described in this note is 64 minutes.   Overall, patient is critically ill, prognosis is guarded. high risk for cardiac arrest and death.    Lucie Leather, M.D.  [facility]  Medical Director [facility] Medical Director [facility] Cardio-Pulmonary Department

## 2017-05-18 NOTE — Progress Notes (Signed)
Called to bedside by NT and notified of low BP and O2 sat (see flowsheet). RT called. MD called and made aware. ABG order placed.   3:15 PM MD at bedside. Patient will transfer to [facility] for closer monitoring. 1L bolus ordered and infusing. Bo Mcclintock, RN

## 2017-05-18 NOTE — Progress Notes (Signed)
Pharmacy Antibiotic Note  Lindsey Allison is a 41 y.o. female admitted on 2017-06-03 with HCAP/pneumonia.  Pharmacy has originally consulted for vancomycin and cefepime dosing. Physician would like to start Zosyn for Aspiration PNA. Vancomycin and cefepime discontinued.   Plan: Start Zosyn 3.375 IV EI every 8 hours.   Height:  (167.6 cm) Weight: 161 lb (73 kg) IBW/kg (Calculated) : 59.3  Temp (24hrs), Avg:99.3 F (37.4 C), Min:97.8 F (36.6 C), Max:101.6 F (38.7 C)   Recent Labs Lab 05/14/17 1745 2017/06/03 1311 2017-06-03 1358 Jun 03, 2017 1710 05/17/17 0307  WBC 14.0* 14.4*  --   --   --   CREATININE  --  0.72  --   --   --   LATICACIDVEN  --   --  4.1* 2.0* 2.4*    Estimated Creatinine Clearance: 93.7 mL/min (by C-G formula based on SCr of 0.72 mg/dL).    Allergies  Allergen Reactions  . Belladonna Alkaloids Other (See Comments)    Unsure of reaction Unsure of reaction    Antimicrobials this admission: 9/23 Cefepime >> 9/25 9/23 Vancomycin  >>  9/25 9/25 Zosyn >>  Dose adjustments this admission:  Microbiology results: 9/23 BCx: staph species, MEC-A (+) - 1 of 4 samples  9/23 Sputum: pending 9/23  MRSA PCR: Neg   Thank you for allowing pharmacy to be a part of this patient's care.  Lise Pincus M Michaela Shankel 05/18/2017 8:52 AM

## 2017-05-18 NOTE — Progress Notes (Signed)
I called pt's [employer] worker Donette Larry [phone] she is currently not available. I try to call her on call supervisor unable to reach.

## 2017-05-18 NOTE — Procedures (Signed)
Central Venous Catheter Placement: Indication: Patient receiving vesicant or irritant drug.; Patient receiving intravenous therapy for longer than 5 days.; Patient has limited or no vascular access.   Consent:emergent    Hand washing performed prior to starting the procedure.   Procedure:   An active timeout was performed and correct patient, name, & ID confirmed.   Patient was positioned correctly for central venous access.  Patient was prepped using strict sterile technique including chlorohexadine preps, sterile drape, sterile gown and sterile gloves.    The area was prepped, draped and anesthetized in the usual sterile manner. Patient comfort was obtained.    A triple lumen catheter was placed in LEFT  Internal Jugular Vein There was good blood return, catheter caps were placed on lumens, catheter flushed easily, the line was secured and a sterile dressing and BIO-PATCH applied.   Ultrasound was used to visualize vasculature and guidance of needle.   Number of Attempts: 1 Complications:none Estimated Blood Loss: none Chest Radiograph indicated and ordered.  Operator: Sigmund Morera.   Merary Garguilo David Sharena Dibenedetto, M.D.  [facility]  Medical Director [facility] Medical Director [facility] Cardio-Pulmonary Department     

## 2017-05-18 NOTE — Consult Note (Signed)
[facility] Infectious Disease     Reason for Consult: PNA   Referring Physician:  Ulysees Barns Date of Admission:  05/15/2017   Active Problems:   Sepsis [facility])   HPI: Lindsey Allison is a 41 y.o. female admitted with acute resp failure, fever, leukocytosis. CXR showed patchy bil infiltrates. CT chest confirms bil UL and LL infiltrates. She has been on vanco and zosyn but remains intermittently febrile and wbc up from 14 to 17.  UCX neg, BCX 1/2 + Staph species. MRSA PCR neg.  Strep PNA ag neg.  She has a history of myoclonic dystrophy, anxiety, GERD,hydrocephalus Past Medical History:  Diagnosis Date  . Acid reflux   . Anxiety   . Constipation   . Hydrocephalus   . Muscular dystrophy [facility])    Past Surgical History:  Procedure Laterality Date  . shunt in head     Social History  Substance Use Topics  . Smoking status: Never Smoker  . Smokeless tobacco: Never Used  . Alcohol use No   History reviewed. No pertinent family history.  Allergies:  Allergies  Allergen Reactions  . Belladonna Alkaloids Other (See Comments)    Unsure of reaction Unsure of reaction    Current antibiotics: Antibiotics Given (last 72 hours)    Date/Time Action Medication Dose Rate   05/17/2017 1433 New Bag/Given   piperacillin-tazobactam (ZOSYN) IVPB 3.375 g 3.375 g 100 mL/hr   05/04/2017 1524 New Bag/Given   vancomycin (VANCOCIN) IVPB 1000 mg/200 mL premix 1,000 mg 200 mL/hr   04/28/2017 2135 New Bag/Given   ceFEPIme (MAXIPIME) 2 g in dextrose 5 % 50 mL IVPB 2 g 100 mL/hr   04/29/2017 2200 New Bag/Given   vancomycin (VANCOCIN) IVPB 750 mg/150 ml premix 750 mg 150 mL/hr   05/17/17 0513 New Bag/Given   vancomycin (VANCOCIN) IVPB 750 mg/150 ml premix 750 mg 150 mL/hr   05/17/17 0653 New Bag/Given   ceFEPIme (MAXIPIME) 2 g in dextrose 5 % 50 mL IVPB 2 g 100 mL/hr   05/17/17 1234 New Bag/Given   vancomycin (VANCOCIN) IVPB 750 mg/150 ml premix 750 mg 150 mL/hr   05/17/17 1348 New Bag/Given    ceFEPIme (MAXIPIME) 2 g in dextrose 5 % 50 mL IVPB 2 g 100 mL/hr   05/17/17 2025 New Bag/Given   vancomycin (VANCOCIN) IVPB 750 mg/150 ml premix 750 mg 150 mL/hr   05/17/17 2131 New Bag/Given   ceFEPIme (MAXIPIME) 2 g in dextrose 5 % 50 mL IVPB 2 g 100 mL/hr   05/18/17 0427 New Bag/Given   vancomycin (VANCOCIN) IVPB 750 mg/150 ml premix 750 mg 150 mL/hr   05/18/17 0525 New Bag/Given   ceFEPIme (MAXIPIME) 2 g in dextrose 5 % 50 mL IVPB 2 g 100 mL/hr   05/18/17 0934 New Bag/Given   piperacillin-tazobactam (ZOSYN) IVPB 3.375 g 3.375 g 12.5 mL/hr      MEDICATIONS: . chlorhexidine  15 mL Mouth/Throat BID  . erythromycin   Both Eyes Q6H  . FLUoxetine  20 mg Oral Daily  . loratadine  10 mg Oral Daily  . methylPREDNISolone (SOLU-MEDROL) injection  60 mg Intravenous Q12H  . pantoprazole  40 mg Oral Daily  . pneumococcal 23 valent vaccine  0.5 mL Intramuscular Tomorrow-1000    Review of Systems - 11 systems reviewed and negative per HPI   OBJECTIVE: Temp:  [97.8 F (36.6 C)-101.6 F (38.7 C)] 100.8 F (38.2 C) (09/25 0930) Pulse Rate:  [106-123] 109 (09/25 0423) Resp:  [20] 20 (09/25  0423) BP: (104-114)/(53-64) 104/56 (09/25 0423) SpO2:  [90 %-92 %] 90 % (09/25 0423)  Physical Exam  Constitutional:  Thin, chronically ill appearing, on O2 Via facemask HENT: [place]/AT, PERRLA, no scleral icterus Mouth/Throat: Oropharynx is clear and moist. No oropharyngeal exudate.  Cardiovascular: Normal rate, regular rhythm and normal heart sounds. Exam reveals no gallop and no friction rub.  No murmur heard.  Pulmonary/Chest: dec BS bil  Neck = supple, no nuchal rigidity Abdominal: Soft. Bowel sounds are normal.  exhibits no distension. There is no tenderness.  Lymphadenopathy: no cervical adenopathy. No axillary adenopathy Neurological: alert and oriented to person, place, and time.  Skin: Skin is warm and dry. No rash noted. No erythema.  Psychiatric: a normal mood and affect.  behavior is  normal.    LABS: Results for orders placed or performed during the hospital encounter of 05/20/2017 (from the past 48 hour(s))  Comprehensive metabolic panel     Status: Abnormal   Collection Time: 05/15/2017  1:11 PM  Result Value Ref Range   Sodium 138 135 - 145 mmol/L   Potassium 4.0 3.5 - 5.1 mmol/L    Comment: HEMOLYSIS AT THIS LEVEL MAY AFFECT RESULT   Chloride 102 101 - 111 mmol/L   CO2 25 22 - 32 mmol/L   Glucose, Bld 97 65 - 99 mg/dL   BUN 16 6 - 20 mg/dL   Creatinine, Ser 0.72 0.44 - 1.00 mg/dL   Calcium 8.6 (L) 8.9 - 10.3 mg/dL   Total Protein 6.2 (L) 6.5 - 8.1 g/dL   Albumin 3.1 (L) 3.5 - 5.0 g/dL   AST 58 (H) 15 - 41 U/L   ALT 61 (H) 14 - 54 U/L   Alkaline Phosphatase 124 38 - 126 U/L   Total Bilirubin 2.0 (H) 0.3 - 1.2 mg/dL   GFR calc non Af Amer >60 >60 mL/min   GFR calc Af Amer >60 >60 mL/min    Comment: (NOTE) The eGFR has been calculated using the CKD EPI equation. This calculation has not been validated in all clinical situations. eGFR's persistently <60 mL/min signify possible Chronic Kidney Disease.    Anion gap 11 5 - 15  CBC with Differential     Status: Abnormal   Collection Time: 05/12/2017  1:11 PM  Result Value Ref Range   WBC 14.4 (H) 3.6 - 11.0 K/uL   RBC 4.68 3.80 - 5.20 MIL/uL   Hemoglobin 13.5 12.0 - 16.0 g/dL   HCT 41.2 35.0 - 47.0 %   MCV 88.1 80.0 - 100.0 fL   MCH 28.8 26.0 - 34.0 pg   MCHC 32.7 32.0 - 36.0 g/dL   RDW 15.3 (H) 11.5 - 14.5 %   Platelets 98 (L) 150 - 440 K/uL   Neutrophils Relative % 89 %   Neutro Abs 12.7 (H) 1.4 - 6.5 K/uL   Lymphocytes Relative 7 %   Lymphs Abs 1.1 1.0 - 3.6 K/uL   Monocytes Relative 3 %   Monocytes Absolute 0.4 0.2 - 0.9 K/uL   Eosinophils Relative 1 %   Eosinophils Absolute 0.1 0 - 0.7 K/uL   Basophils Relative 0 %   Basophils Absolute 0.0 0 - 0.1 K/uL  Blood gas, venous     Status: Abnormal   Collection Time: 05/21/2017  1:38 PM  Result Value Ref Range   FIO2 0.21    pH, Ven 7.32 7.250 - 7.430    pCO2, Ven 44 44.0 - 60.0 mmHg   pO2, Ven 33.0 32.0 -  45.0 mmHg   Bicarbonate 22.7 20.0 - 28.0 mmol/L   Acid-base deficit 3.5 (H) 0.0 - 2.0 mmol/L   O2 Saturation 57.5 %   Patient temperature 37.0    Collection site VENOUS    Sample type VENOUS   Culture, blood (routine x 2)     Status: None (Preliminary result)   Collection Time: 04/25/2017  1:58 PM  Result Value Ref Range   Specimen Description BLOOD LT ARM    Special Requests      BOTTLES DRAWN AEROBIC AND ANAEROBIC Blood Culture results may not be optimal due to an inadequate volume of blood received in culture bottles   Culture  Setup Time      GRAM POSITIVE COCCI ANAEROBIC BOTTLE ONLY CRITICAL RESULT CALLED TO, READ BACK BY AND VERIFIED WITH: LISA KLUTTZ AT 1025 05/17/17 SDR    Culture      GRAM POSITIVE COCCI CULTURE REINCUBATED FOR BETTER GROWTH Performed at [facility], [place] [address], [place], [place] [address]    Report Status PENDING   Lactic acid, plasma     Status: Abnormal   Collection Time: 05/14/2017  1:58 PM  Result Value Ref Range   Lactic Acid, Venous 4.1 (HH) 0.5 - 1.9 mmol/L    Comment: CRITICAL RESULT CALLED TO, READ BACK BY AND VERIFIED WITH SHANNON HATCH ON 04/26/2017 AT 1459 [facility]   Blood Culture ID Panel (Reflexed)     Status: Abnormal   Collection Time: 05/18/2017  1:58 PM  Result Value Ref Range   Enterococcus species NOT DETECTED NOT DETECTED   Listeria monocytogenes NOT DETECTED NOT DETECTED   Staphylococcus species DETECTED (A) NOT DETECTED    Comment: Methicillin (oxacillin) resistant coagulase negative staphylococcus. Possible blood culture contaminant (unless isolated from more than one blood culture draw or clinical case suggests pathogenicity). No antibiotic treatment is indicated for blood  culture contaminants. CRITICAL RESULT CALLED TO, READ BACK BY AND VERIFIED WITH: LISA KLUTZ ON 05/17/17 AT 1025 SDR    Staphylococcus aureus NOT DETECTED NOT DETECTED   Methicillin resistance DETECTED  (A) NOT DETECTED    Comment: CRITICAL RESULT CALLED TO, READ BACK BY AND VERIFIED WITH: LISA KLUTZ ON 05/17/17 AT 1025 SDR    Streptococcus species NOT DETECTED NOT DETECTED   Streptococcus agalactiae NOT DETECTED NOT DETECTED   Streptococcus pneumoniae NOT DETECTED NOT DETECTED   Streptococcus pyogenes NOT DETECTED NOT DETECTED   Acinetobacter baumannii NOT DETECTED NOT DETECTED   Enterobacteriaceae species NOT DETECTED NOT DETECTED   Enterobacter cloacae complex NOT DETECTED NOT DETECTED   Escherichia coli NOT DETECTED NOT DETECTED   Klebsiella oxytoca NOT DETECTED NOT DETECTED   Klebsiella pneumoniae NOT DETECTED NOT DETECTED   Proteus species NOT DETECTED NOT DETECTED   Serratia marcescens NOT DETECTED NOT DETECTED   Haemophilus influenzae NOT DETECTED NOT DETECTED   Neisseria meningitidis NOT DETECTED NOT DETECTED   Pseudomonas aeruginosa NOT DETECTED NOT DETECTED   Candida albicans NOT DETECTED NOT DETECTED   Candida glabrata NOT DETECTED NOT DETECTED   Candida krusei NOT DETECTED NOT DETECTED   Candida parapsilosis NOT DETECTED NOT DETECTED   Candida tropicalis NOT DETECTED NOT DETECTED  Culture, blood (routine x 2)     Status: None (Preliminary result)   Collection Time: 05/18/2017  3:26 PM  Result Value Ref Range   Specimen Description BLOOD RT ARM    Special Requests      BOTTLES DRAWN AEROBIC AND ANAEROBIC Blood Culture results may not be optimal due to  an inadequate volume of blood received in culture bottles   Culture NO GROWTH < 24 HOURS    Report Status PENDING   Troponin I     Status: Abnormal   Collection Time: 05/19/2017  4:31 PM  Result Value Ref Range   Troponin I 0.18 (HH) <0.03 ng/mL    Comment: CRITICAL RESULT CALLED TO, READ BACK BY AND VERIFIED WITH ASHLEY RAMIREZ ON 05/19/2017 AT 1743 [facility]   Urinalysis, Complete w Microscopic     Status: Abnormal   Collection Time: 05/03/2017  4:57 PM  Result Value Ref Range   Color, Urine AMBER (A) YELLOW    Comment:  BIOCHEMICALS MAY BE AFFECTED BY COLOR   APPearance CLOUDY (A) CLEAR   Specific Gravity, Urine 1.029 1.005 - 1.030   pH 5.0 5.0 - 8.0   Glucose, UA NEGATIVE NEGATIVE mg/dL   Hgb urine dipstick NEGATIVE NEGATIVE   Bilirubin Urine NEGATIVE NEGATIVE   Ketones, ur NEGATIVE NEGATIVE mg/dL   Protein, ur 30 (A) NEGATIVE mg/dL   Nitrite NEGATIVE NEGATIVE   Leukocytes, UA TRACE (A) NEGATIVE   RBC / HPF 0-5 0 - 5 RBC/hpf   WBC, UA 6-30 0 - 5 WBC/hpf   Bacteria, UA NONE SEEN NONE SEEN   Squamous Epithelial / LPF 0-5 (A) NONE SEEN   Mucus PRESENT    Hyaline Casts, UA PRESENT   Urine culture     Status: None   Collection Time: 05/03/2017  4:57 PM  Result Value Ref Range   Specimen Description URINE, CLEAN CATCH    Special Requests NONE    Culture      NO GROWTH Performed at [facility], [address] [place], [place] [address]    Report Status 05/18/2017 FINAL   Strep pneumoniae urinary antigen     Status: None   Collection Time: 05/08/2017  4:57 PM  Result Value Ref Range   Strep Pneumo Urinary Antigen NEGATIVE NEGATIVE    Comment:        Infection due to S. pneumoniae cannot be absolutely ruled out since the antigen present may be below the detection limit of the test. Performed at [facility], [place] [address] [place], [place] [address]   Lactic acid, plasma     Status: Abnormal   Collection Time: 05/20/2017  5:10 PM  Result Value Ref Range   Lactic Acid, Venous 2.0 (HH) 0.5 - 1.9 mmol/L    Comment: CRITICAL RESULT CALLED TO, READ BACK BY AND VERIFIED WITH ASHLEY RAMIREZ AT 1804 05/15/2017. JJB  Culture, blood (routine x 2) Call MD if unable to obtain prior to antibiotics being given     Status: None (Preliminary result)   Collection Time: 05/18/2017  5:54 PM  Result Value Ref Range   Specimen Description BLOOD LT WRIST    Special Requests      BOTTLES DRAWN AEROBIC AND ANAEROBIC Blood Culture adequate volume   Culture NO GROWTH < 12 HOURS    Report Status PENDING   MRSA  PCR Screening     Status: None   Collection Time: 05/04/2017  6:35 PM  Result Value Ref Range   MRSA by PCR NEGATIVE NEGATIVE    Comment:        The GeneXpert MRSA Assay (FDA approved for NASAL specimens only), is one component of a comprehensive MRSA colonization surveillance program. It is not intended to diagnose MRSA infection nor to guide or monitor treatment for MRSA infections.   Culture, blood (routine  x 2) Call MD if unable to obtain prior to antibiotics being given     Status: None (Preliminary result)   Collection Time: 05/07/2017  7:10 PM  Result Value Ref Range   Specimen Description BLOOD RIGHT WRIST    Special Requests      BOTTLES DRAWN AEROBIC AND ANAEROBIC Blood Culture results may not be optimal due to an excessive volume of blood received in culture bottles   Culture NO GROWTH < 12 HOURS    Report Status PENDING   HIV antibody (Routine Testing)     Status: None   Collection Time: 05/15/2017  7:22 PM  Result Value Ref Range   HIV Screen 4th Generation wRfx Non Reactive Non Reactive    Comment: (NOTE) Performed At: [facility] [place], [place] [id number] Lindon Romp MD [id number]   Troponin I (q 6hr x 3)     Status: Abnormal   Collection Time: 05/03/2017  7:22 PM  Result Value Ref Range   Troponin I 0.03 (HH) <0.03 ng/mL    Comment: CRITICAL VALUE NOTED. VALUE IS CONSISTENT WITH PREVIOUSLY REPORTED/CALLED VALUE.PMH  Troponin I (q 6hr x 3)     Status: Abnormal   Collection Time: 05/17/17 12:42 AM  Result Value Ref Range   Troponin I 0.03 (HH) <0.03 ng/mL    Comment: CRITICAL VALUE NOTED. VALUE IS CONSISTENT WITH PREVIOUSLY REPORTED/CALLED VALUE RWW   Lactic acid, plasma     Status: Abnormal   Collection Time: 05/17/17  3:07 AM  Result Value Ref Range   Lactic Acid, Venous 2.4 (HH) 0.5 - 1.9 mmol/L    Comment: CRITICAL RESULT CALLED TO, READ BACK BY AND VERIFIED WITH DOROTHY RIGUERA AT 3149 05/17/17.PMH  CBC with  Differential/Platelet     Status: Abnormal   Collection Time: 05/18/17 10:06 AM  Result Value Ref Range   WBC 17.8 (H) 3.6 - 11.0 K/uL   RBC 4.43 3.80 - 5.20 MIL/uL   Hemoglobin 12.7 12.0 - 16.0 g/dL   HCT 38.9 35.0 - 47.0 %   MCV 87.8 80.0 - 100.0 fL   MCH 28.7 26.0 - 34.0 pg   MCHC 32.7 32.0 - 36.0 g/dL   RDW 15.5 (H) 11.5 - 14.5 %   Platelets 79 (L) 150 - 440 K/uL   Neutrophils Relative % 90 %   Neutro Abs 16.0 (H) 1.4 - 6.5 K/uL   Lymphocytes Relative 6 %   Lymphs Abs 1.0 1.0 - 3.6 K/uL   Monocytes Relative 4 %   Monocytes Absolute 0.8 0.2 - 0.9 K/uL   Eosinophils Relative 0 %   Eosinophils Absolute 0.0 0 - 0.7 K/uL   Basophils Relative 0 %   Basophils Absolute 0.1 0 - 0.1 K/uL  Basic metabolic panel     Status: Abnormal   Collection Time: 05/18/17 10:06 AM  Result Value Ref Range   Sodium 146 (H) 135 - 145 mmol/L   Potassium 3.4 (L) 3.5 - 5.1 mmol/L   Chloride 115 (H) 101 - 111 mmol/L   CO2 23 22 - 32 mmol/L   Glucose, Bld 128 (H) 65 - 99 mg/dL   BUN 18 6 - 20 mg/dL   Creatinine, Ser 0.73 0.44 - 1.00 mg/dL   Calcium 8.1 (L) 8.9 - 10.3 mg/dL   GFR calc non Af Amer >60 >60 mL/min   GFR calc Af Amer >60 >60 mL/min    Comment: (NOTE) The eGFR has been calculated using the CKD EPI equation. This calculation  has not been validated in all clinical situations. eGFR's persistently <60 mL/min signify possible Chronic Kidney Disease.    Anion gap 8 5 - 15   No components found for: ESR, C REACTIVE PROTEIN MICRO: Recent Results (from the past 720 hour(s))  Rapid strep screen     Status: None   Collection Time: 05/14/17  5:40 PM  Result Value Ref Range Status   Streptococcus, Group A Screen (Direct) NEGATIVE NEGATIVE Final    Comment: (NOTE) A Rapid Antigen test may result negative if the antigen level in the sample is below the detection level of this test. The FDA has not cleared this test as a stand-alone test therefore the rapid antigen negative result has reflexed  to a Group A Strep culture.   Culture, group A strep     Status: None   Collection Time: 05/14/17  5:40 PM  Result Value Ref Range Status   Specimen Description THROAT  Final   Special Requests NONE Reflexed from [id number]  Final   Culture   Final    NO GROUP A STREP (S.PYOGENES) ISOLATED Performed at [facility], [address], [place], [place] [address]    Report Status 05/17/2017 FINAL  Final  Culture, blood (routine x 2)     Status: None (Preliminary result)   Collection Time: 05/05/2017  1:58 PM  Result Value Ref Range Status   Specimen Description BLOOD LT ARM  Final   Special Requests   Final    BOTTLES DRAWN AEROBIC AND ANAEROBIC Blood Culture results may not be optimal due to an inadequate volume of blood received in culture bottles   Culture  Setup Time   Final    GRAM POSITIVE COCCI ANAEROBIC BOTTLE ONLY CRITICAL RESULT CALLED TO, READ BACK BY AND VERIFIED WITH: LISA KLUTTZ AT 1025 05/17/17 SDR    Culture   Final    GRAM POSITIVE COCCI CULTURE REINCUBATED FOR BETTER GROWTH Performed at [facility], [place] [address] [place], [place] [address]    Report Status PENDING  Incomplete  Blood Culture ID Panel (Reflexed)     Status: Abnormal   Collection Time: 04/27/2017  1:58 PM  Result Value Ref Range Status   Enterococcus species NOT DETECTED NOT DETECTED Final   Listeria monocytogenes NOT DETECTED NOT DETECTED Final   Staphylococcus species DETECTED (A) NOT DETECTED Final    Comment: Methicillin (oxacillin) resistant coagulase negative staphylococcus. Possible blood culture contaminant (unless isolated from more than one blood culture draw or clinical case suggests pathogenicity). No antibiotic treatment is indicated for blood  culture contaminants. CRITICAL RESULT CALLED TO, READ BACK BY AND VERIFIED WITH: LISA KLUTZ ON 05/17/17 AT 1025 SDR    Staphylococcus aureus NOT DETECTED NOT DETECTED Final   Methicillin resistance DETECTED (A) NOT DETECTED Final     Comment: CRITICAL RESULT CALLED TO, READ BACK BY AND VERIFIED WITH: LISA KLUTZ ON 05/17/17 AT 1025 SDR    Streptococcus species NOT DETECTED NOT DETECTED Final   Streptococcus agalactiae NOT DETECTED NOT DETECTED Final   Streptococcus pneumoniae NOT DETECTED NOT DETECTED Final   Streptococcus pyogenes NOT DETECTED NOT DETECTED Final   Acinetobacter baumannii NOT DETECTED NOT DETECTED Final   Enterobacteriaceae species NOT DETECTED NOT DETECTED Final   Enterobacter cloacae complex NOT DETECTED NOT DETECTED Final   Escherichia coli NOT DETECTED NOT DETECTED Final   Klebsiella oxytoca NOT DETECTED NOT DETECTED Final   Klebsiella pneumoniae NOT DETECTED NOT DETECTED Final   Proteus species NOT  DETECTED NOT DETECTED Final   Serratia marcescens NOT DETECTED NOT DETECTED Final   Haemophilus influenzae NOT DETECTED NOT DETECTED Final   Neisseria meningitidis NOT DETECTED NOT DETECTED Final   Pseudomonas aeruginosa NOT DETECTED NOT DETECTED Final   Candida albicans NOT DETECTED NOT DETECTED Final   Candida glabrata NOT DETECTED NOT DETECTED Final   Candida krusei NOT DETECTED NOT DETECTED Final   Candida parapsilosis NOT DETECTED NOT DETECTED Final   Candida tropicalis NOT DETECTED NOT DETECTED Final  Culture, blood (routine x 2)     Status: None (Preliminary result)   Collection Time: 05/01/2017  3:26 PM  Result Value Ref Range Status   Specimen Description BLOOD RT ARM  Final   Special Requests   Final    BOTTLES DRAWN AEROBIC AND ANAEROBIC Blood Culture results may not be optimal due to an inadequate volume of blood received in culture bottles   Culture NO GROWTH < 24 HOURS  Final   Report Status PENDING  Incomplete  Urine culture     Status: None   Collection Time: 04/27/2017  4:57 PM  Result Value Ref Range Status   Specimen Description URINE, CLEAN CATCH  Final   Special Requests NONE  Final   Culture   Final    NO GROWTH Performed at [facility], [address],  [place], [place] [address]    Report Status 05/18/2017 FINAL  Final  Culture, blood (routine x 2) Call MD if unable to obtain prior to antibiotics being given     Status: None (Preliminary result)   Collection Time: 05/19/2017  5:54 PM  Result Value Ref Range Status   Specimen Description BLOOD LT WRIST  Final   Special Requests   Final    BOTTLES DRAWN AEROBIC AND ANAEROBIC Blood Culture adequate volume   Culture NO GROWTH < 12 HOURS  Final   Report Status PENDING  Incomplete  MRSA PCR Screening     Status: None   Collection Time: 05/04/2017  6:35 PM  Result Value Ref Range Status   MRSA by PCR NEGATIVE NEGATIVE Final    Comment:        The GeneXpert MRSA Assay (FDA approved for NASAL specimens only), is one component of a comprehensive MRSA colonization surveillance program. It is not intended to diagnose MRSA infection nor to guide or monitor treatment for MRSA infections.   Culture, blood (routine x 2) Call MD if unable to obtain prior to antibiotics being given     Status: None (Preliminary result)   Collection Time: 04/25/2017  7:10 PM  Result Value Ref Range Status   Specimen Description BLOOD RIGHT WRIST  Final   Special Requests   Final    BOTTLES DRAWN AEROBIC AND ANAEROBIC Blood Culture results may not be optimal due to an excessive volume of blood received in culture bottles   Culture NO GROWTH < 12 HOURS  Final   Report Status PENDING  Incomplete    IMAGING: Dg Chest 2 View  Result Date: 05/05/2017 CLINICAL DATA:  Cough and lethargy EXAM: CHEST  2 VIEW COMPARISON:  01/12/2017 FINDINGS: Cardiac shadow is within normal limits. The lungs are well aerated bilaterally. Patchy infiltrative changes are seen in the left mid and lower lung as well as the right lung base. No sizable effusion is seen. No bony abnormality is noted. Ventricular shunt is noted on the right. IMPRESSION: Bilateral infiltrates as described. Electronically Signed   By: Inez Catalina M.D.   On:  05/06/2017 12:37    Ct Angio Chest Pe W Or Wo Contrast  Result Date: 05/18/2017 CLINICAL DATA:  Shortness of breath. EXAM: CT ANGIOGRAPHY CHEST WITH CONTRAST TECHNIQUE: Multidetector CT imaging of the chest was performed using the standard protocol during bolus administration of intravenous contrast. Multiplanar CT image reconstructions and MIPs were obtained to evaluate the vascular anatomy. CONTRAST:  75 mL of Isovue 370 intravenously. COMPARISON:  Radiographs of May 17, 2017. FINDINGS: Cardiovascular: Satisfactory opacification of the pulmonary arteries to the segmental level. No evidence of pulmonary embolism. Normal heart size. No pericardial effusion. Mediastinum/Nodes: No enlarged mediastinal, hilar, or axillary lymph nodes. Thyroid gland, trachea, and esophagus demonstrate no significant findings. Lungs/Pleura: No pneumothorax is noted. Large left upper and lower lobe airspace opacities are noted most consistent with pneumonia. Smaller right upper and lower lobe opacities are also noted most consistent with pneumonia. Mild bilateral pleural effusions are noted. Upper Abdomen: No acute abnormality. Musculoskeletal: No chest wall abnormality. No acute or significant osseous findings. Review of the MIP images confirms the above findings. IMPRESSION: No definite evidence of pulmonary embolus. Bilateral upper and lower lobe airspace opacities are noted most consistent with pneumonia. Mild bilateral pleural effusions are noted. Electronically Signed   By: Marijo Conception, M.D.   On: 05/18/2017 12:10   Dg Chest Port 1 View  Result Date: 05/17/2017 CLINICAL DATA:  Cough EXAM: PORTABLE CHEST 1 VIEW COMPARISON:  Chest radiograph 04/27/2017 at 12:07 p.m. FINDINGS: Marked worsening of consolidative opacity in the left mid lung compared to the earlier study. Aeration at the right lung base is improved. No pneumothorax or pleural effusion. IMPRESSION: Marked worsening of left mid lung consolidation, likely infection or  aspiration. Electronically Signed   By: Ulyses Jarred M.D.   On: 05/17/2017 02:18    Assessment:   Lindsey Allison is a 41 y.o. female with multifocal PNA on CT chest. Possible aspiration.  CT shows severe dense infiltrate.   Recommendations Check Resp PCR Cont vanco and zosyn Add azithro Check sputum cx if can produce.  May  Need bronch if worsens Thank you very much for allowing me to participate in the care of this patient. Please call with questions.   Cheral Marker. Ola Spurr, MD

## 2017-05-18 NOTE — Progress Notes (Signed)
[employer] -  at [facility]                                                                                                                                                                                  Patient Demographics   Lindsey Allison, is a 41 y.o. female, DOB - 1976/04/29, YQM:[id number]  Admit date - 05/15/2017   Admitting Physician Ramonita Lab, MD  Outpatient Primary MD for the patient is Burns, Shellia Cleverly, MD   LOS - 2  Subjective: Called by nurse due to patient having worsening respiratory distress oxygen sats not staying up while being on 6 L of oxygen, blood pressure also dropping  Review of Systems:   CONSTITUTIONAL:limited Due to patient's illness  Vitals:   Vitals:   05/18/17 1503 05/18/17 1504 05/18/17 1509 05/18/17 1511  BP: (!) 85/56 (!) 85/56 (!) 77/57 (!) 77/57  Pulse: (!) 101  85   Resp:      Temp:      TempSrc:      SpO2: 90% 90%    Weight:      Height:        Wt Readings from Last 3 Encounters:  05/17/17 161 lb (73 kg)  04/04/17 142 lb (64.4 kg)  01/12/17 145 lb (65.8 kg)     Intake/Output Summary (Last 24 hours) at 05/18/17 1519 Last data filed at 05/17/17 2355  Gross per 24 hour  Intake          1541.67 ml  Output               50 ml  Net          1491.67 ml    Physical Exam:   GENERAL:ritically ill-appearing HEAD, EYES, EARS, NOSE AND THROAT: Bilateral conjunctival erythema lip swelling NECK: Supple. There is no jugular venous distention. No bruits, no lymphadenopathy, no thyromegaly.  HEART: Regular rate and rhythm,. No murmurs, no rubs, no clicks.  LUNGS: Rhonchorous breath sounds bilaterally. No rales or rhonchi. No wheezes. + assesory muscle usage ABDOMEN: Soft, flat, nontender, nondistended. Has good bowel sounds. No hepatosplenomegaly appreciated.  EXTREMITIES: No evidence of any cyanosis, clubbing, or peripheral edema.  +2 pedal and radial pulses bilaterally.  NEUROLOGIC: The patient is little  sleepy,  with no focal motor or sensory deficits appreciated bilaterally.  SKIN: Erythematous rash all over her chest And abdomen Psych: Not anxious, depressed LN: No inguinal LN enlargement    Antibiotics   Anti-infectives    Start     Dose/Rate Route Frequency Ordered Stop   05/18/17 0900  piperacillin-tazobactam (ZOSYN) IVPB 3.375 g     3.375 g 12.5 mL/hr over 240 Minutes  Intravenous Every 8 hours 05/18/17 0851     05/17/17 0200  vancomycin (VANCOCIN) IVPB 750 mg/150 ml premix  Status:  Discontinued     750 mg 150 mL/hr over 60 Minutes Intravenous Every 8 hours 05-19-2017 1857 05/19/2017 1902   May 19, 2017 2100  ceFEPIme (MAXIPIME) 2 g in dextrose 5 % 50 mL IVPB  Status:  Discontinued     2 g 100 mL/hr over 30 Minutes Intravenous Every 8 hours 05-19-17 1740 05/18/17 0851   19-May-2017 2100  vancomycin (VANCOCIN) IVPB 750 mg/150 ml premix  Status:  Discontinued     750 mg 150 mL/hr over 60 Minutes Intravenous Every 8 hours 19-May-2017 1902 05/18/17 0851   May 19, 2017 2000  vancomycin (VANCOCIN) IVPB 750 mg/150 ml premix  Status:  Discontinued     750 mg 150 mL/hr over 60 Minutes Intravenous  Once 05/19/17 1856 19-May-2017 1859   2017/05/19 1416  piperacillin-tazobactam (ZOSYN) 3-0.375 GM/50ML IVPB    Comments:  Hatch, Shannin   : cabinet override      2017-05-19 1416 05/17/17 0229   19-May-2017 1400  vancomycin (VANCOCIN) IVPB 1000 mg/200 mL premix     1,000 mg 200 mL/hr over 60 Minutes Intravenous  Once 2017-05-19 1350 May 19, 2017 1643   05-19-2017 1345  vancomycin (VANCOCIN) injection 1 g  Status:  Discontinued     1 g Intravenous  Once May 19, 2017 1345 05/19/17 1350   2017-05-19 1345  piperacillin-tazobactam (ZOSYN) IVPB 3.375 g     3.375 g 100 mL/hr over 30 Minutes Intravenous  Once 05-19-2017 1345 05-19-17 1504      Medications   Scheduled Meds: . chlorhexidine  15 mL Mouth/Throat BID  . erythromycin   Both Eyes Q6H  . FLUoxetine  20 mg Oral Daily  . loratadine  10 mg Oral Daily  . methylPREDNISolone  (SOLU-MEDROL) injection  60 mg Intravenous Q12H  . pantoprazole  40 mg Oral Daily  . pneumococcal 23 valent vaccine  0.5 mL Intramuscular Tomorrow-1000   Continuous Infusions: . piperacillin-tazobactam (ZOSYN)  IV 3.375 g (05/18/17 1422)  . sodium chloride 1,000 mL (05/18/17 1512)   PRN Meds:.acetaminophen, guaiFENesin-dextromethorphan, ibuprofen, lidocaine, LORazepam, ondansetron (ZOFRAN) IV, sodium chloride   Data Review:   Micro Results Recent Results (from the past 240 hour(s))  Rapid strep screen     Status: None   Collection Time: 05/14/17  5:40 PM  Result Value Ref Range Status   Streptococcus, Group A Screen (Direct) NEGATIVE NEGATIVE Final    Comment: (NOTE) A Rapid Antigen test may result negative if the antigen level in the sample is below the detection level of this test. The FDA has not cleared this test as a stand-alone test therefore the rapid antigen negative result has reflexed to a Group A Strep culture.   Culture, group A strep     Status: None   Collection Time: 05/14/17  5:40 PM  Result Value Ref Range Status   Specimen Description THROAT  Final   Special Requests NONE Reflexed from [id number]  Final   Culture   Final    NO GROUP A STREP (S.PYOGENES) ISOLATED Performed at [facility], [address] [place], [place] [address]    Report Status 05/17/2017 FINAL  Final  Culture, blood (routine x 2)     Status: None (Preliminary result)   Collection Time: 05/19/17  1:58 PM  Result Value Ref Range Status   Specimen Description BLOOD LT ARM  Final   Special Requests   Final  BOTTLES DRAWN AEROBIC AND ANAEROBIC Blood Culture results may not be optimal due to an inadequate volume of blood received in culture bottles   Culture  Setup Time   Final    GRAM POSITIVE COCCI ANAEROBIC BOTTLE ONLY CRITICAL RESULT CALLED TO, READ BACK BY AND VERIFIED WITH: LISA KLUTTZ AT 1025 05/17/17 SDR    Culture   Final    GRAM POSITIVE COCCI CULTURE REINCUBATED FOR BETTER  GROWTH Performed at [facility], [address], [place], [place] [address]    Report Status PENDING  Incomplete  Blood Culture ID Panel (Reflexed)     Status: Abnormal   Collection Time: 05/19/2017  1:58 PM  Result Value Ref Range Status   Enterococcus species NOT DETECTED NOT DETECTED Final   Listeria monocytogenes NOT DETECTED NOT DETECTED Final   Staphylococcus species DETECTED (A) NOT DETECTED Final    Comment: Methicillin (oxacillin) resistant coagulase negative staphylococcus. Possible blood culture contaminant (unless isolated from more than one blood culture draw or clinical case suggests pathogenicity). No antibiotic treatment is indicated for blood  culture contaminants. CRITICAL RESULT CALLED TO, READ BACK BY AND VERIFIED WITH: LISA KLUTZ ON 05/17/17 AT 1025 SDR    Staphylococcus aureus NOT DETECTED NOT DETECTED Final   Methicillin resistance DETECTED (A) NOT DETECTED Final    Comment: CRITICAL RESULT CALLED TO, READ BACK BY AND VERIFIED WITH: LISA KLUTZ ON 05/17/17 AT 1025 SDR    Streptococcus species NOT DETECTED NOT DETECTED Final   Streptococcus agalactiae NOT DETECTED NOT DETECTED Final   Streptococcus pneumoniae NOT DETECTED NOT DETECTED Final   Streptococcus pyogenes NOT DETECTED NOT DETECTED Final   Acinetobacter baumannii NOT DETECTED NOT DETECTED Final   Enterobacteriaceae species NOT DETECTED NOT DETECTED Final   Enterobacter cloacae complex NOT DETECTED NOT DETECTED Final   Escherichia coli NOT DETECTED NOT DETECTED Final   Klebsiella oxytoca NOT DETECTED NOT DETECTED Final   Klebsiella pneumoniae NOT DETECTED NOT DETECTED Final   Proteus species NOT DETECTED NOT DETECTED Final   Serratia marcescens NOT DETECTED NOT DETECTED Final   Haemophilus influenzae NOT DETECTED NOT DETECTED Final   Neisseria meningitidis NOT DETECTED NOT DETECTED Final   Pseudomonas aeruginosa NOT DETECTED NOT DETECTED Final   Candida albicans NOT DETECTED NOT DETECTED Final    Candida glabrata NOT DETECTED NOT DETECTED Final   Candida krusei NOT DETECTED NOT DETECTED Final   Candida parapsilosis NOT DETECTED NOT DETECTED Final   Candida tropicalis NOT DETECTED NOT DETECTED Final  Culture, blood (routine x 2)     Status: None (Preliminary result)   Collection Time: 04/30/2017  3:26 PM  Result Value Ref Range Status   Specimen Description BLOOD RT ARM  Final   Special Requests   Final    BOTTLES DRAWN AEROBIC AND ANAEROBIC Blood Culture results may not be optimal due to an inadequate volume of blood received in culture bottles   Culture NO GROWTH 2 DAYS  Final   Report Status PENDING  Incomplete  Urine culture     Status: None   Collection Time: 05/01/2017  4:57 PM  Result Value Ref Range Status   Specimen Description URINE, CLEAN CATCH  Final   Special Requests NONE  Final   Culture   Final    NO GROWTH Performed at [facility], [address], [place], [place] [address]    Report Status 05/18/2017 FINAL  Final  Culture, blood (routine x 2) Call MD if unable to obtain prior to  antibiotics being given     Status: None (Preliminary result)   Collection Time: 05/17/2017  5:54 PM  Result Value Ref Range Status   Specimen Description BLOOD LT WRIST  Final   Special Requests   Final    BOTTLES DRAWN AEROBIC AND ANAEROBIC Blood Culture adequate volume   Culture NO GROWTH 2 DAYS  Final   Report Status PENDING  Incomplete  MRSA PCR Screening     Status: None   Collection Time: 05/19/2017  6:35 PM  Result Value Ref Range Status   MRSA by PCR NEGATIVE NEGATIVE Final    Comment:        The GeneXpert MRSA Assay (FDA approved for NASAL specimens only), is one component of a comprehensive MRSA colonization surveillance program. It is not intended to diagnose MRSA infection nor to guide or monitor treatment for MRSA infections.   Culture, blood (routine x 2) Call MD if unable to obtain prior to antibiotics being given     Status: None (Preliminary result)    Collection Time: 05/11/2017  7:10 PM  Result Value Ref Range Status   Specimen Description BLOOD RIGHT WRIST  Final   Special Requests   Final    BOTTLES DRAWN AEROBIC AND ANAEROBIC Blood Culture results may not be optimal due to an excessive volume of blood received in culture bottles   Culture NO GROWTH 2 DAYS  Final   Report Status PENDING  Incomplete    Radiology Reports Dg Chest 2 View  Result Date: 05/04/2017 CLINICAL DATA:  Cough and lethargy EXAM: CHEST  2 VIEW COMPARISON:  01/12/2017 FINDINGS: Cardiac shadow is within normal limits. The lungs are well aerated bilaterally. Patchy infiltrative changes are seen in the left mid and lower lung as well as the right lung base. No sizable effusion is seen. No bony abnormality is noted. Ventricular shunt is noted on the right. IMPRESSION: Bilateral infiltrates as described. Electronically Signed   By: Alcide Clever M.D.   On: 05/13/2017 12:37   Ct Angio Chest Pe W Or Wo Contrast  Result Date: 05/18/2017 CLINICAL DATA:  Shortness of breath. EXAM: CT ANGIOGRAPHY CHEST WITH CONTRAST TECHNIQUE: Multidetector CT imaging of the chest was performed using the standard protocol during bolus administration of intravenous contrast. Multiplanar CT image reconstructions and MIPs were obtained to evaluate the vascular anatomy. CONTRAST:  75 mL of Isovue 370 intravenously. COMPARISON:  Radiographs of May 17, 2017. FINDINGS: Cardiovascular: Satisfactory opacification of the pulmonary arteries to the segmental level. No evidence of pulmonary embolism. Normal heart size. No pericardial effusion. Mediastinum/Nodes: No enlarged mediastinal, hilar, or axillary lymph nodes. Thyroid gland, trachea, and esophagus demonstrate no significant findings. Lungs/Pleura: No pneumothorax is noted. Large left upper and lower lobe airspace opacities are noted most consistent with pneumonia. Smaller right upper and lower lobe opacities are also noted most consistent with pneumonia.  Mild bilateral pleural effusions are noted. Upper Abdomen: No acute abnormality. Musculoskeletal: No chest wall abnormality. No acute or significant osseous findings. Review of the MIP images confirms the above findings. IMPRESSION: No definite evidence of pulmonary embolus. Bilateral upper and lower lobe airspace opacities are noted most consistent with pneumonia. Mild bilateral pleural effusions are noted. Electronically Signed   By: Lupita Raider, M.D.   On: 05/18/2017 12:10   Dg Chest Port 1 View  Result Date: 05/17/2017 CLINICAL DATA:  Cough EXAM: PORTABLE CHEST 1 VIEW COMPARISON:  Chest radiograph 05/17/2017 at 12:07 p.m. FINDINGS: Marked worsening of consolidative opacity in the left  mid lung compared to the earlier study. Aeration at the right lung base is improved. No pneumothorax or pleural effusion. IMPRESSION: Marked worsening of left mid lung consolidation, likely infection or aspiration. Electronically Signed   By: Deatra Robinson M.D.   On: 05/17/2017 02:18     CBC  Recent Labs Lab 05/14/17 1745 05/17/17 1311 05/18/17 1006  WBC 14.0* 14.4* 17.8*  HGB 13.3 13.5 12.7  HCT 40.2 41.2 38.9  PLT 121* 98* 79*  MCV 88.7 88.1 87.8  MCH 29.4 28.8 28.7  MCHC 33.1 32.7 32.7  RDW 14.9* 15.3* 15.5*  LYMPHSABS 1.0 1.1 1.0  MONOABS 0.7 0.4 0.8  EOSABS 0.2 0.1 0.0  BASOSABS 0.1 0.0 0.1    Chemistries   Recent Labs Lab 2017/05/17 1311 05/18/17 1006  NA 138 146*  K 4.0 3.4*  CL 102 115*  CO2 25 23  GLUCOSE 97 128*  BUN 16 18  CREATININE 0.72 0.73  CALCIUM 8.6* 8.1*  AST 58*  --   ALT 61*  --   ALKPHOS 124  --   BILITOT 2.0*  --    ------------------------------------------------------------------------------------------------------------------ estimated creatinine clearance is 93.7 mL/min (by C-G formula based on SCr of 0.73 mg/dL). ------------------------------------------------------------------------------------------------------------------ No results for input(s):  HGBA1C in the last 72 hours. ------------------------------------------------------------------------------------------------------------------ No results for input(s): CHOL, HDL, LDLCALC, TRIG, CHOLHDL, LDLDIRECT in the last 72 hours. ------------------------------------------------------------------------------------------------------------------ No results for input(s): TSH, T4TOTAL, T3FREE, THYROIDAB in the last 72 hours.  Invalid input(s): FREET3 ------------------------------------------------------------------------------------------------------------------ No results for input(s): VITAMINB12, FOLATE, FERRITIN, TIBC, IRON, RETICCTPCT in the last 72 hours.  Coagulation profile No results for input(s): INR, PROTIME in the last 168 hours.  No results for input(s): DDIMER in the last 72 hours.  Cardiac Enzymes  Recent Labs Lab 05/17/17 1631 05-17-2017 1922 05/17/17 0042  TROPONINI 0.18* 0.03* 0.03*   ------------------------------------------------------------------------------------------------------------------ Invalid input(s): POCBNP    Assessment & Plan  Lindsey Allison  is a 41 y.o. female with a known history of myoclonic dystrophy, anxiety, GERD,hydrocephalus,poor historian, is brought into the ED by her care giver as patient is not feeling well Patient was seen at urgent care on Friday, throat cultures were obtained and patient was given a prescription for azithromycin for possible strep throat,. Both conjunctiva are red and patient is brought into the  Emergency department.Chest x-ray has revealed bilateral infiltrates  #Acute respiratory failure Stat ABG reveals her to be very hypoxic Will increase her oxygen to 100% I have discussed with the intensivist about transfer the patient to the ICU Dr. 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       Patient will be transferred to theICU   Consults   none   DVT Prophylaxis  Lovenox    Lab Results  Component Value Date   PLT 79 (L) 05/18/2017     Time Spent in minutes  Additional 45 minutesminutes of critical care time spent Greater than 50% of time spent in care coordination and counseling patient regarding the condition and plan of care.   Auburn Bilberry M.D on 05/18/2017 at 3:19 PM  Between 7am to 6pm - Pager - [phone]  After 6pm go to [email or web address] - password [email or web address] [facility]  [facility]   Office  [phone]

## 2017-05-18 NOTE — Progress Notes (Signed)
On arrival to unit from 1C patient oxygen saturations 85% on non rebreather. RRT attempted to place patient on HFNC, but patient unable to tolerated. Dr. Belia Heman assessed patient and patient was intubated, central line, and foley placed. Patient stable at this time. Will continue to assess. Per Dr. Belia Heman give patient 1L IVF bolus. Bolus infusing. Trudee Kuster

## 2017-05-18 NOTE — Progress Notes (Signed)
  Speech Language Pathology Treatment: Dysphagia  Patient Details Name: Lindsey Allison MRN: [id number] DOB: 07/20/1976 Today's Date: 05/18/2017 Time: 4098-1191 SLP Time Calculation (min) (ACUTE ONLY): 42 min  Assessment / Plan / Recommendation Clinical Impression  Pt seen for toleration of diet and overall status today. Reviewed chart notes; labs. Pt continues to feel poorly per her report. MD is having a CT of chest completed d/t concern for pneumonia. Pt tolerated po diet w/ NSG yesterday w/ no reports of overt s/s of aspiration.  However, d/t pt's presentation of declined medical status overall at this time, SLP consulted MD re: downgrading of diet for precaution - MD agreed. Pt was given trials of Nectar liquids and purees which she consumed w/ no overt s/s of aspiration; no change in vocal quality or respiratory effort noted during/post po trials. Pt only accepted a few trials; NSG to give pills crushed in puree at this time as well. Pt assisted w/ feeding to conserve energy.  Recommend modifying dysphagia diet to be conservative while pt's medical status is declined; strict aspiration precautions; pills in puree. ST services will f/u w/ pt's status, monitoring labs, and perform ongoing assessment and trials to upgrade diet again when appropriate. NSG updated.     HPI HPI: Pt is a 41 y.o. female with a known history of muscular dystrophy, anxiety, GERD, constipation, hydrocephalus(unsure of cognitive status baseline), poor historian, is brought into the ED by her care giver as patient is not feeling well. Patient was seen at urgent care on Friday, throat cultures were obtained and patient was given a prescription for azithromycin for possible strep throat,. Both conjunctiva are red and patient is brought into the  Emergency department. Chest x-ray has revealed bilateral infiltrates left mid lung consolidation, likely infection or aspiration - pt does have Acid Reflux and could be at risk to  aspirate regurgitated food/liquid material w/ and w/out Reflux precautions being followed. Pt is verbal when asked basic questions re: self and follows few commands appropriately.       SLP Plan  Continue with current plan of care       Recommendations  Diet recommendations: Dysphagia 1 (puree);Nectar-thick liquid Liquids provided via: Cup;Straw Medication Administration: Crushed with puree Compensations: Minimize environmental distractions;Slow rate;Small sips/bites;Lingual sweep for clearance of pocketing;Follow solids with liquid Postural Changes and/or Swallow Maneuvers: Seated upright 90 degrees;Upright 30-60 min after meal                General recommendations:  (Dietician f/u) Oral Care Recommendations: Oral care BID;Staff/trained caregiver to provide oral care Follow up Recommendations:  (TBD) SLP Visit Diagnosis: Dysphagia, oral phase (R13.11);Dysphagia, oropharyngeal phase (R13.12) Plan: Continue with current plan of care       GO               Jerilynn Som, MS, CCC-SLP Kaidan Harpster 05/18/2017, 9:47 AM

## 2017-05-18 NOTE — Progress Notes (Signed)
Report given to [facility], CCU RN. Bo Mcclintock, RN

## 2017-05-18 NOTE — Procedures (Signed)
Endotracheal Intubation: Patient required placement of an artificial airway secondary to Respiratory Failure  Consent: Emergent.   Hand washing performed prior to starting the procedure.   Medications administered for sedation prior to procedure:  Midazolam 4 mg IV,  Vecuronium 10 mg IV, Fentanyl 100 mcg IV.    A time out procedure was called and correct patient, name, & ID confirmed. Needed supplies and equipment were assembled and checked to include ETT, 10 ml syringe, Glidescope, Mac and Miller blades, suction, oxygen and bag mask valve, end tidal CO2 monitor.   Patient was positioned to align the mouth and pharynx to facilitate visualization of the glottis.   Heart rate, SpO2 and blood pressure was continuously monitored during the procedure. Pre-oxygenation was conducted prior to intubation and endotracheal tube was placed through the vocal cords into the trachea.     The artificial airway was placed under direct visualization via glidescope route using a 8.0 ETT on the first attempt.  ETT was secured at 23 cm mark.  Placement was confirmed by auscuitation of lungs with good breath sounds bilaterally and no stomach sounds.  Condensation was noted on endotracheal tube.   Pulse ox 98%.  CO2 detector in place with appropriate color change.   Complications: None .   Operator: Nakyiah Kuck.   Chest radiograph ordered and pending.   Comments: OGT placed via glidescope.  Catalena Stanhope David Ulrich Soules, M.D.  [facility]  Medical Director ICU-[facility] [place] Medical Director [facility] Cardio-Pulmonary Department       

## 2017-05-18 NOTE — Progress Notes (Signed)
[employer] - [place] [facility]                                                                                                                                                                                  Patient Demographics   Lindsey Allison, is a 41 y.o. female, DOB - 1976-07-29, ZOX:[id number]  Admit date - 05/21/2017   Admitting Physician Ramonita Lab, MD  Outpatient Primary MD for the patient is Burns, Shellia Cleverly, MD   LOS - 2  Subjective: Patient continues to have respiratory difficulties, on 6 L of oxygen  Review of Systems:   CONSTITUTIONAL:limited Due to patient's illness  Vitals:   Vitals:   05/17/17 1633 05/17/17 1941 05/18/17 0423 05/18/17 0930  BP:  111/64 (!) 104/56   Pulse:  (!) 106 (!) 109   Resp:  20 20   Temp: 98.4 F (36.9 C) 97.8 F (36.6 C) 98 F (36.7 C) (!) 100.8 F (38.2 C)  TempSrc: Oral Oral  Axillary  SpO2:  92% 90%   Weight:      Height:        Wt Readings from Last 3 Encounters:  05/17/17 161 lb (73 kg)  04/04/17 142 lb (64.4 kg)  01/12/17 145 lb (65.8 kg)     Intake/Output Summary (Last 24 hours) at 05/18/17 1047 Last data filed at 05/17/17 2355  Gross per 24 hour  Intake          1741.67 ml  Output               50 ml  Net          1691.67 ml    Physical Exam:   GENERAL:Mild respiratory distress HEAD, EYES, EARS, NOSE AND THROAT: Bilateral conjunctival erythema lip swelling NECK: Supple. There is no jugular venous distention. No bruits, no lymphadenopathy, no thyromegaly.  HEART: Regular rate and rhythm,. No murmurs, no rubs, no clicks.  LUNGS: Rhonchorous breath sounds bilaterally. No rales or rhonchi. No wheezes. + assesory muscle usage ABDOMEN: Soft, flat, nontender, nondistended. Has good bowel sounds. No hepatosplenomegaly appreciated.  EXTREMITIES: No evidence of any cyanosis, clubbing, or peripheral edema.  +2 pedal and radial pulses bilaterally.  NEUROLOGIC: The patient is little sleepy,  with  no focal motor or sensory deficits appreciated bilaterally.  SKIN: Erythematous rash all over her chest And abdomen Psych: Not anxious, depressed LN: No inguinal LN enlargement    Antibiotics   Anti-infectives    Start     Dose/Rate Route Frequency Ordered Stop   05/18/17 0900  piperacillin-tazobactam (ZOSYN) IVPB 3.375 g     3.375 g 12.5 mL/hr over 240 Minutes Intravenous Every 8  hours 05/18/17 0851     05/17/17 0200  vancomycin (VANCOCIN) IVPB 750 mg/150 ml premix  Status:  Discontinued     750 mg 150 mL/hr over 60 Minutes Intravenous Every 8 hours 08-Jun-2017 1857 06/08/17 1902   2017/06/08 2100  ceFEPIme (MAXIPIME) 2 g in dextrose 5 % 50 mL IVPB  Status:  Discontinued     2 g 100 mL/hr over 30 Minutes Intravenous Every 8 hours 2017-06-08 1740 05/18/17 0851   June 08, 2017 2100  vancomycin (VANCOCIN) IVPB 750 mg/150 ml premix  Status:  Discontinued     750 mg 150 mL/hr over 60 Minutes Intravenous Every 8 hours 06/08/2017 1902 05/18/17 0851   06/08/2017 2000  vancomycin (VANCOCIN) IVPB 750 mg/150 ml premix  Status:  Discontinued     750 mg 150 mL/hr over 60 Minutes Intravenous  Once 06/08/17 1856 06/08/17 1859   06-08-2017 1416  piperacillin-tazobactam (ZOSYN) 3-0.375 GM/50ML IVPB    Comments:  Hatch, Shannin   : cabinet override      Jun 08, 2017 1416 05/17/17 0229   Jun 08, 2017 1400  vancomycin (VANCOCIN) IVPB 1000 mg/200 mL premix     1,000 mg 200 mL/hr over 60 Minutes Intravenous  Once 06-08-2017 1350 Jun 08, 2017 1643   06-08-2017 1345  vancomycin (VANCOCIN) injection 1 g  Status:  Discontinued     1 g Intravenous  Once 2017/06/08 1345 06/08/2017 1350   2017/06/08 1345  piperacillin-tazobactam (ZOSYN) IVPB 3.375 g     3.375 g 100 mL/hr over 30 Minutes Intravenous  Once 06-08-2017 1345 08-Jun-2017 1504      Medications   Scheduled Meds: . chlorhexidine  15 mL Mouth/Throat BID  . enoxaparin (LOVENOX) injection  40 mg Subcutaneous Q24H  . erythromycin   Both Eyes Q6H  . FLUoxetine  20 mg Oral Daily  .  loratadine  10 mg Oral Daily  . methylPREDNISolone (SOLU-MEDROL) injection  60 mg Intravenous Q12H  . pantoprazole  40 mg Oral Daily  . pneumococcal 23 valent vaccine  0.5 mL Intramuscular Tomorrow-1000   Continuous Infusions: . piperacillin-tazobactam (ZOSYN)  IV 3.375 g (05/18/17 0934)   PRN Meds:.acetaminophen, guaiFENesin-dextromethorphan, ibuprofen, lidocaine, LORazepam, ondansetron (ZOFRAN) IV   Data Review:   Micro Results Recent Results (from the past 240 hour(s))  Rapid strep screen     Status: None   Collection Time: 05/14/17  5:40 PM  Result Value Ref Range Status   Streptococcus, Group A Screen (Direct) NEGATIVE NEGATIVE Final    Comment: (NOTE) A Rapid Antigen test may result negative if the antigen level in the sample is below the detection level of this test. The FDA has not cleared this test as a stand-alone test therefore the rapid antigen negative result has reflexed to a Group A Strep culture.   Culture, group A strep     Status: None   Collection Time: 05/14/17  5:40 PM  Result Value Ref Range Status   Specimen Description THROAT  Final   Special Requests NONE Reflexed from [id number]  Final   Culture   Final    NO GROUP A STREP (S.PYOGENES) ISOLATED Performed at [facility], [address] [place], [place] [address]    Report Status 05/17/2017 FINAL  Final  Culture, blood (routine x 2)     Status: None (Preliminary result)   Collection Time: 06-08-2017  1:58 PM  Result Value Ref Range Status   Specimen Description BLOOD LT ARM  Final   Special Requests   Final    BOTTLES DRAWN AEROBIC  AND ANAEROBIC Blood Culture results may not be optimal due to an inadequate volume of blood received in culture bottles   Culture  Setup Time   Final    GRAM POSITIVE COCCI ANAEROBIC BOTTLE ONLY CRITICAL RESULT CALLED TO, READ BACK BY AND VERIFIED WITH: LISA KLUTTZ AT 1025 05/17/17 SDR    Culture   Final    GRAM POSITIVE COCCI CULTURE REINCUBATED FOR BETTER  GROWTH Performed at [facility], [address] [place], [place] [address]    Report Status PENDING  Incomplete  Blood Culture ID Panel (Reflexed)     Status: Abnormal   Collection Time: 05-25-2017  1:58 PM  Result Value Ref Range Status   Enterococcus species NOT DETECTED NOT DETECTED Final   Listeria monocytogenes NOT DETECTED NOT DETECTED Final   Staphylococcus species DETECTED (A) NOT DETECTED Final    Comment: Methicillin (oxacillin) resistant coagulase negative staphylococcus. Possible blood culture contaminant (unless isolated from more than one blood culture draw or clinical case suggests pathogenicity). No antibiotic treatment is indicated for blood  culture contaminants. CRITICAL RESULT CALLED TO, READ BACK BY AND VERIFIED WITH: LISA KLUTZ ON 05/17/17 AT 1025 SDR    Staphylococcus aureus NOT DETECTED NOT DETECTED Final   Methicillin resistance DETECTED (A) NOT DETECTED Final    Comment: CRITICAL RESULT CALLED TO, READ BACK BY AND VERIFIED WITH: LISA KLUTZ ON 05/17/17 AT 1025 SDR    Streptococcus species NOT DETECTED NOT DETECTED Final   Streptococcus agalactiae NOT DETECTED NOT DETECTED Final   Streptococcus pneumoniae NOT DETECTED NOT DETECTED Final   Streptococcus pyogenes NOT DETECTED NOT DETECTED Final   Acinetobacter baumannii NOT DETECTED NOT DETECTED Final   Enterobacteriaceae species NOT DETECTED NOT DETECTED Final   Enterobacter cloacae complex NOT DETECTED NOT DETECTED Final   Escherichia coli NOT DETECTED NOT DETECTED Final   Klebsiella oxytoca NOT DETECTED NOT DETECTED Final   Klebsiella pneumoniae NOT DETECTED NOT DETECTED Final   Proteus species NOT DETECTED NOT DETECTED Final   Serratia marcescens NOT DETECTED NOT DETECTED Final   Haemophilus influenzae NOT DETECTED NOT DETECTED Final   Neisseria meningitidis NOT DETECTED NOT DETECTED Final   Pseudomonas aeruginosa NOT DETECTED NOT DETECTED Final   Candida albicans NOT DETECTED NOT DETECTED Final    Candida glabrata NOT DETECTED NOT DETECTED Final   Candida krusei NOT DETECTED NOT DETECTED Final   Candida parapsilosis NOT DETECTED NOT DETECTED Final   Candida tropicalis NOT DETECTED NOT DETECTED Final  Culture, blood (routine x 2)     Status: None (Preliminary result)   Collection Time: 05/25/17  3:26 PM  Result Value Ref Range Status   Specimen Description BLOOD RT ARM  Final   Special Requests   Final    BOTTLES DRAWN AEROBIC AND ANAEROBIC Blood Culture results may not be optimal due to an inadequate volume of blood received in culture bottles   Culture NO GROWTH < 24 HOURS  Final   Report Status PENDING  Incomplete  Urine culture     Status: None   Collection Time: 2017/05/25  4:57 PM  Result Value Ref Range Status   Specimen Description URINE, CLEAN CATCH  Final   Special Requests NONE  Final   Culture   Final    NO GROWTH Performed at [facility], [address], [place], [place] [address]    Report Status 05/18/2017 FINAL  Final  Culture, blood (routine x 2) Call MD if unable to obtain prior to antibiotics being  given     Status: None (Preliminary result)   Collection Time: 04/25/2017  5:54 PM  Result Value Ref Range Status   Specimen Description BLOOD LT WRIST  Final   Special Requests   Final    BOTTLES DRAWN AEROBIC AND ANAEROBIC Blood Culture adequate volume   Culture NO GROWTH < 12 HOURS  Final   Report Status PENDING  Incomplete  MRSA PCR Screening     Status: None   Collection Time: 04/28/2017  6:35 PM  Result Value Ref Range Status   MRSA by PCR NEGATIVE NEGATIVE Final    Comment:        The GeneXpert MRSA Assay (FDA approved for NASAL specimens only), is one component of a comprehensive MRSA colonization surveillance program. It is not intended to diagnose MRSA infection nor to guide or monitor treatment for MRSA infections.   Culture, blood (routine x 2) Call MD if unable to obtain prior to antibiotics being given     Status: None (Preliminary  result)   Collection Time: 05/05/2017  7:10 PM  Result Value Ref Range Status   Specimen Description BLOOD RIGHT WRIST  Final   Special Requests   Final    BOTTLES DRAWN AEROBIC AND ANAEROBIC Blood Culture results may not be optimal due to an excessive volume of blood received in culture bottles   Culture NO GROWTH < 12 HOURS  Final   Report Status PENDING  Incomplete    Radiology Reports Dg Chest 2 View  Result Date: 05/10/2017 CLINICAL DATA:  Cough and lethargy EXAM: CHEST  2 VIEW COMPARISON:  01/12/2017 FINDINGS: Cardiac shadow is within normal limits. The lungs are well aerated bilaterally. Patchy infiltrative changes are seen in the left mid and lower lung as well as the right lung base. No sizable effusion is seen. No bony abnormality is noted. Ventricular shunt is noted on the right. IMPRESSION: Bilateral infiltrates as described. Electronically Signed   By: Alcide Clever M.D.   On: 04/30/2017 12:37   Dg Chest Port 1 View  Result Date: 05/17/2017 CLINICAL DATA:  Cough EXAM: PORTABLE CHEST 1 VIEW COMPARISON:  Chest radiograph 05/09/2017 at 12:07 p.m. FINDINGS: Marked worsening of consolidative opacity in the left mid lung compared to the earlier study. Aeration at the right lung base is improved. No pneumothorax or pleural effusion. IMPRESSION: Marked worsening of left mid lung consolidation, likely infection or aspiration. Electronically Signed   By: Deatra Robinson M.D.   On: 05/17/2017 02:18     CBC  Recent Labs Lab 05/14/17 1745 05/17/2017 1311 05/18/17 1006  WBC 14.0* 14.4* 17.8*  HGB 13.3 13.5 12.7  HCT 40.2 41.2 38.9  PLT 121* 98* 79*  MCV 88.7 88.1 87.8  MCH 29.4 28.8 28.7  MCHC 33.1 32.7 32.7  RDW 14.9* 15.3* 15.5*  LYMPHSABS 1.0 1.1 1.0  MONOABS 0.7 0.4 0.8  EOSABS 0.2 0.1 0.0  BASOSABS 0.1 0.0 0.1    Chemistries   Recent Labs Lab 05/14/2017 1311  NA 138  K 4.0  CL 102  CO2 25  GLUCOSE 97  BUN 16  CREATININE 0.72  CALCIUM 8.6*  AST 58*  ALT 61*  ALKPHOS  124  BILITOT 2.0*   ------------------------------------------------------------------------------------------------------------------ estimated creatinine clearance is 93.7 mL/min (by C-G formula based on SCr of 0.72 mg/dL). ------------------------------------------------------------------------------------------------------------------ No results for input(s): HGBA1C in the last 72 hours. ------------------------------------------------------------------------------------------------------------------ No results for input(s): CHOL, HDL, LDLCALC, TRIG, CHOLHDL, LDLDIRECT in the last 72 hours. ------------------------------------------------------------------------------------------------------------------ No results for input(s): TSH,  T4TOTAL, T3FREE, THYROIDAB in the last 72 hours.  Invalid input(s): FREET3 ------------------------------------------------------------------------------------------------------------------ No results for input(s): VITAMINB12, FOLATE, FERRITIN, TIBC, IRON, RETICCTPCT in the last 72 hours.  Coagulation profile No results for input(s): INR, PROTIME in the last 168 hours.  No results for input(s): DDIMER in the last 72 hours.  Cardiac Enzymes  Recent Labs Lab 12-Jun-2017 1631 06/12/2017 1922 05/17/17 0042  TROPONINI 0.18* 0.03* 0.03*   ------------------------------------------------------------------------------------------------------------------ Invalid input(s): POCBNP    Assessment & Plan  Lindsey Allison  is a 41 y.o. female with a known history of myoclonic dystrophy, anxiety, GERD,hydrocephalus,poor historian, is brought into the ED by her care giver as patient is not feeling well Patient was seen at urgent care on Friday, throat cultures were obtained and patient was given a prescription for azithromycin for possible strep throat,. Both conjunctiva are red and patient is brought into the  Emergency department.Chest x-ray has revealed bilateral  infiltrates  #Acute respiratory failure Continue 6 L of oxygen Due to pneumonia I will asked pulmonary to see patient may need bronchoscopy I will obtain a CT scan with PE to further evaluate  # sepsis from aspiration pneumonia  Continue therapy with IV Zosyn Positive blood cultures likely contamination Due to severity of illness I will ask infectious disease to come see the patient   # erythematous rash Solumedrol for likely allergic rash  #Bilateral conjunctivitis continue erythromycin ointment some improvement   #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           Consults   none   DVT Prophylaxis  Lovenox    Lab Results  Component Value Date   PLT 79 (L) 05/18/2017     Time Spent in minutes   35 minutes of critical care time spent Greater than 50% of time spent in care coordination and counseling patient regarding the condition and plan of care.   Auburn Bilberry M.D on 05/18/2017 at 10:47 AM  Between 7am to 6pm - Pager - [phone]  After 6pm go to [email or web address] - password [email or web address] [facility]  [facility]   Office  [phone]

## 2017-05-19 ENCOUNTER — Inpatient Hospital Stay: Payer: Medicaid Other

## 2017-05-19 DIAGNOSIS — A419 Sepsis, unspecified organism: Principal | ICD-10-CM

## 2017-05-19 LAB — CULTURE, BLOOD (ROUTINE X 2)

## 2017-05-19 LAB — GLUCOSE, CAPILLARY
GLUCOSE-CAPILLARY: 152 mg/dL — AB (ref 65–99)
Glucose-Capillary: 143 mg/dL — ABNORMAL HIGH (ref 65–99)
Glucose-Capillary: 147 mg/dL — ABNORMAL HIGH (ref 65–99)

## 2017-05-19 LAB — BASIC METABOLIC PANEL
ANION GAP: 6 (ref 5–15)
BUN: 24 mg/dL — ABNORMAL HIGH (ref 6–20)
CALCIUM: 7.9 mg/dL — AB (ref 8.9–10.3)
CHLORIDE: 120 mmol/L — AB (ref 101–111)
CO2: 22 mmol/L (ref 22–32)
Creatinine, Ser: 0.84 mg/dL (ref 0.44–1.00)
GFR calc non Af Amer: >60 mL/min (ref >60)
Glucose, Bld: 183 mg/dL — ABNORMAL HIGH (ref 65–99)
Potassium: 3 mmol/L — ABNORMAL LOW (ref 3.5–5.1)
SODIUM: 148 mmol/L — AB (ref 135–145)

## 2017-05-19 LAB — CBC
HEMATOCRIT: 36.3 % (ref 35.0–47.0)
HEMOGLOBIN: 11.8 g/dL — AB (ref 12.0–16.0)
MCH: 28.5 pg (ref 26.0–34.0)
MCHC: 32.4 g/dL (ref 32.0–36.0)
MCV: 88 fL (ref 80.0–100.0)
Platelets: 80 10*3/uL — ABNORMAL LOW (ref 150–440)
RBC: 4.12 MIL/uL (ref 3.80–5.20)
RDW: 15.7 % — AB (ref 11.5–14.5)
WBC: 21 10*3/uL — ABNORMAL HIGH (ref 3.6–11.0)

## 2017-05-19 LAB — PHOSPHORUS
PHOSPHORUS: 2.5 mg/dL (ref 2.5–4.6)
Phosphorus: 1.5 mg/dL — ABNORMAL LOW (ref 2.5–4.6)

## 2017-05-19 LAB — RESPIRATORY PANEL BY PCR
ADENOVIRUS-RVPPCR: NOT DETECTED
Bordetella pertussis: NOT DETECTED
CHLAMYDOPHILA PNEUMONIAE-RVPPCR: NOT DETECTED
CORONAVIRUS NL63-RVPPCR: NOT DETECTED
Coronavirus 229E: NOT DETECTED
Coronavirus HKU1: NOT DETECTED
Coronavirus OC43: NOT DETECTED
INFLUENZA A-RVPPCR: NOT DETECTED
INFLUENZA B-RVPPCR: NOT DETECTED
MYCOPLASMA PNEUMONIAE-RVPPCR: NOT DETECTED
Metapneumovirus: NOT DETECTED
PARAINFLUENZA VIRUS 3-RVPPCR: NOT DETECTED
PARAINFLUENZA VIRUS 4-RVPPCR: NOT DETECTED
Parainfluenza Virus 1: NOT DETECTED
Parainfluenza Virus 2: NOT DETECTED
RESPIRATORY SYNCYTIAL VIRUS-RVPPCR: NOT DETECTED
RHINOVIRUS / ENTEROVIRUS - RVPPCR: NOT DETECTED

## 2017-05-19 LAB — MAGNESIUM: Magnesium: 2.2 mg/dL (ref 1.7–2.4)

## 2017-05-19 LAB — TSH: TSH: 0.438 u[IU]/mL (ref 0.350–4.500)

## 2017-05-19 LAB — PROCALCITONIN: Procalcitonin: 0.92 ng/mL

## 2017-05-19 LAB — POTASSIUM: POTASSIUM: 3.5 mmol/L (ref 3.5–5.1)

## 2017-05-19 LAB — TROPONIN I: Troponin I: 0.07 ng/mL (ref <0.03)

## 2017-05-19 MED ORDER — METOPROLOL TARTRATE 5 MG/5ML IV SOLN
5.0000 mg | INTRAVENOUS | Status: AC
  Administered 2017-05-19: 5 mg via INTRAVENOUS

## 2017-05-19 MED ORDER — AMIODARONE HCL IN DEXTROSE 360-4.14 MG/200ML-% IV SOLN
30.0000 mg/h | INTRAVENOUS | Status: DC
  Administered 2017-05-19 (×2): 30 mg/h via INTRAVENOUS
  Filled 2017-05-19 (×2): qty 200

## 2017-05-19 MED ORDER — PRO-STAT SUGAR FREE PO LIQD
30.0000 mL | Freq: Two times a day (BID) | ORAL | Status: DC
  Administered 2017-05-19 – 2017-05-21 (×5): 30 mL

## 2017-05-19 MED ORDER — METOPROLOL TARTRATE 5 MG/5ML IV SOLN
INTRAVENOUS | Status: AC
  Filled 2017-05-19: qty 5

## 2017-05-19 MED ORDER — FREE WATER
150.0000 mL | Freq: Four times a day (QID) | Status: DC
  Administered 2017-05-19 – 2017-05-21 (×8): 150 mL

## 2017-05-19 MED ORDER — SODIUM CHLORIDE 0.9 % IV BOLUS (SEPSIS)
500.0000 mL | Freq: Once | INTRAVENOUS | Status: AC
  Administered 2017-05-19: 500 mL via INTRAVENOUS

## 2017-05-19 MED ORDER — AMIODARONE IV BOLUS ONLY 150 MG/100ML: 150.0000 mg | Freq: Once | INTRAVENOUS | Status: DC

## 2017-05-19 MED ORDER — AMIODARONE HCL IN DEXTROSE 360-4.14 MG/200ML-% IV SOLN
60.0000 mg/h | INTRAVENOUS | Status: AC
  Administered 2017-05-19 (×2): 60 mg/h via INTRAVENOUS
  Filled 2017-05-19: qty 200

## 2017-05-19 MED ORDER — AMIODARONE LOAD VIA INFUSION
150.0000 mg | Freq: Once | INTRAVENOUS | Status: AC
  Administered 2017-05-19: 150 mg via INTRAVENOUS
  Filled 2017-05-19: qty 83.34

## 2017-05-19 MED ORDER — POTASSIUM PHOSPHATES 15 MMOLE/5ML IV SOLN
30.0000 mmol | Freq: Once | INTRAVENOUS | Status: AC
  Administered 2017-05-19: 30 mmol via INTRAVENOUS
  Filled 2017-05-19: qty 10

## 2017-05-19 MED ORDER — VITAL HIGH PROTEIN PO LIQD: 1000.0000 mL | ORAL | Status: DC

## 2017-05-19 MED ORDER — INSULIN ASPART 100 UNIT/ML ~~LOC~~ SOLN
0.0000 [IU] | SUBCUTANEOUS | Status: DC
  Administered 2017-05-19 (×2): 2 [IU] via SUBCUTANEOUS
  Administered 2017-05-19: 3 [IU] via SUBCUTANEOUS
  Administered 2017-05-20 (×2): 2 [IU] via SUBCUTANEOUS
  Administered 2017-05-20: 3 [IU] via SUBCUTANEOUS
  Administered 2017-05-20: 5 [IU] via SUBCUTANEOUS
  Administered 2017-05-20: 3 [IU] via SUBCUTANEOUS
  Administered 2017-05-20: 2 [IU] via SUBCUTANEOUS
  Administered 2017-05-21: 3 [IU] via SUBCUTANEOUS
  Administered 2017-05-21: 2 [IU] via SUBCUTANEOUS
  Administered 2017-05-21: 5 [IU] via SUBCUTANEOUS
  Administered 2017-05-21 (×3): 3 [IU] via SUBCUTANEOUS
  Administered 2017-05-22 – 2017-05-24 (×8): 2 [IU] via SUBCUTANEOUS
  Filled 2017-05-19 (×23): qty 1

## 2017-05-19 MED ORDER — VITAL AF 1.2 CAL PO LIQD
1000.0000 mL | ORAL | Status: DC
  Administered 2017-05-19 – 2017-05-22 (×3): 1000 mL

## 2017-05-19 MED ORDER — POTASSIUM CHLORIDE 10 MEQ/50ML IV SOLN
10.0000 meq | INTRAVENOUS | Status: AC
  Administered 2017-05-19 (×4): 10 meq via INTRAVENOUS
  Filled 2017-05-19 (×4): qty 50

## 2017-05-19 NOTE — Progress Notes (Signed)
Per Dr. Nicholos Johns OG tube okay to use. Trudee Kuster

## 2017-05-19 NOTE — Progress Notes (Signed)
MEDICATION RELATED CONSULT NOTE   Pharmacy Consult for electrolyte monitoring  Indication: hypokalemia  Allergies  Allergen Reactions  . Belladonna Alkaloids Other (See Comments)    Unsure of reaction Unsure of reaction    Patient Measurements: Height:  (167.6 cm) Weight: 161 lb (73 kg) IBW/kg (Calculated) : 59.3  Vital Signs: Temp: 98.5 F (36.9 C) (09/26 2000) Temp Source: Oral (09/26 2000) BP: 81/64 (09/26 2030) Pulse Rate: 139 (09/26 2030) Intake/Output from previous day: 09/25 0701 - 09/26 0700 In: 3145 [I.V.:45; IV Piggyback:3100] Out: 425 [Urine:425] Intake/Output from this shift: No intake/output data recorded.  Labs:  Recent Labs  05/18/17 1006 05/19/17 0508 05/19/17 1821  WBC 17.8* 21.0*  --   HGB 12.7 11.8*  --   HCT 38.9 36.3  --   PLT 79* 80*  --   CREATININE 0.73 0.84  --   MG  --  2.2  --   PHOS  --  2.5 1.5*   Estimated Creatinine Clearance: 89.3 mL/min (by C-G formula based on SCr of 0.84 mg/dL).     Assessment: Lindsey Allison is a 63 YOF with a known history of myoclonic dystrophy, anxiety, GERD, hydrocephalus that presented to the ED with her care giver because she was not feeling well. Patient was found to have bilateral infiltrates and is being treated for aspiration pneumonia.  Plan:  Potassium phosphate IV x 1.   Will continue to monitor with am labs.   Pharmacy will continue to follow per consult.   Lindsey Allison, PharmD Student 05/19/2017,9:20 PM

## 2017-05-19 NOTE — Progress Notes (Signed)
Patient's HR still intermittently in 150's after starting amio gtt. Annabelle Harman, NP notified, no new orders. Will continue to assess. Trudee Kuster

## 2017-05-19 NOTE — Progress Notes (Signed)
[employer] [facility] [place] at [facility]                                                                                                                                                                                  Patient Demographics   Lindsey Allison, is a 41 y.o. female, DOB - 1976/02/02, ZOX:[id number]  Admit date - 2017/05/17   Admitting Physician Ramonita Lab, MD  Outpatient Primary MD for the patient is Burns, Shellia Cleverly, MD   LOS - 3  Subjective: Patient was intubated, remains on the ventilator  Review of Systems:   CONSTITUTIONAL:limited Due to patient's illness  Vitals:   Vitals:   05/19/17 0734 05/19/17 0800 05/19/17 1103 05/19/17 1200  BP:  (!) 101/48  (!) 90/52  Pulse:  (!) 111  (!) 113  Resp:  (!) 23  (!) 26  Temp:  99.5 F (37.5 C)  98.2 F (36.8 C)  TempSrc:  Oral  Oral  SpO2: 97% 97% 97% 97%  Weight:      Height:        Wt Readings from Last 3 Encounters:  05/17/17 161 lb (73 kg)  04/04/17 142 lb (64.4 kg)  01/12/17 145 lb (65.8 kg)     Intake/Output Summary (Last 24 hours) at 05/19/17 1419 Last data filed at 05/19/17 1204  Gross per 24 hour  Intake             3334 ml  Output              560 ml  Net             2774 ml    Physical Exam:   GENERAL: intubated  HEAD, EYES, EARS, NOSE AND THROAT: Bilateral conjunctival erythema lip swelling NECK: Supple. There is no jugular venous distention. No bruits, no lymphadenopathy, no thyromegaly.  HEART: Regular rate and rhythm,. No murmurs, no rubs, no clicks.  LUNGS: decreased breath sounds bilaterally ABDOMEN: Soft, flat, nontender, nondistended. Has good bowel sounds. No hepatosplenomegaly appreciated.  EXTREMITIES: No evidence of any cyanosis, clubbing, or peripheral edema.  +2 pedal and radial pulses bilaterally.  NEUROLOGIC: intubated SKIN: Erythematous rash all over her chest And abdomen Psych: intubated LN: No inguinal LN enlargement    Antibiotics   Anti-infectives     Start     Dose/Rate Route Frequency Ordered Stop   05/18/17 0900  piperacillin-tazobactam (ZOSYN) IVPB 3.375 g     3.375 g 12.5 mL/hr over 240 Minutes Intravenous Every 8 hours 05/18/17 0851     05/17/17 0200  vancomycin (VANCOCIN) IVPB 750 mg/150 ml premix  Status:  Discontinued     750 mg 150 mL/hr over  60 Minutes Intravenous Every 8 hours May 22, 2017 1857 2017/05/22 1902   05/22/2017 2100  ceFEPIme (MAXIPIME) 2 g in dextrose 5 % 50 mL IVPB  Status:  Discontinued     2 g 100 mL/hr over 30 Minutes Intravenous Every 8 hours 05-22-17 1740 05/18/17 0851   05-22-17 2100  vancomycin (VANCOCIN) IVPB 750 mg/150 ml premix  Status:  Discontinued     750 mg 150 mL/hr over 60 Minutes Intravenous Every 8 hours 2017/05/22 1902 05/18/17 0851   May 22, 2017 2000  vancomycin (VANCOCIN) IVPB 750 mg/150 ml premix  Status:  Discontinued     750 mg 150 mL/hr over 60 Minutes Intravenous  Once May 22, 2017 1856 May 22, 2017 1859   2017/05/22 1416  piperacillin-tazobactam (ZOSYN) 3-0.375 GM/50ML IVPB    Comments:  Hatch, Shannin   : cabinet override      2017/05/22 1416 05/17/17 0229   May 22, 2017 1400  vancomycin (VANCOCIN) IVPB 1000 mg/200 mL premix     1,000 mg 200 mL/hr over 60 Minutes Intravenous  Once 05/22/17 1350 May 22, 2017 1643   05-22-17 1345  vancomycin (VANCOCIN) injection 1 g  Status:  Discontinued     1 g Intravenous  Once 2017/05/22 1345 22-May-2017 1350   May 22, 2017 1345  piperacillin-tazobactam (ZOSYN) IVPB 3.375 g     3.375 g 100 mL/hr over 30 Minutes Intravenous  Once 05/22/2017 1345 2017-05-22 1504      Medications   Scheduled Meds: . budesonide (PULMICORT) nebulizer solution  0.5 mg Nebulization BID  . chlorhexidine  15 mL Mouth/Throat BID  . erythromycin   Both Eyes Q6H  . feeding supplement (PRO-STAT SUGAR FREE 64)  30 mL Per Tube BID  . fentaNYL (SUBLIMAZE) injection  50 mcg Intravenous Once  . FLUoxetine  20 mg Per Tube Daily  . free water  150 mL Per Tube Q6H  . insulin aspart  0-15 Units Subcutaneous Q4H  .  ipratropium-albuterol  3 mL Nebulization Q4H  . loratadine  10 mg Oral Daily  . mouth rinse  15 mL Mouth Rinse 10 times per day  . methylPREDNISolone (SOLU-MEDROL) injection  60 mg Intravenous Q12H  . senna-docusate  1 tablet Oral BID  . sodium chloride flush  10-40 mL Intracatheter Q12H   Continuous Infusions: . amiodarone 60 mg/hr (05/19/17 1415)   Followed by  . amiodarone    . famotidine (PEPCID) IV Stopped (05/19/17 1113)  . feeding supplement (VITAL AF 1.2 CAL) 1,000 mL (05/19/17 1338)  . fentaNYL infusion INTRAVENOUS 25 mcg/hr (05/19/17 1051)  . piperacillin-tazobactam (ZOSYN)  IV Stopped (05/19/17 0924)  . sodium chloride Stopped (05/18/17 1757)   PRN Meds:.acetaminophen, fentaNYL, midazolam, midazolam, sodium chloride, sodium chloride flush   Data Review:   Micro Results Recent Results (from the past 240 hour(s))  Rapid strep screen     Status: None   Collection Time: 05/14/17  5:40 PM  Result Value Ref Range Status   Streptococcus, Group A Screen (Direct) NEGATIVE NEGATIVE Final    Comment: (NOTE) A Rapid Antigen test may result negative if the antigen level in the sample is below the detection level of this test. The FDA has not cleared this test as a stand-alone test therefore the rapid antigen negative result has reflexed to a Group A Strep culture.   Culture, group A strep     Status: None   Collection Time: 05/14/17  5:40 PM  Result Value Ref Range Status   Specimen Description THROAT  Final   Special Requests NONE Reflexed from [id number]  Final  Culture   Final    NO GROUP A STREP (S.PYOGENES) ISOLATED Performed at [facility], [address] [place], [place] [address]    Report Status 05/17/2017 FINAL  Final  Culture, blood (routine x 2)     Status: Abnormal   Collection Time: 05/23/2017  1:58 PM  Result Value Ref Range Status   Specimen Description BLOOD LT ARM  Final   Special Requests   Final    BOTTLES DRAWN AEROBIC AND ANAEROBIC Blood Culture  results may not be optimal due to an inadequate volume of blood received in culture bottles   Culture  Setup Time   Final    GRAM POSITIVE COCCI ANAEROBIC BOTTLE ONLY CRITICAL RESULT CALLED TO, READ BACK BY AND VERIFIED WITH: LISA KLUTTZ AT 1025 05/17/17 SDR    Culture (A)  Final    STAPHYLOCOCCUS SPECIES (COAGULASE NEGATIVE) THE SIGNIFICANCE OF ISOLATING THIS ORGANISM FROM A SINGLE SET OF BLOOD CULTURES WHEN MULTIPLE SETS ARE DRAWN IS UNCERTAIN. PLEASE NOTIFY THE MICROBIOLOGY DEPARTMENT WITHIN ONE WEEK IF SPECIATION AND SENSITIVITIES ARE REQUIRED. Performed at [facility], [address], [place], [place] [address]    Report Status 05/19/2017 FINAL  Final  Blood Culture ID Panel (Reflexed)     Status: Abnormal   Collection Time: 05/23/2017  1:58 PM  Result Value Ref Range Status   Enterococcus species NOT DETECTED NOT DETECTED Final   Listeria monocytogenes NOT DETECTED NOT DETECTED Final   Staphylococcus species DETECTED (A) NOT DETECTED Final    Comment: Methicillin (oxacillin) resistant coagulase negative staphylococcus. Possible blood culture contaminant (unless isolated from more than one blood culture draw or clinical case suggests pathogenicity). No antibiotic treatment is indicated for blood  culture contaminants. CRITICAL RESULT CALLED TO, READ BACK BY AND VERIFIED WITH: LISA KLUTZ ON 05/17/17 AT 1025 SDR    Staphylococcus aureus NOT DETECTED NOT DETECTED Final   Methicillin resistance DETECTED (A) NOT DETECTED Final    Comment: CRITICAL RESULT CALLED TO, READ BACK BY AND VERIFIED WITH: LISA KLUTZ ON 05/17/17 AT 1025 SDR    Streptococcus species NOT DETECTED NOT DETECTED Final   Streptococcus agalactiae NOT DETECTED NOT DETECTED Final   Streptococcus pneumoniae NOT DETECTED NOT DETECTED Final   Streptococcus pyogenes NOT DETECTED NOT DETECTED Final   Acinetobacter baumannii NOT DETECTED NOT DETECTED Final   Enterobacteriaceae species NOT DETECTED NOT DETECTED Final    Enterobacter cloacae complex NOT DETECTED NOT DETECTED Final   Escherichia coli NOT DETECTED NOT DETECTED Final   Klebsiella oxytoca NOT DETECTED NOT DETECTED Final   Klebsiella pneumoniae NOT DETECTED NOT DETECTED Final   Proteus species NOT DETECTED NOT DETECTED Final   Serratia marcescens NOT DETECTED NOT DETECTED Final   Haemophilus influenzae NOT DETECTED NOT DETECTED Final   Neisseria meningitidis NOT DETECTED NOT DETECTED Final   Pseudomonas aeruginosa NOT DETECTED NOT DETECTED Final   Candida albicans NOT DETECTED NOT DETECTED Final   Candida glabrata NOT DETECTED NOT DETECTED Final   Candida krusei NOT DETECTED NOT DETECTED Final   Candida parapsilosis NOT DETECTED NOT DETECTED Final   Candida tropicalis NOT DETECTED NOT DETECTED Final  Culture, blood (routine x 2)     Status: None (Preliminary result)   Collection Time: 05/23/2017  3:26 PM  Result Value Ref Range Status   Specimen Description BLOOD RT ARM  Final   Special Requests   Final    BOTTLES DRAWN AEROBIC AND ANAEROBIC Blood Culture results may not be optimal due to  an inadequate volume of blood received in culture bottles   Culture NO GROWTH 3 DAYS  Final   Report Status PENDING  Incomplete  Urine culture     Status: None   Collection Time: 05/21/2017  4:57 PM  Result Value Ref Range Status   Specimen Description URINE, CLEAN CATCH  Final   Special Requests NONE  Final   Culture   Final    NO GROWTH Performed at [facility], [address] [place], [place] [address]    Report Status 05/18/2017 FINAL  Final  Culture, blood (routine x 2) Call MD if unable to obtain prior to antibiotics being given     Status: None (Preliminary result)   Collection Time: 05/21/2017  5:54 PM  Result Value Ref Range Status   Specimen Description BLOOD LT WRIST  Final   Special Requests   Final    BOTTLES DRAWN AEROBIC AND ANAEROBIC Blood Culture adequate volume   Culture NO GROWTH 3 DAYS  Final   Report Status PENDING   Incomplete  MRSA PCR Screening     Status: None   Collection Time: 05/19/2017  6:35 PM  Result Value Ref Range Status   MRSA by PCR NEGATIVE NEGATIVE Final    Comment:        The GeneXpert MRSA Assay (FDA approved for NASAL specimens only), is one component of a comprehensive MRSA colonization surveillance program. It is not intended to diagnose MRSA infection nor to guide or monitor treatment for MRSA infections.   Culture, blood (routine x 2) Call MD if unable to obtain prior to antibiotics being given     Status: None (Preliminary result)   Collection Time: 04/28/2017  7:10 PM  Result Value Ref Range Status   Specimen Description BLOOD RIGHT WRIST  Final   Special Requests   Final    BOTTLES DRAWN AEROBIC AND ANAEROBIC Blood Culture results may not be optimal due to an excessive volume of blood received in culture bottles   Culture NO GROWTH 3 DAYS  Final   Report Status PENDING  Incomplete  Respiratory Panel by PCR     Status: None   Collection Time: 05/18/17  2:21 PM  Result Value Ref Range Status   Adenovirus NOT DETECTED NOT DETECTED Final   Coronavirus 229E NOT DETECTED NOT DETECTED Final   Coronavirus HKU1 NOT DETECTED NOT DETECTED Final   Coronavirus NL63 NOT DETECTED NOT DETECTED Final   Coronavirus OC43 NOT DETECTED NOT DETECTED Final   Metapneumovirus NOT DETECTED NOT DETECTED Final   Rhinovirus / Enterovirus NOT DETECTED NOT DETECTED Final   Influenza A NOT DETECTED NOT DETECTED Final   Influenza B NOT DETECTED NOT DETECTED Final   Parainfluenza Virus 1 NOT DETECTED NOT DETECTED Final   Parainfluenza Virus 2 NOT DETECTED NOT DETECTED Final   Parainfluenza Virus 3 NOT DETECTED NOT DETECTED Final   Parainfluenza Virus 4 NOT DETECTED NOT DETECTED Final   Respiratory Syncytial Virus NOT DETECTED NOT DETECTED Final   Bordetella pertussis NOT DETECTED NOT DETECTED Final   Chlamydophila pneumoniae NOT DETECTED NOT DETECTED Final   Mycoplasma pneumoniae NOT DETECTED NOT  DETECTED Final    Comment: Performed at [facility], [address] [place], [place] [address]  Culture, respiratory (NON-Expectorated)     Status: None (Preliminary result)   Collection Time: 05/18/17  2:21 PM  Result Value Ref Range Status   Specimen Description SPUTUM  Final   Special Requests NONE  Final  Gram Stain   Final    ABUNDANT WBC PRESENT, PREDOMINANTLY MONONUCLEAR ABUNDANT YEAST    Culture   Final    CULTURE REINCUBATED FOR BETTER GROWTH Performed at [facility], [address] [place], [place] [address]    Report Status PENDING  Incomplete  Culture, respiratory (NON-Expectorated)     Status: None (Preliminary result)   Collection Time: 05/19/17  1:40 AM  Result Value Ref Range Status   Specimen Description SPUTUM  Final   Special Requests NONE  Final   Gram Stain   Final    ABUNDANT WBC PRESENT, PREDOMINANTLY PMN MODERATE YEAST WITH PSEUDOHYPHAE Performed at [facility], [address], [place], [place] [address]    Culture PENDING  Incomplete   Report Status PENDING  Incomplete    Radiology Reports Dg Chest 2 View  Result Date: 04/30/2017 CLINICAL DATA:  Cough and lethargy EXAM: CHEST  2 VIEW COMPARISON:  01/12/2017 FINDINGS: Cardiac shadow is within normal limits. The lungs are well aerated bilaterally. Patchy infiltrative changes are seen in the left mid and lower lung as well as the right lung base. No sizable effusion is seen. No bony abnormality is noted. Ventricular shunt is noted on the right. IMPRESSION: Bilateral infiltrates as described. Electronically Signed   By: Alcide Clever M.D.   On: 05/21/2017 12:37   Ct Angio Chest Pe W Or Wo Contrast  Result Date: 05/18/2017 CLINICAL DATA:  Shortness of breath. EXAM: CT ANGIOGRAPHY CHEST WITH CONTRAST TECHNIQUE: Multidetector CT imaging of the chest was performed using the standard protocol during bolus administration of intravenous contrast. Multiplanar CT image reconstructions and MIPs  were obtained to evaluate the vascular anatomy. CONTRAST:  75 mL of Isovue 370 intravenously. COMPARISON:  Radiographs of May 17, 2017. FINDINGS: Cardiovascular: Satisfactory opacification of the pulmonary arteries to the segmental level. No evidence of pulmonary embolism. Normal heart size. No pericardial effusion. Mediastinum/Nodes: No enlarged mediastinal, hilar, or axillary lymph nodes. Thyroid gland, trachea, and esophagus demonstrate no significant findings. Lungs/Pleura: No pneumothorax is noted. Large left upper and lower lobe airspace opacities are noted most consistent with pneumonia. Smaller right upper and lower lobe opacities are also noted most consistent with pneumonia. Mild bilateral pleural effusions are noted. Upper Abdomen: No acute abnormality. Musculoskeletal: No chest wall abnormality. No acute or significant osseous findings. Review of the MIP images confirms the above findings. IMPRESSION: No definite evidence of pulmonary embolus. Bilateral upper and lower lobe airspace opacities are noted most consistent with pneumonia. Mild bilateral pleural effusions are noted. Electronically Signed   By: Lupita Raider, M.D.   On: 05/18/2017 12:10   Dg Chest Port 1 View  Result Date: 05/18/2017 CLINICAL DATA:  Central line placement EXAM: PORTABLE CHEST 1 VIEW COMPARISON:  05/17/2017 FINDINGS: Left IJ approach central line catheter tip is seen at the cavoatrial junction. No pneumothorax. The tip of an endotracheal tube is 3.1 cm above the carina. Gastric tube extends below the left hemidiaphragm into the expected location of the stomach. VP shunt tubing projects over the right hemithorax and extends into the abdomen. The tip is excluded on this study. Patchy pneumonic consolidations with air bronchograms are seen in both upper lobes, lingula and left lower lobe. Heart size is normal. The aorta is obscured by the adjacent pneumonia. IMPRESSION: 1. Satisfactory support line and tube positions.  2. Left IJ central line catheter seen with tip at the cavoatrial junction. No pneumothorax. 3. Patchy multilobar pneumonia with air bronchograms. Electronically  Signed   By: Tollie Eth M.D.   On: 05/18/2017 19:47   Dg Chest Port 1 View  Result Date: 05/17/2017 CLINICAL DATA:  Cough EXAM: PORTABLE CHEST 1 VIEW COMPARISON:  Chest radiograph 06/06/17 at 12:07 p.m. FINDINGS: Marked worsening of consolidative opacity in the left mid lung compared to the earlier study. Aeration at the right lung base is improved. No pneumothorax or pleural effusion. IMPRESSION: Marked worsening of left mid lung consolidation, likely infection or aspiration. Electronically Signed   By: Deatra Robinson M.D.   On: 05/17/2017 02:18   Dg Abd Portable 1v  Result Date: 05/18/2017 CLINICAL DATA:  OG tube placement EXAM: PORTABLE ABDOMEN - 1 VIEW COMPARISON:  None. FINDINGS: The tip and side port of a gastric tube are seen below the left hemidiaphragm in the expected location of the stomach. The tip however appears to coiled back toward the GE junction band is approximately 2 cm from the GE junction. Scattered gas containing small and large bowel loops without obstructive pattern. No free air. Contrast is seen within both renal collecting systems likely from recent administration of IV contrast. VP shunt tip projects over the left lower quadrant. Cholecystectomy clips are seen in the right upper quadrant. IMPRESSION: 1. Gastric tube tip and side-port are seen in the expected location of stomach however the tip projects back toward the GE junction and is seen approximately 2 cm from the expected location of the gastroesophageal juncture. 2. VP shunt tubing tip is seen in the left lower quadrant of the abdomen. Electronically Signed   By: Tollie Eth M.D.   On: 05/18/2017 19:44     CBC  Recent Labs Lab 05/14/17 1745 06/06/2017 1311 05/18/17 1006 05/19/17 0508  WBC 14.0* 14.4* 17.8* 21.0*  HGB 13.3 13.5 12.7 11.8*  HCT 40.2 41.2  38.9 36.3  PLT 121* 98* 79* 80*  MCV 88.7 88.1 87.8 88.0  MCH 29.4 28.8 28.7 28.5  MCHC 33.1 32.7 32.7 32.4  RDW 14.9* 15.3* 15.5* 15.7*  LYMPHSABS 1.0 1.1 1.0  --   MONOABS 0.7 0.4 0.8  --   EOSABS 0.2 0.1 0.0  --   BASOSABS 0.1 0.0 0.1  --     Chemistries   Recent Labs Lab 06/06/17 1311 05/18/17 1006 05/19/17 0508  NA 138 146* 148*  K 4.0 3.4* 3.0*  CL 102 115* 120*  CO2 25 23 22   GLUCOSE 97 128* 183*  BUN 16 18 24*  CREATININE 0.72 0.73 0.84  CALCIUM 8.6* 8.1* 7.9*  MG  --   --  2.2  AST 58*  --   --   ALT 61*  --   --   ALKPHOS 124  --   --   BILITOT 2.0*  --   --    ------------------------------------------------------------------------------------------------------------------ estimated creatinine clearance is 89.3 mL/min (by C-G formula based on SCr of 0.84 mg/dL). ------------------------------------------------------------------------------------------------------------------ No results for input(s): HGBA1C in the last 72 hours. ------------------------------------------------------------------------------------------------------------------ No results for input(s): CHOL, HDL, LDLCALC, TRIG, CHOLHDL, LDLDIRECT in the last 72 hours. ------------------------------------------------------------------------------------------------------------------ No results for input(s): TSH, T4TOTAL, T3FREE, THYROIDAB in the last 72 hours.  Invalid input(s): FREET3 ------------------------------------------------------------------------------------------------------------------ No results for input(s): VITAMINB12, FOLATE, FERRITIN, TIBC, IRON, RETICCTPCT in the last 72 hours.  Coagulation profile No results for input(s): INR, PROTIME in the last 168 hours.  No results for input(s): DDIMER in the last 72 hours.  Cardiac Enzymes  Recent Labs Lab Jun 06, 2017 1631 Jun 06, 2017 1922 05/17/17 0042  TROPONINI 0.18* 0.03* 0.03*    ------------------------------------------------------------------------------------------------------------------  Invalid input(s): POCBNP    Assessment & Plan  Lindsey Allison  is a 41 y.o. female with a known history of myoclonic dystrophy, anxiety, GERD,hydrocephalus,poor historian, is brought into the ED by her care giver as patient is not feeling well Patient was seen at urgent care on Friday, throat cultures were obtained and patient was given a prescription for azithromycin for possible strep throat,. 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           Consults   none   DVT Prophylaxis  Lovenox    Lab Results  Component Value Date   PLT 80 (L) 05/19/2017     Time Spent in minutes   35 minutes spent  Auburn Bilberry M.D on 05/19/2017 at 2:19 PM  Between 7am to 6pm - Pager - [phone]  After 6pm go to [email or web address] - password [email or web address] [facility]  [facility]   Office  [phone]

## 2017-05-19 NOTE — Progress Notes (Signed)
MEDICATION RELATED CONSULT NOTE   Pharmacy Consult for electrolyte monitoring  Indication: hypokalemia  Allergies  Allergen Reactions  . Belladonna Alkaloids Other (See Comments)    Unsure of reaction Unsure of reaction    Patient Measurements: Height:  (167.6 cm) Weight: 161 lb (73 kg) IBW/kg (Calculated) : 59.3  Vital Signs: Temp: 99.5 F (37.5 C) (09/26 0800) Temp Source: Oral (09/26 0800) BP: 101/48 (09/26 0800) Pulse Rate: 111 (09/26 0800) Intake/Output from previous day: 09/25 0701 - 09/26 0700 In: 3145 [I.V.:45; IV Piggyback:3100] Out: 425 [Urine:425] Intake/Output from this shift: Total I/O In: 39 [I.V.:39] Out: 60 [Urine:60]  Labs:  Recent Labs  2017-05-27 1311 05/18/17 1006 05/19/17 0508  WBC 14.4* 17.8* 21.0*  HGB 13.5 12.7 11.8*  HCT 41.2 38.9 36.3  PLT 98* 79* 80*  CREATININE 0.72 0.73 0.84  MG  --   --  2.2  ALBUMIN 3.1*  --   --   PROT 6.2*  --   --   AST 58*  --   --   ALT 61*  --   --   ALKPHOS 124  --   --   BILITOT 2.0*  --   --    Estimated Creatinine Clearance: 89.3 mL/min (by C-G formula based on SCr of 0.84 mg/dL).     Assessment: Lindsey Allison is a 50 YOF with a known history of myoclonic dystrophy, anxiety, GERD, hydrocephalus that presented to the ED with her care giver because she was not feeling well. Patient was found to have bilateral infiltrates and is being treated for aspiration pneumonia.  Plan:  Ordered potassium chloride 10 mEq in central line q1h x 4.   Will recheck potassium at 1800.   Will continue to monitor with am labs.   Pharmacy will continue to follow per consult.   Dwain Sarna, PharmD Student 05/19/2017,10:48 AM

## 2017-05-19 NOTE — Progress Notes (Signed)
[facility]  PATIENT NAME: Lindsey Allison    HISTORY OF PRESENT ILLNESS:  41 yo female from a group home who is a ward of state with hx of muscular dystrophy and hydrocephalus admitted to [facility] unit on 09/23 with bilateral pneumonia requiring transfer to ICU 09/25 due to worsening acute respiratory failure requiring mechanical intubation.    REVIEW OF SYSTEMS:  Unobtainable due to critical illness  VITAL SIGNS:  Blood pressure (!) 101/48, pulse (!) 111, temperature 99.5 F (37.5 C), temperature source Oral, resp. rate (!) 23, height _0  (1.676 m), weight 73 kg (161 lb), SpO2 97 %.  PHYSICAL EXAMINATION:  GENERAL: acutely ill appearing Caucasian female, NAD mechanically intubated HEENT: supple, no JVD LUNGS: rhonchi throughout, even, non labored; no rales, crackles, or wheezes  CARDIOVASCULAR: s1s2, rrr, no M/R/G ABDOMEN: +BS x4, soft, non tender, non distended  EXTREMITIES: 2+ pitting edema bilateral lower extremities  NEUROLOGIC: follows commands, PERRL  SKIN: No obvious rash, lesion, or ulcer.   LABORATORY PANEL:   CBC  Recent Labs Lab 05/19/17 0508  WBC 21.0*  HGB 11.8*  HCT 36.3  PLT 80*   ------------------------------------------------------------------------------------------------------------------  Chemistries   Recent Labs Lab 05/21/2017 1311  05/19/17 0508  NA 138  < > 148*  K 4.0  < > 3.0*  CL 102  < > 120*  CO2 25  < > 22  GLUCOSE 97  < > 183*  BUN 16  < > 24*  CREATININE 0.72  < > 0.84  CALCIUM 8.6*  < > 7.9*  MG  --   --  2.2  AST 58*  --   --   ALT 61*  --   --   ALKPHOS 124  --   --   BILITOT 2.0*  --   --   < > = values in this interval not displayed. ------------------------------------------------------------------------------------------------------------------  Cardiac Enzymes  Recent Labs Lab 05/17/17 0042  TROPONINI 0.03*    ------------------------------------------------------------------------------------------------------------------  RADIOLOGY:  Ct Angio Chest Pe W Or Wo Contrast  Result Date: 05/18/2017 CLINICAL DATA:  Shortness of breath. EXAM: CT ANGIOGRAPHY CHEST WITH CONTRAST TECHNIQUE: Multidetector CT imaging of the chest was performed using the standard protocol during bolus administration of intravenous contrast. Multiplanar CT image reconstructions and MIPs were obtained to evaluate the vascular anatomy. CONTRAST:  75 mL of Isovue 370 intravenously. COMPARISON:  Radiographs of May 17, 2017. FINDINGS: Cardiovascular: Satisfactory opacification of the pulmonary arteries to the segmental level. No evidence of pulmonary embolism. Normal heart size. No pericardial effusion. Mediastinum/Nodes: No enlarged mediastinal, hilar, or axillary lymph nodes. Thyroid gland, trachea, and esophagus demonstrate no significant findings. Lungs/Pleura: No pneumothorax is noted. Large left upper and lower lobe airspace opacities are noted most consistent with pneumonia. Smaller right upper and lower lobe opacities are also noted most consistent with pneumonia. Mild bilateral pleural effusions are noted. Upper Abdomen: No acute abnormality. Musculoskeletal: No chest wall abnormality. No acute or significant osseous findings. Review of the MIP images confirms the above findings. IMPRESSION: No definite evidence of pulmonary embolus. Bilateral upper and lower lobe airspace opacities are noted most consistent with pneumonia. Mild bilateral pleural effusions are noted. Electronically Signed   By: Marijo Conception, M.D.   On: 05/18/2017 12:10   Dg Chest Port 1 View  Result Date: 05/18/2017 CLINICAL DATA:  Central line placement EXAM: PORTABLE CHEST 1 VIEW COMPARISON:  05/17/2017 FINDINGS: Left IJ approach central line catheter tip is seen at the cavoatrial junction. No pneumothorax.  The tip of an endotracheal tube is 3.1 cm above  the carina. Gastric tube extends below the left hemidiaphragm into the expected location of the stomach. VP shunt tubing projects over the right hemithorax and extends into the abdomen. The tip is excluded on this study. Patchy pneumonic consolidations with air bronchograms are seen in both upper lobes, lingula and left lower lobe. Heart size is normal. The aorta is obscured by the adjacent pneumonia. IMPRESSION: 1. Satisfactory support line and tube positions. 2. Left IJ central line catheter seen with tip at the cavoatrial junction. No pneumothorax. 3. Patchy multilobar pneumonia with air bronchograms. Electronically Signed   By: Ashley Royalty M.D.   On: 05/18/2017 19:47   Dg Abd Portable 1v  Result Date: 05/18/2017 CLINICAL DATA:  OG tube placement EXAM: PORTABLE ABDOMEN - 1 VIEW COMPARISON:  None. FINDINGS: The tip and side port of a gastric tube are seen below the left hemidiaphragm in the expected location of the stomach. The tip however appears to coiled back toward the GE junction band is approximately 2 cm from the GE junction. Scattered gas containing small and large bowel loops without obstructive pattern. No free air. Contrast is seen within both renal collecting systems likely from recent administration of IV contrast. VP shunt tip projects over the left lower quadrant. Cholecystectomy clips are seen in the right upper quadrant. IMPRESSION: 1. Gastric tube tip and side-port are seen in the expected location of stomach however the tip projects back toward the GE junction and is seen approximately 2 cm from the expected location of the gastroesophageal juncture. 2. VP shunt tubing tip is seen in the left lower quadrant of the abdomen. Electronically Signed   By: Ashley Royalty M.D.   On: 05/18/2017 19:44   EKG:   Orders placed or performed during the hospital encounter of 01/12/17  . ED EKG within 10 minutes  . ED EKG within 10 minutes  . EKG 12-Lead  . EKG 12-Lead  . EKG      ASSESSMENT/PLAN: Acute hypoxic respiratory failure secondary to bilateral pneumonia with possible ARDS  Septic shock with hypotension-resolved  Hypokalemia Hypernatremia   Lactic Acidosis-resolved  Leukocytosis Thrombocytopenia  Hyperglycemia  P: Continue full vent support wean as tolerated  SBT daily once parameters met Maintain O2 sats >92% Continue scheduled bronchodilator therapy Continue nebulized and IV steroids  Repeat CXR in am  Continue abx VAP bundle Maintain RASS goal 0 to -1 Continue fentanyl gtt to maintain RASS goal and for pain management  WUA daily  Follow cultures Trend WBC and monitor fever curve Trend PCT's Maintain map >65 Trend BMP  Replace electrolytes as indicated Monitor UOP ID and neurology consulted appreciate input SCD's for VTE prophylaxis, no chemical prophylaxis for now Monitor for s/sx of bleeding Trend CBC Transfuse for hgb <7 Will add SSI    Marda Stalker, [place] Pager [phone] (please enter 7 digits) PCCM Consult Pager [phone] (please enter 7 digits)  Deep Ashby Dawes, MD.   Board Certified in Internal Medicine, Pulmonary Medicine, [place], and Sleep Medicine.  [facility] Office Number: [phone] Pager: [phone]  Patricia Pesa, M.D.  Merton Border, M.D   [place].  I have personally obtained a history, examined the patient, evaluated laboratory and imaging results, formulated the assessment and plan and placed orders. The Patient requires high complexity decision making for assessment and support, frequent evaluation and titration of therapies, application of advanced monitoring technologies and  extensive interpretation of multiple databases. The patient has critical illness that could lead imminently to failure of 1 or more organ systems and requires the highest level of physician preparedness to intervene.  Critical Care Time  devoted to patient care services described in this note is 35 minutes and is exclusive of time spent in procedures supervisory time of NP.

## 2017-05-19 NOTE — Progress Notes (Signed)
Patient's HR elevated in 190's/200's. Annabelle Harman, NP notified, EKG ordered and IV metoprolol given. EKG showed afib RVR. HR decreased to 150's with metoprolol, amio gtt ordered and started. HR currently in 120's, still afib. Will continue to assess. Troponin drawn. Trudee Kuster

## 2017-05-19 NOTE — Progress Notes (Addendum)
Initial Nutrition Assessment  DOCUMENTATION CODES:   Not applicable  INTERVENTION:  Recommend initiating Vital AF 1.2 at 60 ml/hr via OGT (1440 ml goal daily volume). Provides 1728 kcal, 108 grams of protein, 1166 ml H2O daily. Utilize PEPuP protocol.  Recommend free water flush of 150 ml Q6hrs as patient is not currently on any IV fluids. Provides total of 2066 ml H2O including water in tube feeds.  Recommend monitoring phosphorus in setting of severe respiratory failure/ARDS and supplementing per MD discretion.  NUTRITION DIAGNOSIS:   Inadequate oral intake related to inability to eat as evidenced by NPO status.  GOAL:   Provide needs based on ASPEN/SCCM guidelines  MONITOR:   Vent status, Labs, Weight trends, TF tolerance, I & O's  REASON FOR ASSESSMENT:   Ventilator, Low Braden    ASSESSMENT:   41 year old female with PMHx of hydrocephalus, muscular dystrophy, anxiety, acid reflux, constipation who was brought in from group home as she was not feeling well and was admitted with sepsis from PNA. Required emergent intubation on 9/25 in setting of severe respiratory failure and ARDS.   No caregivers at bedside. Patient is a ward of the state. Unclear in chart which group home she is from. Per chart patient is mentally challenged and ambulates with walker and requires assistance with feeding at baseline. Patient was assessed by SLP on 9/24 and was placed on dysphagia 3 diet (with level 2 meats) and thin liquids. Per SLP note patient had been on a regular diet at group home. On 9/25 was downgraded to dysphagia 1 diet with nectar-thick liquids.  Per chart patient was 142 lbs on 04/04/2017 (however, does not appear like true measured weight). Will continue to monitor weight trend.  Access: OGT placed 9/25; verified to terminate in stomach per abdominal x-ray on 9/25, however tip projects back towards GE junction and is approximately 2 cm away from GE junction; 68 cm at corner of  mouth  MAP: 60-81 mmHg  Patient is currently intubated on ventilator support MV: 9 L/min Temp (24hrs), Avg:98.7 F (37.1 C), Min:98.2 F (36.8 C), Max:99.5 F (37.5 C)  Propofol: N/A  Medications reviewed and include: methylprednisolone 60 mg Q12hrs, senna, famotidine, fentanyl gtt, Zosyn, potassium chloride 10 mEq 4 times today.  Labs reviewed: CBG 113, Sodium 148, Potassium 3, Chloride 120, BUN 24.  Nutrition-Focused physical exam completed. Findings are no fat depletion, no muscle depletion, and moderate edema. Unable to assess muscle status of lower extremities in setting of edema. Skin very red on assessment.  Patient does not meet criteria for malnutrition at this time.  Discussed with RN. Also discussed on rounds. Plan is to initiate tube feeds today. Okay to use tube as it is.  Diet Order:  Diet NPO time specified  Skin:  Reviewed, no issues  Last BM:  05/18/2017 - small type 2  Height:   Ht Readings from Last 1 Encounters:  05/17/17  (1.676 m)    Weight:   Wt Readings from Last 1 Encounters:  05/17/17 161 lb (73 kg)    Ideal Body Weight:  59.1 kg  BMI:  Body mass index is 25.99 kg/m.  Estimated Nutritional Needs:   Kcal:  1799 (PSU 2003b w/ MSJ 1409, Ve 9, Tmax 37.5)  Protein:  90-110 grams (1.2-1.5 grams/kg)  Fluid:  2.2-2.6 L/day (30-35 ml/kg)  EDUCATION NEEDS:   No education needs identified at this time  Helane Rima, MS, RD, LDN Pager: [phone] After Hours Pager: [phone]

## 2017-05-19 NOTE — Progress Notes (Signed)
[facility] CLINIC INFECTIOUS DISEASE PROGRESS NOTE Date of Admission:  Jun 12, 2017     ID: Lindsey Allison is a 41 y.o. female with severe PNA  Active Problems:   Sepsis (HCC)   Pneumonia of both lungs due to infectious organism   Subjective: Transferred to unit and intubated. Great deal of sputum obtained at intubation. CX pending  ROS  Unable to obtain  Medications:  Antibiotics Given (last 72 hours)    Date/Time Action Medication Dose Rate   06/12/17 2135 New Bag/Given   ceFEPIme (MAXIPIME) 2 g in dextrose 5 % 50 mL IVPB 2 g 100 mL/hr   2017-06-12 2200 New Bag/Given   vancomycin (VANCOCIN) IVPB 750 mg/150 ml premix 750 mg 150 mL/hr   05/17/17 0513 New Bag/Given   vancomycin (VANCOCIN) IVPB 750 mg/150 ml premix 750 mg 150 mL/hr   05/17/17 0653 New Bag/Given   ceFEPIme (MAXIPIME) 2 g in dextrose 5 % 50 mL IVPB 2 g 100 mL/hr   05/17/17 1234 New Bag/Given   vancomycin (VANCOCIN) IVPB 750 mg/150 ml premix 750 mg 150 mL/hr   05/17/17 1348 New Bag/Given   ceFEPIme (MAXIPIME) 2 g in dextrose 5 % 50 mL IVPB 2 g 100 mL/hr   05/17/17 2025 New Bag/Given   vancomycin (VANCOCIN) IVPB 750 mg/150 ml premix 750 mg 150 mL/hr   05/17/17 2131 New Bag/Given   ceFEPIme (MAXIPIME) 2 g in dextrose 5 % 50 mL IVPB 2 g 100 mL/hr   05/18/17 0427 New Bag/Given   vancomycin (VANCOCIN) IVPB 750 mg/150 ml premix 750 mg 150 mL/hr   05/18/17 0525 New Bag/Given   ceFEPIme (MAXIPIME) 2 g in dextrose 5 % 50 mL IVPB 2 g 100 mL/hr   05/18/17 0934 New Bag/Given   piperacillin-tazobactam (ZOSYN) IVPB 3.375 g 3.375 g 12.5 mL/hr   05/18/17 1422 New Bag/Given   piperacillin-tazobactam (ZOSYN) IVPB 3.375 g 3.375 g 12.5 mL/hr   05/18/17 2145 New Bag/Given   piperacillin-tazobactam (ZOSYN) IVPB 3.375 g 3.375 g 12.5 mL/hr   05/19/17 0524 New Bag/Given   piperacillin-tazobactam (ZOSYN) IVPB 3.375 g 3.375 g 12.5 mL/hr   05/19/17 1519 New Bag/Given   piperacillin-tazobactam (ZOSYN) IVPB 3.375 g 3.375 g 12.5 mL/hr      . budesonide (PULMICORT) nebulizer solution  0.5 mg Nebulization BID  . chlorhexidine  15 mL Mouth/Throat BID  . erythromycin   Both Eyes Q6H  . feeding supplement (PRO-STAT SUGAR FREE 64)  30 mL Per Tube BID  . fentaNYL (SUBLIMAZE) injection  50 mcg Intravenous Once  . FLUoxetine  20 mg Per Tube Daily  . free water  150 mL Per Tube Q6H  . insulin aspart  0-15 Units Subcutaneous Q4H  . ipratropium-albuterol  3 mL Nebulization Q4H  . loratadine  10 mg Oral Daily  . mouth rinse  15 mL Mouth Rinse 10 times per day  . methylPREDNISolone (SOLU-MEDROL) injection  60 mg Intravenous Q12H  . senna-docusate  1 tablet Oral BID  . sodium chloride flush  10-40 mL Intracatheter Q12H    Objective: Vital signs in last 24 hours: Temp:  [98.2 F (36.8 C)-99.5 F (37.5 C)] 98.2 F (36.8 C) (09/26 1200) Pulse Rate:  [80-153] 126 (09/26 1535) Resp:  [16-33] 24 (09/26 1535) BP: (76-126)/(48-74) 97/71 (09/26 1535) SpO2:  [75 %-100 %] 94 % (09/26 1535) FiO2 (%):  [75 %-85 %] 85 % (09/26 1510) Physical Exam  Constitutional:  Intubated, sedated HENT: [place]/AT, PERRLA, no scleral icterus TLC L neck wnl Mouth/Throat: Oropharynx is  clear and moist. No oropharyngeal exudate.  Cardiovascular: Normal rate, regular rhythm and normal heart sounds. Exam reveals no gallop and no friction rub.  No murmur heard.  Pulmonary/Chest: mech  Breath sounds, rhonchi L  Neck = supple, no nuchal rigidity Abdominal: Soft. Bowel sounds are normal.  exhibits no distension. There is no tenderness.  Lymphadenopathy: no cervical adenopathy. No axillary adenopathy Neurological: sedated  Skin: Skin is warm and dry. No rash noted. No erythema.  Psychiatric: sedated   Lab Results  Recent Labs  05/18/17 1006 05/19/17 0508  WBC 17.8* 21.0*  HGB 12.7 11.8*  HCT 38.9 36.3  NA 146* 148*  K 3.4* 3.0*  CL 115* 120*  CO2 23 22  BUN 18 24*  CREATININE 0.73 0.84    Microbiology: Results for orders placed or performed  during the hospital encounter of 06/09/2017  Culture, blood (routine x 2)     Status: Abnormal   Collection Time: 2017-06-09  1:58 PM  Result Value Ref Range Status   Specimen Description BLOOD LT ARM  Final   Special Requests   Final    BOTTLES DRAWN AEROBIC AND ANAEROBIC Blood Culture results may not be optimal due to an inadequate volume of blood received in culture bottles   Culture  Setup Time   Final    GRAM POSITIVE COCCI ANAEROBIC BOTTLE ONLY CRITICAL RESULT CALLED TO, READ BACK BY AND VERIFIED WITH: LISA KLUTTZ AT 1025 05/17/17 SDR    Culture (A)  Final    STAPHYLOCOCCUS SPECIES (COAGULASE NEGATIVE) THE SIGNIFICANCE OF ISOLATING THIS ORGANISM FROM A SINGLE SET OF BLOOD CULTURES WHEN MULTIPLE SETS ARE DRAWN IS UNCERTAIN. PLEASE NOTIFY THE MICROBIOLOGY DEPARTMENT WITHIN ONE WEEK IF SPECIATION AND SENSITIVITIES ARE REQUIRED. Performed at [facility], [address] [place], [place] [address]    Report Status 05/19/2017 FINAL  Final  Blood Culture ID Panel (Reflexed)     Status: Abnormal   Collection Time: June 09, 2017  1:58 PM  Result Value Ref Range Status   Enterococcus species NOT DETECTED NOT DETECTED Final   Listeria monocytogenes NOT DETECTED NOT DETECTED Final   Staphylococcus species DETECTED (A) NOT DETECTED Final    Comment: Methicillin (oxacillin) resistant coagulase negative staphylococcus. Possible blood culture contaminant (unless isolated from more than one blood culture draw or clinical case suggests pathogenicity). No antibiotic treatment is indicated for blood  culture contaminants. CRITICAL RESULT CALLED TO, READ BACK BY AND VERIFIED WITH: LISA KLUTZ ON 05/17/17 AT 1025 SDR    Staphylococcus aureus NOT DETECTED NOT DETECTED Final   Methicillin resistance DETECTED (A) NOT DETECTED Final    Comment: CRITICAL RESULT CALLED TO, READ BACK BY AND VERIFIED WITH: LISA KLUTZ ON 05/17/17 AT 1025 SDR    Streptococcus species NOT DETECTED NOT DETECTED Final    Streptococcus agalactiae NOT DETECTED NOT DETECTED Final   Streptococcus pneumoniae NOT DETECTED NOT DETECTED Final   Streptococcus pyogenes NOT DETECTED NOT DETECTED Final   Acinetobacter baumannii NOT DETECTED NOT DETECTED Final   Enterobacteriaceae species NOT DETECTED NOT DETECTED Final   Enterobacter cloacae complex NOT DETECTED NOT DETECTED Final   Escherichia coli NOT DETECTED NOT DETECTED Final   Klebsiella oxytoca NOT DETECTED NOT DETECTED Final   Klebsiella pneumoniae NOT DETECTED NOT DETECTED Final   Proteus species NOT DETECTED NOT DETECTED Final   Serratia marcescens NOT DETECTED NOT DETECTED Final   Haemophilus influenzae NOT DETECTED NOT DETECTED Final   Neisseria meningitidis NOT DETECTED NOT DETECTED Final   Pseudomonas aeruginosa  NOT DETECTED NOT DETECTED Final   Candida albicans NOT DETECTED NOT DETECTED Final   Candida glabrata NOT DETECTED NOT DETECTED Final   Candida krusei NOT DETECTED NOT DETECTED Final   Candida parapsilosis NOT DETECTED NOT DETECTED Final   Candida tropicalis NOT DETECTED NOT DETECTED Final  Culture, blood (routine x 2)     Status: None (Preliminary result)   Collection Time: 06/06/2017  3:26 PM  Result Value Ref Range Status   Specimen Description BLOOD RT ARM  Final   Special Requests   Final    BOTTLES DRAWN AEROBIC AND ANAEROBIC Blood Culture results may not be optimal due to an inadequate volume of blood received in culture bottles   Culture NO GROWTH 3 DAYS  Final   Report Status PENDING  Incomplete  Urine culture     Status: None   Collection Time: 06-06-2017  4:57 PM  Result Value Ref Range Status   Specimen Description URINE, CLEAN CATCH  Final   Special Requests NONE  Final   Culture   Final    NO GROWTH Performed at [facility], [address], [place], [place] [address]    Report Status 05/18/2017 FINAL  Final  Culture, blood (routine x 2) Call MD if unable to obtain prior to antibiotics being given     Status: None  (Preliminary result)   Collection Time: 2017/06/06  5:54 PM  Result Value Ref Range Status   Specimen Description BLOOD LT WRIST  Final   Special Requests   Final    BOTTLES DRAWN AEROBIC AND ANAEROBIC Blood Culture adequate volume   Culture NO GROWTH 3 DAYS  Final   Report Status PENDING  Incomplete  MRSA PCR Screening     Status: None   Collection Time: 2017/06/06  6:35 PM  Result Value Ref Range Status   MRSA by PCR NEGATIVE NEGATIVE Final    Comment:        The GeneXpert MRSA Assay (FDA approved for NASAL specimens only), is one component of a comprehensive MRSA colonization surveillance program. It is not intended to diagnose MRSA infection nor to guide or monitor treatment for MRSA infections.   Culture, blood (routine x 2) Call MD if unable to obtain prior to antibiotics being given     Status: None (Preliminary result)   Collection Time: June 06, 2017  7:10 PM  Result Value Ref Range Status   Specimen Description BLOOD RIGHT WRIST  Final   Special Requests   Final    BOTTLES DRAWN AEROBIC AND ANAEROBIC Blood Culture results may not be optimal due to an excessive volume of blood received in culture bottles   Culture NO GROWTH 3 DAYS  Final   Report Status PENDING  Incomplete  Respiratory Panel by PCR     Status: None   Collection Time: 05/18/17  2:21 PM  Result Value Ref Range Status   Adenovirus NOT DETECTED NOT DETECTED Final   Coronavirus 229E NOT DETECTED NOT DETECTED Final   Coronavirus HKU1 NOT DETECTED NOT DETECTED Final   Coronavirus NL63 NOT DETECTED NOT DETECTED Final   Coronavirus OC43 NOT DETECTED NOT DETECTED Final   Metapneumovirus NOT DETECTED NOT DETECTED Final   Rhinovirus / Enterovirus NOT DETECTED NOT DETECTED Final   Influenza A NOT DETECTED NOT DETECTED Final   Influenza B NOT DETECTED NOT DETECTED Final   Parainfluenza Virus 1 NOT DETECTED NOT DETECTED Final   Parainfluenza Virus 2 NOT DETECTED NOT DETECTED Final   Parainfluenza Virus 3  NOT DETECTED  NOT DETECTED Final   Parainfluenza Virus 4 NOT DETECTED NOT DETECTED Final   Respiratory Syncytial Virus NOT DETECTED NOT DETECTED Final   Bordetella pertussis NOT DETECTED NOT DETECTED Final   Chlamydophila pneumoniae NOT DETECTED NOT DETECTED Final   Mycoplasma pneumoniae NOT DETECTED NOT DETECTED Final    Comment: Performed at [facility], [address], [place], [place] [address]  Culture, respiratory (NON-Expectorated)     Status: None (Preliminary result)   Collection Time: 05/18/17  2:21 PM  Result Value Ref Range Status   Specimen Description SPUTUM  Final   Special Requests NONE  Final   Gram Stain   Final    ABUNDANT WBC PRESENT, PREDOMINANTLY MONONUCLEAR ABUNDANT YEAST    Culture   Final    CULTURE REINCUBATED FOR BETTER GROWTH Performed at [facility], [address], [place], [place] [address]    Report Status PENDING  Incomplete  Culture, respiratory (NON-Expectorated)     Status: None (Preliminary result)   Collection Time: 05/19/17  1:40 AM  Result Value Ref Range Status   Specimen Description SPUTUM  Final   Special Requests NONE  Final   Gram Stain   Final    ABUNDANT WBC PRESENT, PREDOMINANTLY PMN MODERATE YEAST WITH PSEUDOHYPHAE Performed at [facility], [address] [place], [place] [address]    Culture PENDING  Incomplete   Report Status PENDING  Incomplete    Studies/Results: Ct Angio Chest Pe W Or Wo Contrast  Result Date: 05/18/2017 CLINICAL DATA:  Shortness of breath. EXAM: CT ANGIOGRAPHY CHEST WITH CONTRAST TECHNIQUE: Multidetector CT imaging of the chest was performed using the standard protocol during bolus administration of intravenous contrast. Multiplanar CT image reconstructions and MIPs were obtained to evaluate the vascular anatomy. CONTRAST:  75 mL of Isovue 370 intravenously. COMPARISON:  Radiographs of May 17, 2017. FINDINGS: Cardiovascular: Satisfactory opacification of the pulmonary arteries to the segmental  level. No evidence of pulmonary embolism. Normal heart size. No pericardial effusion. Mediastinum/Nodes: No enlarged mediastinal, hilar, or axillary lymph nodes. Thyroid gland, trachea, and esophagus demonstrate no significant findings. Lungs/Pleura: No pneumothorax is noted. Large left upper and lower lobe airspace opacities are noted most consistent with pneumonia. Smaller right upper and lower lobe opacities are also noted most consistent with pneumonia. Mild bilateral pleural effusions are noted. Upper Abdomen: No acute abnormality. Musculoskeletal: No chest wall abnormality. No acute or significant osseous findings. Review of the MIP images confirms the above findings. IMPRESSION: No definite evidence of pulmonary embolus. Bilateral upper and lower lobe airspace opacities are noted most consistent with pneumonia. Mild bilateral pleural effusions are noted. Electronically Signed   By: Lupita Raider, M.D.   On: 05/18/2017 12:10   Dg Chest Port 1 View  Result Date: 05/18/2017 CLINICAL DATA:  Central line placement EXAM: PORTABLE CHEST 1 VIEW COMPARISON:  05/17/2017 FINDINGS: Left IJ approach central line catheter tip is seen at the cavoatrial junction. No pneumothorax. The tip of an endotracheal tube is 3.1 cm above the carina. Gastric tube extends below the left hemidiaphragm into the expected location of the stomach. VP shunt tubing projects over the right hemithorax and extends into the abdomen. The tip is excluded on this study. Patchy pneumonic consolidations with air bronchograms are seen in both upper lobes, lingula and left lower lobe. Heart size is normal. The aorta is obscured by the adjacent pneumonia. IMPRESSION: 1. Satisfactory support line and tube positions. 2. Left IJ central line  catheter seen with tip at the cavoatrial junction. No pneumothorax. 3. Patchy multilobar pneumonia with air bronchograms. Electronically Signed   By: Tollie Eth M.D.   On: 05/18/2017 19:47   Dg Abd Portable  1v  Result Date: 05/18/2017 CLINICAL DATA:  OG tube placement EXAM: PORTABLE ABDOMEN - 1 VIEW COMPARISON:  None. FINDINGS: The tip and side port of a gastric tube are seen below the left hemidiaphragm in the expected location of the stomach. The tip however appears to coiled back toward the GE junction band is approximately 2 cm from the GE junction. Scattered gas containing small and large bowel loops without obstructive pattern. No free air. Contrast is seen within both renal collecting systems likely from recent administration of IV contrast. VP shunt tip projects over the left lower quadrant. Cholecystectomy clips are seen in the right upper quadrant. IMPRESSION: 1. Gastric tube tip and side-port are seen in the expected location of stomach however the tip projects back toward the GE junction and is seen approximately 2 cm from the expected location of the gastroesophageal juncture. 2. VP shunt tubing tip is seen in the left lower quadrant of the abdomen. Electronically Signed   By: Tollie Eth M.D.   On: 05/18/2017 19:44    Assessment/Plan: Lindsey Allison is a 41 y.o. female with multifocal PNA on CT chest. Possible aspiration.  CT shows severe dense infiltrate.  Had progressive decline, now intubated and in unit. CT with severe disease. Sputum cx pending but with yeast so far. MRSA PCR neg. Urine lg and urine strep PNA neg.  Resp PCR negative. I suspect severe aspiration  Rec Continue zosyn Await sputum cx  Cont supportive care  Thank you very much for the consult. Will follow with you.  Mick Sell   05/19/2017, 3:41 PM

## 2017-05-19 NOTE — Progress Notes (Signed)
Pharmacy Antibiotic Note  Lindsey Allison is a 41 y.o. female admitted on June 10, 2017 with aspiration pneumonia.  Pharmacy has been consulted for Zosyn dosing.   Plan: Continue Zosyn 3.375g q8h (4 hour infusion)  Height:  (167.6 cm) Weight: 161 lb (73 kg) IBW/kg (Calculated) : 59.3  Temp (24hrs), Avg:98.7 F (37.1 C), Min:98.2 F (36.8 C), Max:99.5 F (37.5 C)   Recent Labs Lab 05/14/17 1745 June 10, 2017 1311 06/10/2017 1358 06-10-17 1710 05/17/17 0307 05/18/17 1006 05/18/17 1208 05/18/17 1636 05/19/17 0508  WBC 14.0* 14.4*  --   --   --  17.8*  --   --  21.0*  CREATININE  --  0.72  --   --   --  0.73  --   --  0.84  LATICACIDVEN  --   --  4.1* 2.0* 2.4*  --   --  1.7  --   VANCOTROUGH  --   --   --   --   --   --  37*  --   --     Estimated Creatinine Clearance: 89.3 mL/min (by C-G formula based on SCr of 0.84 mg/dL).    Allergies  Allergen Reactions  . Belladonna Alkaloids Other (See Comments)    Unsure of reaction Unsure of reaction    Antimicrobials this admission: 9/23 Cefepime >> 9/25 9/23 Vancomycin >> 9/25 9/23 Zosyn >>   Dose adjustments this admission: N/A  Microbiology results: 9/23 BCx: no growth in 3 days  9/23 UCx: no growth  9/25 Sputum: pending 9/23 MRSA PCR: negative  Thank you for allowing pharmacy to be a part of this patient's care.  Dwain Sarna, PharmD Student 05/19/2017 11:00 AM

## 2017-05-20 ENCOUNTER — Ambulatory Visit: Payer: Medicaid Other | Admitting: Podiatry

## 2017-05-20 ENCOUNTER — Inpatient Hospital Stay: Payer: Medicaid Other

## 2017-05-20 LAB — CBC WITH DIFFERENTIAL/PLATELET
BASOS ABS: 0 10*3/uL (ref 0–0.1)
BASOS PCT: 0 %
Eosinophils Absolute: 0 10*3/uL (ref 0–0.7)
Eosinophils Relative: 0 %
HEMATOCRIT: 36.6 % (ref 35.0–47.0)
HEMOGLOBIN: 11.8 g/dL — AB (ref 12.0–16.0)
LYMPHS PCT: 2 %
Lymphs Abs: 0.4 10*3/uL — ABNORMAL LOW (ref 1.0–3.6)
MCH: 28.4 pg (ref 26.0–34.0)
MCHC: 32.3 g/dL (ref 32.0–36.0)
MCV: 87.9 fL (ref 80.0–100.0)
MONO ABS: 1.1 10*3/uL — AB (ref 0.2–0.9)
Monocytes Relative: 5 %
NEUTROS ABS: 20.6 10*3/uL — AB (ref 1.4–6.5)
NEUTROS PCT: 93 %
Platelets: 58 10*3/uL — ABNORMAL LOW (ref 150–440)
RBC: 4.17 MIL/uL (ref 3.80–5.20)
RDW: 16.4 % — AB (ref 11.5–14.5)
WBC: 22.1 10*3/uL — AB (ref 3.6–11.0)

## 2017-05-20 LAB — BLOOD GAS, ARTERIAL
ACID-BASE DEFICIT: 7.5 mmol/L — AB (ref 0.0–2.0)
BICARBONATE: 19.3 mmol/L — AB (ref 20.0–28.0)
FIO2: 1
MECHANICAL RATE: 16
O2 Saturation: 86.8 %
PEEP/CPAP: 12 cmH2O
Patient temperature: 37
VT: 500 mL
pCO2 arterial: 43 mmHg (ref 32.0–48.0)
pH, Arterial: 7.26 — ABNORMAL LOW (ref 7.350–7.450)
pO2, Arterial: 61 mmHg — ABNORMAL LOW (ref 83.0–108.0)

## 2017-05-20 LAB — BASIC METABOLIC PANEL
ANION GAP: 5 (ref 5–15)
BUN: 37 mg/dL — ABNORMAL HIGH (ref 6–20)
CALCIUM: 7.1 mg/dL — AB (ref 8.9–10.3)
CO2: 22 mmol/L (ref 22–32)
Chloride: 117 mmol/L — ABNORMAL HIGH (ref 101–111)
Creatinine, Ser: 1 mg/dL (ref 0.44–1.00)
GFR calc non Af Amer: >60 mL/min (ref >60)
Glucose, Bld: 164 mg/dL — ABNORMAL HIGH (ref 65–99)
POTASSIUM: 3.5 mmol/L (ref 3.5–5.1)
Sodium: 144 mmol/L (ref 135–145)

## 2017-05-20 LAB — GLUCOSE, CAPILLARY
GLUCOSE-CAPILLARY: 195 mg/dL — AB (ref 65–99)
GLUCOSE-CAPILLARY: 203 mg/dL — AB (ref 65–99)
GLUCOSE-CAPILLARY: 183 mg/dL — AB (ref 65–99)
Glucose-Capillary: 149 mg/dL — ABNORMAL HIGH (ref 65–99)
Glucose-Capillary: 165 mg/dL — ABNORMAL HIGH (ref 65–99)
Glucose-Capillary: 175 mg/dL — ABNORMAL HIGH (ref 65–99)

## 2017-05-20 LAB — PHOSPHORUS
PHOSPHORUS: 3.5 mg/dL (ref 2.5–4.6)
Phosphorus: 3.1 mg/dL (ref 2.5–4.6)

## 2017-05-20 LAB — PROCALCITONIN: PROCALCITONIN: 1.11 ng/mL

## 2017-05-20 MED ORDER — LACTATED RINGERS IV SOLN
INTRAVENOUS | Status: DC
  Administered 2017-05-20: 02:00:00 via INTRAVENOUS

## 2017-05-20 MED ORDER — LACTATED RINGERS IV SOLN
INTRAVENOUS | Status: DC
  Administered 2017-05-20: 17:00:00 via INTRAVENOUS

## 2017-05-20 MED ORDER — FUROSEMIDE 10 MG/ML IJ SOLN
40.0000 mg | Freq: Once | INTRAMUSCULAR | Status: AC
  Administered 2017-05-20: 40 mg via INTRAVENOUS
  Filled 2017-05-20: qty 4

## 2017-05-20 MED ORDER — DIPHENHYDRAMINE HCL 50 MG/ML IJ SOLN
12.5000 mg | Freq: Once | INTRAMUSCULAR | Status: AC
  Administered 2017-05-20: 12.5 mg via INTRAVENOUS
  Filled 2017-05-20: qty 1

## 2017-05-20 MED ORDER — DILTIAZEM HCL 100 MG IV SOLR
5.0000 mg/h | INTRAVENOUS | Status: DC
  Administered 2017-05-20: 5 mg/h via INTRAVENOUS
  Administered 2017-05-20: 7.5 mg/h via INTRAVENOUS
  Filled 2017-05-20 (×3): qty 100

## 2017-05-20 NOTE — Progress Notes (Signed)
OG appears to have migrated out about 10 cm following bath, although it is tightly taped to ETT and ETT is still in place at 23 cm, portable ABD ordered

## 2017-05-20 NOTE — Progress Notes (Signed)
ID E note  no fevers, wbc up some Remains intubated Sputum cx pending  REc  Cont zosyn

## 2017-05-20 NOTE — Progress Notes (Signed)
MEDICATION RELATED CONSULT NOTE   Pharmacy Consult for electrolyte monitoring  Indication: hypokalemia  Allergies  Allergen Reactions  . Belladonna Alkaloids Other (See Comments)    Unsure of reaction Unsure of reaction    Patient Measurements: Height:  (167.6 cm) Weight: 165 lb 12.6 oz (75.2 kg) IBW/kg (Calculated) : 59.3  Vital Signs: Temp: 98.4 F (36.9 C) (09/27 0800) Temp Source: Oral (09/27 0800) BP: 117/65 (09/27 1000) Pulse Rate: 105 (09/27 1000) Intake/Output from previous day: 09/26 0701 - 09/27 0700 In: 1528.1 [I.V.:356.1; NG/GT:922; IV Piggyback:250] Out: 545 [Urine:545] Intake/Output from this shift: Total I/O In: 1006.1 [I.V.:516.1; NG/GT:490] Out: 45 [Urine:45]  Labs:  Recent Labs  05/18/17 1006 05/19/17 0508 05/19/17 1821 05/20/17 0418  WBC 17.8* 21.0*  --  22.1*  HGB 12.7 11.8*  --  11.8*  HCT 38.9 36.3  --  36.6  PLT 79* 80*  --  58*  CREATININE 0.73 0.84  --  1.00  MG  --  2.2  --   --   PHOS  --  2.5 1.5* 3.5   Estimated Creatinine Clearance: 76 mL/min (by C-G formula based on SCr of 1 mg/dL).     Assessment: Lindsey Allison is a 47 YOF with a known history of myoclonic dystrophy, anxiety, GERD, hydrocephalus that presented to the ED with her care giver because she was not feeling well. Patient was found to have bilateral infiltrates and is being treated for aspiration pneumonia.  Plan:  Patient currently on lactated ringers infusion 75 mL/hr.  No electrolyte replacement warranted at this time.   Will continue to monitor with am labs.   Pharmacy will continue to follow per consult.   Dwain Sarna, PharmD Student 05/20/2017,11:03 AM

## 2017-05-20 NOTE — Progress Notes (Signed)
[employer] -  at [facility]                                                                                                                                                                                  Patient Demographics   Lindsey Allison, is a 41 y.o. female, DOB - [birth year], ZOX:[id number]  Admit date - 26-May-2017   Admitting Physician Ramonita Lab, MD  Outpatient Primary MD for the patient is Burns, Shellia Cleverly, MD   LOS - 4  Subjective: Patient was intubated, remains on the ventilator  Review of Systems:   CONSTITUTIONAL:limited Due to patient's illness  Vitals:   Vitals:   05/20/17 1000 05/20/17 1100 05/20/17 1118 05/20/17 1200  BP: 117/65 127/64  112/61  Pulse: (!) 105 (!) 106  (!) 115  Resp: (!) 34 (!) 35  (!) 32  Temp:    98.5 F (36.9 C)  TempSrc:    Oral  SpO2: 92% 92% 92% 94%  Weight:      Height:        Wt Readings from Last 3 Encounters:  05/20/17 165 lb 12.6 oz (75.2 kg)  04/04/17 142 lb (64.4 kg)  01/12/17 145 lb (65.8 kg)     Intake/Output Summary (Last 24 hours) at 05/20/17 1418 Last data filed at 05/20/17 1200  Gross per 24 hour  Intake          2548.89 ml  Output              515 ml  Net          2033.89 ml    Physical Exam:   GENERAL: intubated  HEAD, EYES, EARS, NOSE AND THROAT: Bilateral conjunctival erythema lip swelling NECK: Supple. There is no jugular venous distention. No bruits, no lymphadenopathy, no thyromegaly.  HEART: Regular rate and rhythm,. No murmurs, no rubs, no clicks.  LUNGS: decreased breath sounds bilaterally ABDOMEN: Soft, flat, nontender, nondistended. Has good bowel sounds. No hepatosplenomegaly appreciated.  EXTREMITIES: No evidence of any cyanosis, clubbing, or peripheral edema.  +2 pedal and radial pulses bilaterally.  NEUROLOGIC: intubated SKIN: Erythematous rash all over her chest And abdomen Psych: intubated LN: No inguinal LN enlargement    Antibiotics   Anti-infectives     Start     Dose/Rate Route Frequency Ordered Stop   05/18/17 0900  piperacillin-tazobactam (ZOSYN) IVPB 3.375 g     3.375 g 12.5 mL/hr over 240 Minutes Intravenous Every 8 hours 05/18/17 0851     05/17/17 0200  vancomycin (VANCOCIN) IVPB 750 mg/150 ml premix  Status:  Discontinued     750 mg 150 mL/hr over 60 Minutes Intravenous Every 8 hours May 26, 2017  1857 05/10/2017 1902   05/23/2017 2100  ceFEPIme (MAXIPIME) 2 g in dextrose 5 % 50 mL IVPB  Status:  Discontinued     2 g 100 mL/hr over 30 Minutes Intravenous Every 8 hours 05/12/2017 1740 05/18/17 0851   05/15/2017 2100  vancomycin (VANCOCIN) IVPB 750 mg/150 ml premix  Status:  Discontinued     750 mg 150 mL/hr over 60 Minutes Intravenous Every 8 hours 05/21/2017 1902 05/18/17 0851   05/12/2017 2000  vancomycin (VANCOCIN) IVPB 750 mg/150 ml premix  Status:  Discontinued     750 mg 150 mL/hr over 60 Minutes Intravenous  Once 04/29/2017 1856 05/15/2017 1859   05/06/2017 1416  piperacillin-tazobactam (ZOSYN) 3-0.375 GM/50ML IVPB    Comments:  Hatch, Shannin   : cabinet override      05/05/2017 1416 05/17/17 0229   05/10/2017 1400  vancomycin (VANCOCIN) IVPB 1000 mg/200 mL premix     1,000 mg 200 mL/hr over 60 Minutes Intravenous  Once 05/03/2017 1350 05/07/2017 1643   05/04/2017 1345  vancomycin (VANCOCIN) injection 1 g  Status:  Discontinued     1 g Intravenous  Once 04/30/2017 1345 04/26/2017 1350   05/23/2017 1345  piperacillin-tazobactam (ZOSYN) IVPB 3.375 g     3.375 g 100 mL/hr over 30 Minutes Intravenous  Once 05/12/2017 1345 05/01/2017 1504      Medications   Scheduled Meds: . budesonide (PULMICORT) nebulizer solution  0.5 mg Nebulization BID  . chlorhexidine  15 mL Mouth/Throat BID  . erythromycin   Both Eyes Q6H  . feeding supplement (PRO-STAT SUGAR FREE 64)  30 mL Per Tube BID  . fentaNYL (SUBLIMAZE) injection  50 mcg Intravenous Once  . FLUoxetine  20 mg Per Tube Daily  . free water  150 mL Per Tube Q6H  . insulin aspart  0-15 Units Subcutaneous Q4H  .  ipratropium-albuterol  3 mL Nebulization Q4H  . loratadine  10 mg Oral Daily  . mouth rinse  15 mL Mouth Rinse 10 times per day  . methylPREDNISolone (SOLU-MEDROL) injection  60 mg Intravenous Q12H  . senna-docusate  1 tablet Oral BID  . sodium chloride flush  10-40 mL Intracatheter Q12H   Continuous Infusions: . diltiazem (CARDIZEM) infusion 7.5 mg/hr (05/20/17 1200)  . famotidine (PEPCID) IV Stopped (05/20/17 1001)  . feeding supplement (VITAL AF 1.2 CAL) 1,000 mL (05/20/17 1200)  . fentaNYL infusion INTRAVENOUS 50 mcg/hr (05/20/17 1200)  . lactated ringers 75 mL/hr at 05/20/17 1200  . piperacillin-tazobactam (ZOSYN)  IV 3.375 g (05/20/17 1324)  . sodium chloride Stopped (05/18/17 1757)   PRN Meds:.acetaminophen, fentaNYL, midazolam, midazolam, sodium chloride, sodium chloride flush   Data Review:   Micro Results Recent Results (from the past 240 hour(s))  Rapid strep screen     Status: None   Collection Time: 05/14/17  5:40 PM  Result Value Ref Range Status   Streptococcus, Group A Screen (Direct) NEGATIVE NEGATIVE Final    Comment: (NOTE) A Rapid Antigen test may result negative if the antigen level in the sample is below the detection level of this test. The FDA has not cleared this test as a stand-alone test therefore the rapid antigen negative result has reflexed to a Group A Strep culture.   Culture, group A strep     Status: None   Collection Time: 05/14/17  5:40 PM  Result Value Ref Range Status   Specimen Description THROAT  Final   Special Requests NONE Reflexed from [id number]  Final   Culture  Final    NO GROUP A STREP (S.PYOGENES) ISOLATED Performed at [facility], [address], [place], [place] [address]    Report Status 05/17/2017 FINAL  Final  Culture, blood (routine x 2)     Status: Abnormal   Collection Time: 05/18/2017  1:58 PM  Result Value Ref Range Status   Specimen Description BLOOD LT ARM  Final   Special Requests   Final    BOTTLES DRAWN  AEROBIC AND ANAEROBIC Blood Culture results may not be optimal due to an inadequate volume of blood received in culture bottles   Culture  Setup Time   Final    GRAM POSITIVE COCCI ANAEROBIC BOTTLE ONLY CRITICAL RESULT CALLED TO, READ BACK BY AND VERIFIED WITH: LISA KLUTTZ AT 1025 05/17/17 SDR    Culture (A)  Final    STAPHYLOCOCCUS SPECIES (COAGULASE NEGATIVE) THE SIGNIFICANCE OF ISOLATING THIS ORGANISM FROM A SINGLE SET OF BLOOD CULTURES WHEN MULTIPLE SETS ARE DRAWN IS UNCERTAIN. PLEASE NOTIFY THE MICROBIOLOGY DEPARTMENT WITHIN ONE WEEK IF SPECIATION AND SENSITIVITIES ARE REQUIRED. Performed at [facility], [address] [place], [place] [address]    Report Status 05/19/2017 FINAL  Final  Blood Culture ID Panel (Reflexed)     Status: Abnormal   Collection Time: 05/10/2017  1:58 PM  Result Value Ref Range Status   Enterococcus species NOT DETECTED NOT DETECTED Final   Listeria monocytogenes NOT DETECTED NOT DETECTED Final   Staphylococcus species DETECTED (A) NOT DETECTED Final    Comment: Methicillin (oxacillin) resistant coagulase negative staphylococcus. Possible blood culture contaminant (unless isolated from more than one blood culture draw or clinical case suggests pathogenicity). No antibiotic treatment is indicated for blood  culture contaminants. CRITICAL RESULT CALLED TO, READ BACK BY AND VERIFIED WITH: LISA KLUTZ ON 05/17/17 AT 1025 SDR    Staphylococcus aureus NOT DETECTED NOT DETECTED Final   Methicillin resistance DETECTED (A) NOT DETECTED Final    Comment: CRITICAL RESULT CALLED TO, READ BACK BY AND VERIFIED WITH: LISA KLUTZ ON 05/17/17 AT 1025 SDR    Streptococcus species NOT DETECTED NOT DETECTED Final   Streptococcus agalactiae NOT DETECTED NOT DETECTED Final   Streptococcus pneumoniae NOT DETECTED NOT DETECTED Final   Streptococcus pyogenes NOT DETECTED NOT DETECTED Final   Acinetobacter baumannii NOT DETECTED NOT DETECTED Final   Enterobacteriaceae species  NOT DETECTED NOT DETECTED Final   Enterobacter cloacae complex NOT DETECTED NOT DETECTED Final   Escherichia coli NOT DETECTED NOT DETECTED Final   Klebsiella oxytoca NOT DETECTED NOT DETECTED Final   Klebsiella pneumoniae NOT DETECTED NOT DETECTED Final   Proteus species NOT DETECTED NOT DETECTED Final   Serratia marcescens NOT DETECTED NOT DETECTED Final   Haemophilus influenzae NOT DETECTED NOT DETECTED Final   Neisseria meningitidis NOT DETECTED NOT DETECTED Final   Pseudomonas aeruginosa NOT DETECTED NOT DETECTED Final   Candida albicans NOT DETECTED NOT DETECTED Final   Candida glabrata NOT DETECTED NOT DETECTED Final   Candida krusei NOT DETECTED NOT DETECTED Final   Candida parapsilosis NOT DETECTED NOT DETECTED Final   Candida tropicalis NOT DETECTED NOT DETECTED Final  Culture, blood (routine x 2)     Status: None (Preliminary result)   Collection Time: 04/30/2017  3:26 PM  Result Value Ref Range Status   Specimen Description BLOOD RT ARM  Final   Special Requests   Final    BOTTLES DRAWN AEROBIC AND ANAEROBIC Blood Culture results may not be optimal due to an inadequate volume  of blood received in culture bottles   Culture NO GROWTH 4 DAYS  Final   Report Status PENDING  Incomplete  Urine culture     Status: None   Collection Time: 05/06/2017  4:57 PM  Result Value Ref Range Status   Specimen Description URINE, CLEAN CATCH  Final   Special Requests NONE  Final   Culture   Final    NO GROWTH Performed at [facility], [address], [place], [place] [address]    Report Status 05/18/2017 FINAL  Final  Culture, blood (routine x 2) Call MD if unable to obtain prior to antibiotics being given     Status: None (Preliminary result)   Collection Time: 04/24/2017  5:54 PM  Result Value Ref Range Status   Specimen Description BLOOD LT WRIST  Final   Special Requests   Final    BOTTLES DRAWN AEROBIC AND ANAEROBIC Blood Culture adequate volume   Culture NO GROWTH 4 DAYS  Final    Report Status PENDING  Incomplete  MRSA PCR Screening     Status: None   Collection Time: 05/09/2017  6:35 PM  Result Value Ref Range Status   MRSA by PCR NEGATIVE NEGATIVE Final    Comment:        The GeneXpert MRSA Assay (FDA approved for NASAL specimens only), is one component of a comprehensive MRSA colonization surveillance program. It is not intended to diagnose MRSA infection nor to guide or monitor treatment for MRSA infections.   Culture, blood (routine x 2) Call MD if unable to obtain prior to antibiotics being given     Status: None (Preliminary result)   Collection Time: 04/28/2017  7:10 PM  Result Value Ref Range Status   Specimen Description BLOOD RIGHT WRIST  Final   Special Requests   Final    BOTTLES DRAWN AEROBIC AND ANAEROBIC Blood Culture results may not be optimal due to an excessive volume of blood received in culture bottles   Culture NO GROWTH 4 DAYS  Final   Report Status PENDING  Incomplete  Respiratory Panel by PCR     Status: None   Collection Time: 05/18/17  2:21 PM  Result Value Ref Range Status   Adenovirus NOT DETECTED NOT DETECTED Final   Coronavirus 229E NOT DETECTED NOT DETECTED Final   Coronavirus HKU1 NOT DETECTED NOT DETECTED Final   Coronavirus NL63 NOT DETECTED NOT DETECTED Final   Coronavirus OC43 NOT DETECTED NOT DETECTED Final   Metapneumovirus NOT DETECTED NOT DETECTED Final   Rhinovirus / Enterovirus NOT DETECTED NOT DETECTED Final   Influenza A NOT DETECTED NOT DETECTED Final   Influenza B NOT DETECTED NOT DETECTED Final   Parainfluenza Virus 1 NOT DETECTED NOT DETECTED Final   Parainfluenza Virus 2 NOT DETECTED NOT DETECTED Final   Parainfluenza Virus 3 NOT DETECTED NOT DETECTED Final   Parainfluenza Virus 4 NOT DETECTED NOT DETECTED Final   Respiratory Syncytial Virus NOT DETECTED NOT DETECTED Final   Bordetella pertussis NOT DETECTED NOT DETECTED Final   Chlamydophila pneumoniae NOT DETECTED NOT DETECTED Final   Mycoplasma  pneumoniae NOT DETECTED NOT DETECTED Final    Comment: Performed at [facility], [address] [place], [place] [address]  Culture, respiratory (NON-Expectorated)     Status: None (Preliminary result)   Collection Time: 05/18/17  2:21 PM  Result Value Ref Range Status   Specimen Description SPUTUM  Final   Special Requests NONE  Final   Gram Stain  Final    ABUNDANT WBC PRESENT, PREDOMINANTLY MONONUCLEAR ABUNDANT YEAST    Culture   Final    MODERATE YEAST IDENTIFICATION TO FOLLOW Performed at [facility], [address] [place], [place] [address]    Report Status PENDING  Incomplete  Culture, respiratory (NON-Expectorated)     Status: None (Preliminary result)   Collection Time: 05/19/17  1:40 AM  Result Value Ref Range Status   Specimen Description SPUTUM  Final   Special Requests NONE  Final   Gram Stain   Final    ABUNDANT WBC PRESENT, PREDOMINANTLY PMN MODERATE YEAST WITH PSEUDOHYPHAE    Culture   Final    CULTURE REINCUBATED FOR BETTER GROWTH Performed at [facility], [address] [place], [place] [address]    Report Status PENDING  Incomplete    Radiology Reports Dg Chest 2 View  Result Date: 05/10/2017 CLINICAL DATA:  Cough and lethargy EXAM: CHEST  2 VIEW COMPARISON:  01/12/2017 FINDINGS: Cardiac shadow is within normal limits. The lungs are well aerated bilaterally. Patchy infiltrative changes are seen in the left mid and lower lung as well as the right lung base. No sizable effusion is seen. No bony abnormality is noted. Ventricular shunt is noted on the right. IMPRESSION: Bilateral infiltrates as described. Electronically Signed   By: Alcide Clever M.D.   On: 05/11/2017 12:37   Ct Angio Chest Pe W Or Wo Contrast  Result Date: 05/18/2017 CLINICAL DATA:  Shortness of breath. EXAM: CT ANGIOGRAPHY CHEST WITH CONTRAST TECHNIQUE: Multidetector CT imaging of the chest was performed using the standard protocol during bolus administration of  intravenous contrast. Multiplanar CT image reconstructions and MIPs were obtained to evaluate the vascular anatomy. CONTRAST:  75 mL of Isovue 370 intravenously. COMPARISON:  Radiographs of May 17, 2017. FINDINGS: Cardiovascular: Satisfactory opacification of the pulmonary arteries to the segmental level. No evidence of pulmonary embolism. Normal heart size. No pericardial effusion. Mediastinum/Nodes: No enlarged mediastinal, hilar, or axillary lymph nodes. Thyroid gland, trachea, and esophagus demonstrate no significant findings. Lungs/Pleura: No pneumothorax is noted. Large left upper and lower lobe airspace opacities are noted most consistent with pneumonia. Smaller right upper and lower lobe opacities are also noted most consistent with pneumonia. Mild bilateral pleural effusions are noted. Upper Abdomen: No acute abnormality. Musculoskeletal: No chest wall abnormality. No acute or significant osseous findings. Review of the MIP images confirms the above findings. IMPRESSION: No definite evidence of pulmonary embolus. Bilateral upper and lower lobe airspace opacities are noted most consistent with pneumonia. Mild bilateral pleural effusions are noted. Electronically Signed   By: Lupita Raider, M.D.   On: 05/18/2017 12:10   Dg Chest Port 1 View  Result Date: 05/20/2017 CLINICAL DATA:  Respiratory failure. EXAM: PORTABLE CHEST 1 VIEW COMPARISON:  05/19/2017. FINDINGS: Endotracheal tube, NG tube, left IJ line stable position. Heart size normal. Diffuse bilateral pulmonary infiltrates/edema again noted. No interim change. Small left pleural effusion cannot be excluded. No pneumothorax. IMPRESSION: 1. Lines and tubes in stable position. 2. Diffuse bilateral pulmonary infiltrates/edema again noted. No interim change. Small left pleural effusion cannot be excluded. Electronically Signed   By: Maisie Fus  Register   On: 05/20/2017 06:34   Dg Chest Port 1 View  Result Date: 05/19/2017 CLINICAL DATA:  The  EXAM: PORTABLE CHEST 1 VIEW COMPARISON:  Prior radiograph from 05/18/2017. FINDINGS: Endotracheal tube in place with tip positioned 19 mm above the carina. Enteric tube courses in the abdomen. Left IJ approach centra  venous catheter in place with tip in the proximal right atrium, stable. Cardiac and mediastinal silhouettes are unchanged. Patchy multifocal opacities with consolidation and air bronchograms again seen involving the bilateral lungs, left greater than right. There is increased opacity at the left lung base as compared to previous, with some improved aeration at the left upper lobe. Right lung relatively similar in appearance. No pleural effusion. No appreciable pneumothorax. Osseous structures unchanged. IMPRESSION: 1. Support apparatus in satisfactory position as above. 2. Persistent patchy multifocal airspace opacities, left greater than right, concerning for multilobar pneumonia. As compared to previous, there is worsened opacity at the left lung base, with slightly improved aeration at the left upper lung. Electronically Signed   By: Rise Mu M.D.   On: 05/19/2017 15:54   Dg Chest Port 1 View  Result Date: 05/18/2017 CLINICAL DATA:  Central line placement EXAM: PORTABLE CHEST 1 VIEW COMPARISON:  05/17/2017 FINDINGS: Left IJ approach central line catheter tip is seen at the cavoatrial junction. No pneumothorax. The tip of an endotracheal tube is 3.1 cm above the carina. Gastric tube extends below the left hemidiaphragm into the expected location of the stomach. VP shunt tubing projects over the right hemithorax and extends into the abdomen. The tip is excluded on this study. Patchy pneumonic consolidations with air bronchograms are seen in both upper lobes, lingula and left lower lobe. Heart size is normal. The aorta is obscured by the adjacent pneumonia. IMPRESSION: 1. Satisfactory support line and tube positions. 2. Left IJ central line catheter seen with tip at the cavoatrial  junction. No pneumothorax. 3. Patchy multilobar pneumonia with air bronchograms. Electronically Signed   By: Tollie Eth M.D.   On: 05/18/2017 19:47   Dg Chest Port 1 View  Result Date: 05/17/2017 CLINICAL DATA:  Cough EXAM: PORTABLE CHEST 1 VIEW COMPARISON:  Chest radiograph 06/07/2017 at 12:07 p.m. FINDINGS: Marked worsening of consolidative opacity in the left mid lung compared to the earlier study. Aeration at the right lung base is improved. No pneumothorax or pleural effusion. IMPRESSION: Marked worsening of left mid lung consolidation, likely infection or aspiration. Electronically Signed   By: Deatra Robinson M.D.   On: 05/17/2017 02:18   Dg Abd Portable 1v  Result Date: 05/18/2017 CLINICAL DATA:  OG tube placement EXAM: PORTABLE ABDOMEN - 1 VIEW COMPARISON:  None. FINDINGS: The tip and side port of a gastric tube are seen below the left hemidiaphragm in the expected location of the stomach. The tip however appears to coiled back toward the GE junction band is approximately 2 cm from the GE junction. Scattered gas containing small and large bowel loops without obstructive pattern. No free air. Contrast is seen within both renal collecting systems likely from recent administration of IV contrast. VP shunt tip projects over the left lower quadrant. Cholecystectomy clips are seen in the right upper quadrant. IMPRESSION: 1. Gastric tube tip and side-port are seen in the expected location of stomach however the tip projects back toward the GE junction and is seen approximately 2 cm from the expected location of the gastroesophageal juncture. 2. VP shunt tubing tip is seen in the left lower quadrant of the abdomen. Electronically Signed   By: Tollie Eth M.D.   On: 05/18/2017 19:44     CBC  Recent Labs Lab 05/14/17 1745 Jun 07, 2017 1311 05/18/17 1006 05/19/17 0508 05/20/17 0418  WBC 14.0* 14.4* 17.8* 21.0* 22.1*  HGB 13.3 13.5 12.7 11.8* 11.8*  HCT 40.2 41.2 38.9 36.3 36.6  PLT  121* 98* 79* 80*  58*  MCV 88.7 88.1 87.8 88.0 87.9  MCH 29.4 28.8 28.7 28.5 28.4  MCHC 33.1 32.7 32.7 32.4 32.3  RDW 14.9* 15.3* 15.5* 15.7* 16.4*  LYMPHSABS 1.0 1.1 1.0  --  0.4*  MONOABS 0.7 0.4 0.8  --  1.1*  EOSABS 0.2 0.1 0.0  --  0.0  BASOSABS 0.1 0.0 0.1  --  0.0    Chemistries   Recent Labs Lab 2017-06-09 1311 05/18/17 1006 05/19/17 0508 05/19/17 1821 05/20/17 0418  NA 138 146* 148*  --  144  K 4.0 3.4* 3.0* 3.5 3.5  CL 102 115* 120*  --  117*  CO2 --  22  GLUCOSE 97 128* 183*  --  164*  BUN 16 18 24*  --  37*  CREATININE 0.72 0.73 0.84  --  1.00  CALCIUM 8.6* 8.1* 7.9*  --  7.1*  MG  --   --  2.2  --   --   AST 58*  --   --   --   --   ALT 61*  --   --   --   --   ALKPHOS 124  --   --   --   --   BILITOT 2.0*  --   --   --   --    ------------------------------------------------------------------------------------------------------------------ estimated creatinine clearance is 76 mL/min (by C-G formula based on SCr of 1 mg/dL). ------------------------------------------------------------------------------------------------------------------ No results for input(s): HGBA1C in the last 72 hours. ------------------------------------------------------------------------------------------------------------------ No results for input(s): CHOL, HDL, LDLCALC, TRIG, CHOLHDL, LDLDIRECT in the last 72 hours. ------------------------------------------------------------------------------------------------------------------  Recent Labs  05/19/17 1821  TSH 0.438   ------------------------------------------------------------------------------------------------------------------ No results for input(s): VITAMINB12, FOLATE, FERRITIN, TIBC, IRON, RETICCTPCT in the last 72 hours.  Coagulation profile No results for input(s): INR, PROTIME in the last 168 hours.  No results for input(s): DDIMER in the last 72 hours.  Cardiac Enzymes  Recent Labs Lab 2017/06/09 1922 05/17/17 0042  05/19/17 1355  TROPONINI 0.03* 0.03* 0.07*   ------------------------------------------------------------------------------------------------------------------ Invalid input(s): POCBNP    Assessment & Plan  Lindsey Allison  is a 41 y.o. female with a known history of myoclonic dystrophy, anxiety, GERD,hydrocephalus,poor historian, is brought into the ED by her care giver as patient is not feeling well Patient was seen at urgent care on Friday, throat cultures were obtained and patient was given a prescription for azithromycin for possible strep throat,. Both conjunctiva are red and patient is brought into the  Emergency department.Chest x-ray has revealed bilateral infiltrates  #Acute respiratory failure Due to bilateral consolidation Continue IV antibiotics  await sputum culture Ventilator support for now Continue Zosyn ith her myotonic dystrophy prognosis is not very good  # sepsis from aspiration pneumonia  Continue therapy with IV Zosyn Positive blood cultures likely contamination  #atrial fib with rvr continue IV Cardizem drip  # erythematous rash Solumedrol for likely allergic rash  #Bilateral conjunctivitis continue erythromycin ointment some improvement   #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           Consults   none   DVT Prophylaxis  Lovenox    Lab Results  Component Value Date   PLT 58 (L) 05/20/2017     Time Spent in minutes   35  minutes spent  Auburn Bilberry M.D on 05/20/2017 at 2:18 PM  Between 7am to 6pm - Pager - [phone]  After 6pm go to [email or web address] - password [email or web address] [facility]  [facility]   Office  [phone]

## 2017-05-20 NOTE — Progress Notes (Signed)
Sao2 98 % on 100% Fio2 +15Peep. Fio2 now decreased Fio2 to 90% will continue to monitor closely and wean Fio2 as appropriate

## 2017-05-20 NOTE — Progress Notes (Signed)
Sao2 99% Fio2 80%, Fio2 now 70%.

## 2017-05-20 NOTE — Progress Notes (Signed)
Sao2 97% on 90% fio2, decreased Fio2 80% will continue to monitor.

## 2017-05-20 NOTE — Progress Notes (Signed)
Notified Elink of decreasing urine output.

## 2017-05-20 NOTE — Progress Notes (Signed)
[place] [facility]  PATIENT NAME: Lindsey Allison    HISTORY OF PRESENT ILLNESS:  41 yo female from a group home who is a ward of state with hx of muscular dystrophy and hydrocephalus admitted to [facility] unit on 09/23 with bilateral pneumonia requiring transfer to ICU 09/25 due to worsening acute respiratory failure requiring mechanical intubation.    The patient developed A. fib with RVR overnight, she received multiple boluses of amiodarone if no improvement, the patient was then started on Cardizem infusion with improvement.  REVIEW OF SYSTEMS:  Unobtainable due to critical illness  VITAL SIGNS:  Blood pressure 111/60, pulse (!) 105, temperature 98.4 F (36.9 C), temperature source Oral, resp. rate (!) 29, height _0  (1.676 m), weight 165 lb 12.6 oz (75.2 kg), SpO2 (!) 88 %.  PHYSICAL EXAMINATION:  GENERAL: acutely ill appearing Caucasian female, NAD mechanically intubated HEENT: supple, no JVD LUNGS: rhonchi throughout, even, non labored; no rales, crackles, or wheezes  CARDIOVASCULAR: s1s2, rrr, no M/R/G ABDOMEN: +BS x4, soft, non tender, non distended  EXTREMITIES: 2+ pitting edema bilateral lower extremities  NEUROLOGIC: follows commands, PERRL  SKIN: No obvious rash, lesion, or ulcer.   LABORATORY PANEL:   CBC  Recent Labs Lab 05/20/17 0418  WBC 22.1*  HGB 11.8*  HCT 36.6  PLT 58*   ------------------------------------------------------------------------------------------------------------------  Chemistries   Recent Labs Lab 04/25/2017 1311  05/19/17 0508  05/20/17 0418  NA 138  < > 148*  --  144  K 4.0  < > 3.0*  < > 3.5  CL 102  < > 120*  --  117*  CO2 25  < > 22  --  22  GLUCOSE 97  < > 183*  --  164*  BUN 16  < > 24*  --  37*  CREATININE 0.72  < > 0.84  --  1.00  CALCIUM 8.6*  < > 7.9*  --  7.1*  MG  --   --  2.2  --   --   AST 58*  --   --   --   --   ALT 61*  --   --   --   --   ALKPHOS 124  --   --   --   --   BILITOT 2.0*  --    --   --   --   < > = values in this interval not displayed. ------------------------------------------------------------------------------------------------------------------  Cardiac Enzymes  Recent Labs Lab 05/19/17 1355  TROPONINI 0.07*   ------------------------------------------------------------------------------------------------------------------  RADIOLOGY:  Ct Angio Chest Pe W Or Wo Contrast  Result Date: 05/18/2017 CLINICAL DATA:  Shortness of breath. EXAM: CT ANGIOGRAPHY CHEST WITH CONTRAST TECHNIQUE: Multidetector CT imaging of the chest was performed using the standard protocol during bolus administration of intravenous contrast. Multiplanar CT image reconstructions and MIPs were obtained to evaluate the vascular anatomy. CONTRAST:  75 mL of Isovue 370 intravenously. COMPARISON:  Radiographs of May 17, 2017. FINDINGS: Cardiovascular: Satisfactory opacification of the pulmonary arteries to the segmental level. No evidence of pulmonary embolism. Normal heart size. No pericardial effusion. Mediastinum/Nodes: No enlarged mediastinal, hilar, or axillary lymph nodes. Thyroid gland, trachea, and esophagus demonstrate no significant findings. Lungs/Pleura: No pneumothorax is noted. Large left upper and lower lobe airspace opacities are noted most consistent with pneumonia. Smaller right upper and lower lobe opacities are also noted most consistent with pneumonia. Mild bilateral pleural effusions are noted. Upper Abdomen: No acute abnormality. Musculoskeletal: No chest wall abnormality. No acute  or significant osseous findings. Review of the MIP images confirms the above findings. IMPRESSION: No definite evidence of pulmonary embolus. Bilateral upper and lower lobe airspace opacities are noted most consistent with pneumonia. Mild bilateral pleural effusions are noted. Electronically Signed   By: Marijo Conception, M.D.   On: 05/18/2017 12:10   Dg Chest Port 1 View  Result Date:  05/20/2017 CLINICAL DATA:  Respiratory failure. EXAM: PORTABLE CHEST 1 VIEW COMPARISON:  05/19/2017. FINDINGS: Endotracheal tube, NG tube, left IJ line stable position. Heart size normal. Diffuse bilateral pulmonary infiltrates/edema again noted. No interim change. Small left pleural effusion cannot be excluded. No pneumothorax. IMPRESSION: 1. Lines and tubes in stable position. 2. Diffuse bilateral pulmonary infiltrates/edema again noted. No interim change. Small left pleural effusion cannot be excluded. Electronically Signed   By: Marcello Moores  Register   On: 05/20/2017 06:34   Dg Chest Port 1 View  Result Date: 05/19/2017 CLINICAL DATA:  The EXAM: PORTABLE CHEST 1 VIEW COMPARISON:  Prior radiograph from 05/18/2017. FINDINGS: Endotracheal tube in place with tip positioned 19 mm above the carina. Enteric tube courses in the abdomen. Left IJ approach centra venous catheter in place with tip in the proximal right atrium, stable. Cardiac and mediastinal silhouettes are unchanged. Patchy multifocal opacities with consolidation and air bronchograms again seen involving the bilateral lungs, left greater than right. There is increased opacity at the left lung base as compared to previous, with some improved aeration at the left upper lobe. Right lung relatively similar in appearance. No pleural effusion. No appreciable pneumothorax. Osseous structures unchanged. IMPRESSION: 1. Support apparatus in satisfactory position as above. 2. Persistent patchy multifocal airspace opacities, left greater than right, concerning for multilobar pneumonia. As compared to previous, there is worsened opacity at the left lung base, with slightly improved aeration at the left upper lung. Electronically Signed   By: Jeannine Boga M.D.   On: 05/19/2017 15:54   Dg Chest Port 1 View  Result Date: 05/18/2017 CLINICAL DATA:  Central line placement EXAM: PORTABLE CHEST 1 VIEW COMPARISON:  05/17/2017 FINDINGS: Left IJ approach central line  catheter tip is seen at the cavoatrial junction. No pneumothorax. The tip of an endotracheal tube is 3.1 cm above the carina. Gastric tube extends below the left hemidiaphragm into the expected location of the stomach. VP shunt tubing projects over the right hemithorax and extends into the abdomen. The tip is excluded on this study. Patchy pneumonic consolidations with air bronchograms are seen in both upper lobes, lingula and left lower lobe. Heart size is normal. The aorta is obscured by the adjacent pneumonia. IMPRESSION: 1. Satisfactory support line and tube positions. 2. Left IJ central line catheter seen with tip at the cavoatrial junction. No pneumothorax. 3. Patchy multilobar pneumonia with air bronchograms. Electronically Signed   By: Ashley Royalty M.D.   On: 05/18/2017 19:47   Dg Abd Portable 1v  Result Date: 05/18/2017 CLINICAL DATA:  OG tube placement EXAM: PORTABLE ABDOMEN - 1 VIEW COMPARISON:  None. FINDINGS: The tip and side port of a gastric tube are seen below the left hemidiaphragm in the expected location of the stomach. The tip however appears to coiled back toward the GE junction band is approximately 2 cm from the GE junction. Scattered gas containing small and large bowel loops without obstructive pattern. No free air. Contrast is seen within both renal collecting systems likely from recent administration of IV contrast. VP shunt tip projects over the left lower quadrant. Cholecystectomy clips are seen  in the right upper quadrant. IMPRESSION: 1. Gastric tube tip and side-port are seen in the expected location of stomach however the tip projects back toward the GE junction and is seen approximately 2 cm from the expected location of the gastroesophageal juncture. 2. VP shunt tubing tip is seen in the left lower quadrant of the abdomen. Electronically Signed   By: Ashley Royalty M.D.   On: 05/18/2017 19:44   I personally reviewed, images, review chest x-ray shows continued diffuse bilateral  infiltrates, worse on the left than the right.  EKG:   Orders placed or performed during the hospital encounter of 04/30/2017  . EKG 12-Lead  . EKG 12-Lead  . EKG 12-Lead     ASSESSMENT/PLAN: Acute hypoxic respiratory failure severe bilateral pneumonia with possible ARDS  Septic shock with hypotension-resolved  Developed Afib with RVR yesterday, now rate controlled on cardizem.  Lactic Acidosis-resolved  Continued Leukocytosis Thrombocytopenia  Hyperglycemia - mild.  P: Continue full vent support, patient is still requiring a high degree of support from the ventilator. She is not yet stable enough for spontaneous breathing trial. SBT daily once parameters met Maintain O2 sats >92% Continue scheduled bronchodilator therapy Continue nebulized and IV steroids  Resp cultures shows yeast, otherwise pending.  VAP bundle Maintain RASS goal 0 to -1 ID and neurology consulted appreciate input SCD's for VTE prophylaxis, no chemical prophylaxis for now Monitor for s/sx of bleeding Trend CBC Transfuse for hgb <7 Continue SSI    Deep Ashby Dawes, MD.   Board Certified in Internal Medicine, Pulmonary Medicine, [place], and Sleep Medicine.  [facility] Office Number: [phone] Pager: [phone]  Patricia Pesa, M.D.  Merton Border, M.D   [facility].  I have personally obtained a history, examined the patient, evaluated laboratory and imaging results, formulated the assessment and plan and placed orders. The Patient requires high complexity decision making for assessment and support, frequent evaluation and titration of therapies, application of advanced monitoring technologies and extensive interpretation of multiple databases. The patient has critical illness that could lead imminently to failure of 1 or more organ systems and requires the highest level of physician preparedness to intervene.  Critical Care Time devoted to  patient care services described in this note is 37 minutes and is exclusive of time spent in procedures supervisory time of NP.

## 2017-05-20 NOTE — Progress Notes (Signed)
Sao2 97% on 70% +15 Peep, decreased fIO2 60%

## 2017-05-20 NOTE — Progress Notes (Signed)
eLink Physician-Brief Progress Note Patient Name: Lindsey Allison [facility] DOB: 08/13/1976 MRN: [id number]   Date of Service  05/20/2017  HPI/Events of Note  Oliguria.  ARDS.  Positive 11.5 liters since admission.   eICU Interventions  Decrease IV fluid rate and give lasix 40 mg IV x one.      Intervention Category Major Interventions: Other:  Rosan Calbert 05/20/2017, 9:49 PM

## 2017-05-20 NOTE — Progress Notes (Signed)
Inpatient Diabetes Program Recommendations  AACE/ADA: New Consensus Statement on Inpatient Glycemic Control (2015)  Target Ranges:  Prepandial:   less than 140 mg/dL      Peak postprandial:   less than 180 mg/dL (1-2 hours)      Critically ill patients:  140 - 180 mg/dL   Lab Results  Component Value Date   GLUCAP 203 (H) 05/20/2017    Review of Glycemic Control  Results for IVIONNA, VERLEY (MRN [id number]) as of 05/20/2017 13:21  Ref. Range 05/19/2017 16:30 05/19/2017 20:07 05/20/2017 04:09 05/20/2017 07:51 05/20/2017 11:49  Glucose-Capillary Latest Ref Range: 65 - 99 mg/dL 034 (H) 742 (H) 595 (H) 195 (H) 203 (H)    Diabetes history: none noted Outpatient Diabetes medications: none Current orders for Inpatient glycemic control: Novolog 0-15 units q4h   Inpatient Diabetes Program Recommendations:   If there is one CBG > 250 mg/dL or two subsequent CBG > 200 mg/dL consider putting the patient on ICU Glycemic control order set, phase 2 a. Search for ICU Glycemic Control Order Set (Phase 2) in Order Management under Order Sets and place orders per protocol, cosign required. In Order Management select Active Orders and View by Order Set.   Susette Racer, RN, BA, MHA, CDE Diabetes Coordinator Inpatient Diabetes Program  [phone] (Team Pager) [phone] [facility] Office) 05/20/2017 1:25 PM

## 2017-05-20 NOTE — Progress Notes (Signed)
Pharmacy Antibiotic Note  Lindsey Allison is a 41 y.o. female admitted on 04/25/2017 with aspiration pneumonia.  Pharmacy has been consulted for Zosyn dosing.   Plan: Continue Zosyn 3.375g q8h (4 hour infusion)  Height:  (167.6 cm) Weight: 165 lb 12.6 oz (75.2 kg) IBW/kg (Calculated) : 59.3  Temp (24hrs), Avg:98.3 F (36.8 C), Min:98.1 F (36.7 C), Max:98.5 F (36.9 C)   Recent Labs Lab 05/14/17 1745 04/26/2017 1311 05/10/2017 1358 05/15/2017 1710 05/17/17 0307 05/18/17 1006 05/18/17 1208 05/18/17 1636 05/19/17 0508 05/20/17 0418  WBC 14.0* 14.4*  --   --   --  17.8*  --   --  21.0* 22.1*  CREATININE  --  0.72  --   --   --  0.73  --   --  0.84 1.00  LATICACIDVEN  --   --  4.1* 2.0* 2.4*  --   --  1.7  --   --   VANCOTROUGH  --   --   --   --   --   --  37*  --   --   --     Estimated Creatinine Clearance: 76 mL/min (by C-G formula based on SCr of 1 mg/dL).    Allergies  Allergen Reactions  . Belladonna Alkaloids Other (See Comments)    Unsure of reaction Unsure of reaction    Antimicrobials this admission: 9/23 Cefepime >> 9/25 9/23 Vancomycin >> 9/25 9/23 Zosyn >>   Dose adjustments this admission: N/A  Microbiology results: 9/23 BCx: no growth in 4 days  9/23 UCx: no growth  9/25 Sputum: moderate yeast with pseduohyphae, culture reincubated for better growth 9/23 MRSA PCR: negative  Thank you for allowing pharmacy to be a part of this patient's care.  Dwain Sarna, PharmD Student 05/20/2017 11:02 AM

## 2017-05-20 NOTE — Progress Notes (Signed)
Rt called to bedside due to patients sat dropping to 86% and staying there. Recruitment maneuver times one done with sats increasing to 95%. Patient returned to 15 of peep and left there. Sats stayed 91-92%. RN Elnita Maxwell notified of change.

## 2017-05-21 ENCOUNTER — Inpatient Hospital Stay: Payer: Medicaid Other

## 2017-05-21 DIAGNOSIS — N179 Acute kidney failure, unspecified: Secondary | ICD-10-CM

## 2017-05-21 DIAGNOSIS — R6520 Severe sepsis without septic shock: Secondary | ICD-10-CM

## 2017-05-21 DIAGNOSIS — J80 Acute respiratory distress syndrome: Secondary | ICD-10-CM

## 2017-05-21 LAB — CULTURE, RESPIRATORY

## 2017-05-21 LAB — BASIC METABOLIC PANEL
ANION GAP: 6 (ref 5–15)
Anion gap: 7 (ref 5–15)
BUN: 56 mg/dL — AB (ref 6–20)
BUN: 70 mg/dL — ABNORMAL HIGH (ref 6–20)
CALCIUM: 7.6 mg/dL — AB (ref 8.9–10.3)
CALCIUM: 7.8 mg/dL — AB (ref 8.9–10.3)
CHLORIDE: 114 mmol/L — AB (ref 101–111)
CO2: 22 mmol/L (ref 22–32)
CO2: 23 mmol/L (ref 22–32)
CREATININE: 1.55 mg/dL — AB (ref 0.44–1.00)
Chloride: 117 mmol/L — ABNORMAL HIGH (ref 101–111)
Creatinine, Ser: 1.88 mg/dL — ABNORMAL HIGH (ref 0.44–1.00)
GFR calc Af Amer: 47 mL/min — ABNORMAL LOW (ref >60)
GFR, EST AFRICAN AMERICAN: 37 mL/min — AB (ref >60)
GFR, EST NON AFRICAN AMERICAN: 40 mL/min — AB (ref >60)
GFR, EST NON AFRICAN AMERICAN: 32 mL/min — AB (ref >60)
Glucose, Bld: 199 mg/dL — ABNORMAL HIGH (ref 65–99)
Glucose, Bld: 154 mg/dL — ABNORMAL HIGH (ref 65–99)
Potassium: 3.2 mmol/L — ABNORMAL LOW (ref 3.5–5.1)
Potassium: 4.7 mmol/L (ref 3.5–5.1)
SODIUM: 143 mmol/L (ref 135–145)
Sodium: 146 mmol/L — ABNORMAL HIGH (ref 135–145)

## 2017-05-21 LAB — GLUCOSE, CAPILLARY
GLUCOSE-CAPILLARY: 181 mg/dL — AB (ref 65–99)
GLUCOSE-CAPILLARY: 136 mg/dL — AB (ref 65–99)
Glucose-Capillary: 159 mg/dL — ABNORMAL HIGH (ref 65–99)
Glucose-Capillary: 176 mg/dL — ABNORMAL HIGH (ref 65–99)
Glucose-Capillary: 209 mg/dL — ABNORMAL HIGH (ref 65–99)

## 2017-05-21 LAB — CULTURE, RESPIRATORY W GRAM STAIN

## 2017-05-21 LAB — CULTURE, BLOOD (ROUTINE X 2)
CULTURE: NO GROWTH
Culture: NO GROWTH
Culture: NO GROWTH
Special Requests: ADEQUATE

## 2017-05-21 LAB — PROCALCITONIN: Procalcitonin: 1.3 ng/mL

## 2017-05-21 LAB — CBC
HCT: 35.3 % (ref 35.0–47.0)
Hemoglobin: 11.6 g/dL — ABNORMAL LOW (ref 12.0–16.0)
MCH: 29.5 pg (ref 26.0–34.0)
MCHC: 32.9 g/dL (ref 32.0–36.0)
MCV: 89.5 fL (ref 80.0–100.0)
PLATELETS: 46 10*3/uL — AB (ref 150–440)
RBC: 3.94 MIL/uL (ref 3.80–5.20)
RDW: 16.4 % — AB (ref 11.5–14.5)
WBC: 20.9 10*3/uL — ABNORMAL HIGH (ref 3.6–11.0)

## 2017-05-21 LAB — PHOSPHORUS: Phosphorus: 2.8 mg/dL (ref 2.5–4.6)

## 2017-05-21 LAB — MAGNESIUM
MAGNESIUM: 2.2 mg/dL (ref 1.7–2.4)
MAGNESIUM: 2.2 mg/dL (ref 1.7–2.4)

## 2017-05-21 LAB — TROPONIN I: TROPONIN I: 0.03 ng/mL — AB (ref <0.03)

## 2017-05-21 MED ORDER — DILTIAZEM HCL 100 MG IV SOLR
0.0000 mg/h | INTRAVENOUS | Status: DC
  Administered 2017-05-21: 15 mg/h via INTRAVENOUS
  Administered 2017-05-21 – 2017-05-22 (×2): 5 mg/h via INTRAVENOUS
  Filled 2017-05-21 (×3): qty 100

## 2017-05-21 MED ORDER — DILTIAZEM LOAD VIA INFUSION
10.0000 mg | Freq: Once | INTRAVENOUS | Status: AC
  Administered 2017-05-21: 10 mg via INTRAVENOUS
  Filled 2017-05-21: qty 10

## 2017-05-21 MED ORDER — FLUCONAZOLE IN SODIUM CHLORIDE 400-0.9 MG/200ML-% IV SOLN
400.0000 mg | INTRAVENOUS | Status: DC
  Administered 2017-05-22 – 2017-05-24 (×3): 400 mg via INTRAVENOUS
  Filled 2017-05-21 (×4): qty 200

## 2017-05-21 MED ORDER — CHLORHEXIDINE GLUCONATE 0.12 % MT SOLN
15.0000 mL | Freq: Two times a day (BID) | OROMUCOSAL | Status: DC
  Administered 2017-05-21 – 2017-05-22 (×3): 15 mL via OROMUCOSAL
  Filled 2017-05-21: qty 15

## 2017-05-21 MED ORDER — SODIUM CHLORIDE 0.9 % IV SOLN
1.0000 g | Freq: Three times a day (TID) | INTRAVENOUS | Status: DC
  Administered 2017-05-21 – 2017-05-24 (×9): 1 g via INTRAVENOUS
  Filled 2017-05-21 (×14): qty 1

## 2017-05-21 MED ORDER — POTASSIUM CHLORIDE 20 MEQ/15ML (10%) PO SOLN
40.0000 meq | Freq: Three times a day (TID) | ORAL | Status: DC
  Administered 2017-05-21: 40 meq
  Filled 2017-05-21 (×3): qty 30

## 2017-05-21 MED ORDER — DIPHENHYDRAMINE HCL 50 MG/ML IJ SOLN
12.5000 mg | Freq: Once | INTRAMUSCULAR | Status: AC
  Administered 2017-05-21: 12.5 mg via INTRAVENOUS
  Filled 2017-05-21: qty 1

## 2017-05-21 MED ORDER — FUROSEMIDE 10 MG/ML IJ SOLN
40.0000 mg | Freq: Two times a day (BID) | INTRAMUSCULAR | Status: AC
  Administered 2017-05-21 – 2017-05-22 (×2): 40 mg via INTRAVENOUS
  Filled 2017-05-21 (×2): qty 4

## 2017-05-21 MED ORDER — BUDESONIDE 0.25 MG/2ML IN SUSP
0.2500 mg | Freq: Four times a day (QID) | RESPIRATORY_TRACT | Status: DC
  Administered 2017-05-21 – 2017-05-24 (×12): 0.25 mg via RESPIRATORY_TRACT
  Filled 2017-05-21 (×11): qty 2

## 2017-05-21 MED ORDER — FLUCONAZOLE IN SODIUM CHLORIDE 400-0.9 MG/200ML-% IV SOLN
800.0000 mg | Freq: Once | INTRAVENOUS | Status: AC
  Administered 2017-05-21: 800 mg via INTRAVENOUS
  Filled 2017-05-21: qty 400

## 2017-05-21 MED ORDER — POTASSIUM CHLORIDE 20 MEQ/15ML (10%) PO SOLN
30.0000 meq | ORAL | Status: AC
  Administered 2017-05-21 (×2): 30 meq
  Filled 2017-05-21 (×3): qty 22.5

## 2017-05-21 MED ORDER — METOPROLOL TARTRATE 5 MG/5ML IV SOLN
INTRAVENOUS | Status: AC
  Administered 2017-05-21: 5 mg via INTRAVENOUS
  Filled 2017-05-21: qty 5

## 2017-05-21 MED ORDER — IPRATROPIUM-ALBUTEROL 0.5-2.5 (3) MG/3ML IN SOLN
3.0000 mL | Freq: Four times a day (QID) | RESPIRATORY_TRACT | Status: DC
  Administered 2017-05-21 – 2017-05-24 (×12): 3 mL via RESPIRATORY_TRACT
  Filled 2017-05-21 (×12): qty 3

## 2017-05-21 MED ORDER — METOPROLOL TARTRATE 5 MG/5ML IV SOLN
5.0000 mg | Freq: Once | INTRAVENOUS | Status: AC
  Administered 2017-05-21: 5 mg via INTRAVENOUS
  Filled 2017-05-21: qty 5

## 2017-05-21 MED ORDER — FAMOTIDINE 20 MG PO TABS
20.0000 mg | ORAL_TABLET | Freq: Two times a day (BID) | ORAL | Status: DC
  Administered 2017-05-21: 20 mg
  Filled 2017-05-21: qty 1

## 2017-05-21 MED ORDER — AMIODARONE IV BOLUS ONLY 150 MG/100ML
150.0000 mg | Freq: Once | INTRAVENOUS | Status: AC
  Administered 2017-05-21: 150 mg via INTRAVENOUS
  Filled 2017-05-21: qty 100

## 2017-05-21 MED ORDER — FREE WATER
100.0000 mL | Freq: Four times a day (QID) | Status: DC
  Administered 2017-05-21 – 2017-05-22 (×5): 100 mL

## 2017-05-21 NOTE — Progress Notes (Signed)
[facility] CLINIC INFECTIOUS DISEASE PROGRESS NOTE Date of Admission:  06-08-2017     ID: Lindsey Allison is a 41 y.o. female with severe PNA  Active Problems:   Sepsis (HCC)   Pneumonia of both lungs due to infectious organism   Subjective: Remains in unit intubated and on high peep and FiO2 65%  Has progressive rash on chest and abd  ROS  Unable to obtain  Medications:  Antibiotics Given (last 72 hours)    Date/Time Action Medication Dose Rate   05/18/17 2145 New Bag/Given   piperacillin-tazobactam (ZOSYN) IVPB 3.375 g 3.375 g 12.5 mL/hr   05/19/17 0524 New Bag/Given   piperacillin-tazobactam (ZOSYN) IVPB 3.375 g 3.375 g 12.5 mL/hr   05/19/17 1519 New Bag/Given   piperacillin-tazobactam (ZOSYN) IVPB 3.375 g 3.375 g 12.5 mL/hr   05/19/17 2114 New Bag/Given   piperacillin-tazobactam (ZOSYN) IVPB 3.375 g 3.375 g 12.5 mL/hr   05/20/17 0501 New Bag/Given   piperacillin-tazobactam (ZOSYN) IVPB 3.375 g 3.375 g 12.5 mL/hr   05/20/17 1324 New Bag/Given   piperacillin-tazobactam (ZOSYN) IVPB 3.375 g 3.375 g 12.5 mL/hr   05/20/17 2152 New Bag/Given   piperacillin-tazobactam (ZOSYN) IVPB 3.375 g 3.375 g 12.5 mL/hr   05/21/17 0524 New Bag/Given   piperacillin-tazobactam (ZOSYN) IVPB 3.375 g 3.375 g 12.5 mL/hr   05/21/17 1317 New Bag/Given   piperacillin-tazobactam (ZOSYN) IVPB 3.375 g 3.375 g 12.5 mL/hr     . budesonide (PULMICORT) nebulizer solution  0.25 mg Nebulization Q6H  . chlorhexidine  15 mL Mouth/Throat BID  . erythromycin   Both Eyes Q6H  . famotidine  20 mg Per Tube BID  . free water  100 mL Per Tube Q6H  . furosemide  40 mg Intravenous BID  . insulin aspart  0-15 Units Subcutaneous Q4H  . ipratropium-albuterol  3 mL Nebulization Q6H  . mouth rinse  15 mL Mouth Rinse 10 times per day  . potassium chloride  40 mEq Per Tube TID  . senna-docusate  1 tablet Oral BID  . sodium chloride flush  10-40 mL Intracatheter Q12H    Objective: Vital signs in last 24  hours: Temp:  [97.8 F (36.6 C)-99.3 F (37.4 C)] 98.3 F (36.8 C) (09/28 1200) Pulse Rate:  [95-114] 109 (09/28 1300) Resp:  [17-33] 26 (09/28 1300) BP: (97-148)/(55-110) 119/65 (09/28 1300) SpO2:  [84 %-100 %] 93 % (09/28 1300) FiO2 (%):  [50 %-100 %] 70 % (09/28 1200) Weight:  [75.7 kg (166 lb 14.2 oz)] 75.7 kg (166 lb 14.2 oz) (09/28 0400) Physical Exam  Constitutional:  Intubated, sedated HENT: [place]/AT, PERRLA, no scleral icterus TLC L neck wnl Mouth/Throat: Oropharynx is clear and moist. No oropharyngeal exudate.  Cardiovascular: Normal rate, regular rhythm and normal heart sounds. Exam reveals no gallop and no friction rub.  No murmur heard.  Pulmonary/Chest: mech  Breath sounds, rhonchi L  Neck = supple, no nuchal rigidity Abdominal: Soft. Bowel sounds are normal.  exhibits no distension. There is no tenderness.  Lymphadenopathy: no cervical adenopathy. No axillary adenopathy Neurological: sedated  Skin: Skin is warm and dry. Diffuse erythematous blanching eruption on chest, abd and back Psychiatric: sedated   Lab Results  Recent Labs  05/20/17 0418 05/21/17 0414  WBC 22.1* 20.9*  HGB 11.8* 11.6*  HCT 36.6 35.3  NA 144 143  K 3.5 3.2*  CL 117* 114*  CO2 22 22  BUN 37* 56*  CREATININE 1.00 1.55*    Microbiology: Results for orders placed or performed during the  hospital encounter of 06-01-2017  Culture, blood (routine x 2)     Status: Abnormal   Collection Time: 01-Jun-2017  1:58 PM  Result Value Ref Range Status   Specimen Description BLOOD LT ARM  Final   Special Requests   Final    BOTTLES DRAWN AEROBIC AND ANAEROBIC Blood Culture results may not be optimal due to an inadequate volume of blood received in culture bottles   Culture  Setup Time   Final    GRAM POSITIVE COCCI ANAEROBIC BOTTLE ONLY CRITICAL RESULT CALLED TO, READ BACK BY AND VERIFIED WITH: LISA KLUTTZ AT 1025 05/17/17 SDR    Culture (A)  Final    STAPHYLOCOCCUS SPECIES (COAGULASE  NEGATIVE) THE SIGNIFICANCE OF ISOLATING THIS ORGANISM FROM A SINGLE SET OF BLOOD CULTURES WHEN MULTIPLE SETS ARE DRAWN IS UNCERTAIN. PLEASE NOTIFY THE MICROBIOLOGY DEPARTMENT WITHIN ONE WEEK IF SPECIATION AND SENSITIVITIES ARE REQUIRED. Performed at [facility], [address], [place], [place] [address]    Report Status 05/19/2017 FINAL  Final  Blood Culture ID Panel (Reflexed)     Status: Abnormal   Collection Time: 01-Jun-2017  1:58 PM  Result Value Ref Range Status   Enterococcus species NOT DETECTED NOT DETECTED Final   Listeria monocytogenes NOT DETECTED NOT DETECTED Final   Staphylococcus species DETECTED (A) NOT DETECTED Final    Comment: Methicillin (oxacillin) resistant coagulase negative staphylococcus. Possible blood culture contaminant (unless isolated from more than one blood culture draw or clinical case suggests pathogenicity). No antibiotic treatment is indicated for blood  culture contaminants. CRITICAL RESULT CALLED TO, READ BACK BY AND VERIFIED WITH: LISA KLUTZ ON 05/17/17 AT 1025 SDR    Staphylococcus aureus NOT DETECTED NOT DETECTED Final   Methicillin resistance DETECTED (A) NOT DETECTED Final    Comment: CRITICAL RESULT CALLED TO, READ BACK BY AND VERIFIED WITH: LISA KLUTZ ON 05/17/17 AT 1025 SDR    Streptococcus species NOT DETECTED NOT DETECTED Final   Streptococcus agalactiae NOT DETECTED NOT DETECTED Final   Streptococcus pneumoniae NOT DETECTED NOT DETECTED Final   Streptococcus pyogenes NOT DETECTED NOT DETECTED Final   Acinetobacter baumannii NOT DETECTED NOT DETECTED Final   Enterobacteriaceae species NOT DETECTED NOT DETECTED Final   Enterobacter cloacae complex NOT DETECTED NOT DETECTED Final   Escherichia coli NOT DETECTED NOT DETECTED Final   Klebsiella oxytoca NOT DETECTED NOT DETECTED Final   Klebsiella pneumoniae NOT DETECTED NOT DETECTED Final   Proteus species NOT DETECTED NOT DETECTED Final   Serratia marcescens NOT DETECTED NOT DETECTED  Final   Haemophilus influenzae NOT DETECTED NOT DETECTED Final   Neisseria meningitidis NOT DETECTED NOT DETECTED Final   Pseudomonas aeruginosa NOT DETECTED NOT DETECTED Final   Candida albicans NOT DETECTED NOT DETECTED Final   Candida glabrata NOT DETECTED NOT DETECTED Final   Candida krusei NOT DETECTED NOT DETECTED Final   Candida parapsilosis NOT DETECTED NOT DETECTED Final   Candida tropicalis NOT DETECTED NOT DETECTED Final  Culture, blood (routine x 2)     Status: None   Collection Time: 01-Jun-2017  3:26 PM  Result Value Ref Range Status   Specimen Description BLOOD RT ARM  Final   Special Requests   Final    BOTTLES DRAWN AEROBIC AND ANAEROBIC Blood Culture results may not be optimal due to an inadequate volume of blood received in culture bottles   Culture NO GROWTH 5 DAYS  Final   Report Status 05/21/2017 FINAL  Final  Urine culture  Status: None   Collection Time: 05/13/2017  4:57 PM  Result Value Ref Range Status   Specimen Description URINE, CLEAN CATCH  Final   Special Requests NONE  Final   Culture   Final    NO GROWTH Performed at [facility], [address], [place], [place] [address]    Report Status 05/18/2017 FINAL  Final  Culture, blood (routine x 2) Call MD if unable to obtain prior to antibiotics being given     Status: None   Collection Time: 04/27/2017  5:54 PM  Result Value Ref Range Status   Specimen Description BLOOD LT WRIST  Final   Special Requests   Final    BOTTLES DRAWN AEROBIC AND ANAEROBIC Blood Culture adequate volume   Culture NO GROWTH 5 DAYS  Final   Report Status 05/21/2017 FINAL  Final  MRSA PCR Screening     Status: None   Collection Time: 04/27/2017  6:35 PM  Result Value Ref Range Status   MRSA by PCR NEGATIVE NEGATIVE Final    Comment:        The GeneXpert MRSA Assay (FDA approved for NASAL specimens only), is one component of a comprehensive MRSA colonization surveillance program. It is not intended to diagnose  MRSA infection nor to guide or monitor treatment for MRSA infections.   Culture, blood (routine x 2) Call MD if unable to obtain prior to antibiotics being given     Status: None   Collection Time: 05/08/2017  7:10 PM  Result Value Ref Range Status   Specimen Description BLOOD RIGHT WRIST  Final   Special Requests   Final    BOTTLES DRAWN AEROBIC AND ANAEROBIC Blood Culture results may not be optimal due to an excessive volume of blood received in culture bottles   Culture NO GROWTH 5 DAYS  Final   Report Status 05/21/2017 FINAL  Final  Respiratory Panel by PCR     Status: None   Collection Time: 05/18/17  2:21 PM  Result Value Ref Range Status   Adenovirus NOT DETECTED NOT DETECTED Final   Coronavirus 229E NOT DETECTED NOT DETECTED Final   Coronavirus HKU1 NOT DETECTED NOT DETECTED Final   Coronavirus NL63 NOT DETECTED NOT DETECTED Final   Coronavirus OC43 NOT DETECTED NOT DETECTED Final   Metapneumovirus NOT DETECTED NOT DETECTED Final   Rhinovirus / Enterovirus NOT DETECTED NOT DETECTED Final   Influenza A NOT DETECTED NOT DETECTED Final   Influenza B NOT DETECTED NOT DETECTED Final   Parainfluenza Virus 1 NOT DETECTED NOT DETECTED Final   Parainfluenza Virus 2 NOT DETECTED NOT DETECTED Final   Parainfluenza Virus 3 NOT DETECTED NOT DETECTED Final   Parainfluenza Virus 4 NOT DETECTED NOT DETECTED Final   Respiratory Syncytial Virus NOT DETECTED NOT DETECTED Final   Bordetella pertussis NOT DETECTED NOT DETECTED Final   Chlamydophila pneumoniae NOT DETECTED NOT DETECTED Final   Mycoplasma pneumoniae NOT DETECTED NOT DETECTED Final    Comment: Performed at [facility], [address] [place], [place] [address]  Culture, respiratory (NON-Expectorated)     Status: None   Collection Time: 05/18/17  2:21 PM  Result Value Ref Range Status   Specimen Description SPUTUM  Final   Special Requests NONE  Final   Gram Stain   Final    ABUNDANT WBC PRESENT, PREDOMINANTLY  MONONUCLEAR ABUNDANT YEAST Performed at [facility], [address], [place], [place] [address]    Culture MODERATE CANDIDA ALBICANS  Final   Report Status 05/21/2017 FINAL  Final  Culture, respiratory (NON-Expectorated)     Status: None (Preliminary result)   Collection Time: 05/19/17  1:40 AM  Result Value Ref Range Status   Specimen Description SPUTUM  Final   Special Requests NONE  Final   Gram Stain   Final    ABUNDANT WBC PRESENT, PREDOMINANTLY PMN MODERATE YEAST WITH PSEUDOHYPHAE    Culture   Final    FEW YEAST IDENTIFICATION TO FOLLOW Performed at [facility], [address] [place], [place] [address]    Report Status PENDING  Incomplete    Studies/Results: Dg Chest Port 1 View  Result Date: 05/20/2017 CLINICAL DATA:  Respiratory failure. EXAM: PORTABLE CHEST 1 VIEW COMPARISON:  05/19/2017. FINDINGS: Endotracheal tube, NG tube, left IJ line stable position. Heart size normal. Diffuse bilateral pulmonary infiltrates/edema again noted. No interim change. Small left pleural effusion cannot be excluded. No pneumothorax. IMPRESSION: 1. Lines and tubes in stable position. 2. Diffuse bilateral pulmonary infiltrates/edema again noted. No interim change. Small left pleural effusion cannot be excluded. Electronically Signed   By: Maisie Fus  Register   On: 05/20/2017 06:34   Dg Chest Port 1 View  Result Date: 05/19/2017 CLINICAL DATA:  The EXAM: PORTABLE CHEST 1 VIEW COMPARISON:  Prior radiograph from 05/18/2017. FINDINGS: Endotracheal tube in place with tip positioned 19 mm above the carina. Enteric tube courses in the abdomen. Left IJ approach centra venous catheter in place with tip in the proximal right atrium, stable. Cardiac and mediastinal silhouettes are unchanged. Patchy multifocal opacities with consolidation and air bronchograms again seen involving the bilateral lungs, left greater than right. There is increased opacity at the left lung base as compared to previous,  with some improved aeration at the left upper lobe. Right lung relatively similar in appearance. No pleural effusion. No appreciable pneumothorax. Osseous structures unchanged. IMPRESSION: 1. Support apparatus in satisfactory position as above. 2. Persistent patchy multifocal airspace opacities, left greater than right, concerning for multilobar pneumonia. As compared to previous, there is worsened opacity at the left lung base, with slightly improved aeration at the left upper lung. Electronically Signed   By: Rise Mu M.D.   On: 05/19/2017 15:54   Dg Abd Portable 1v  Result Date: 05/20/2017 CLINICAL DATA:  Orogastric tube placement. EXAM: PORTABLE ABDOMEN - 1 VIEW COMPARISON:  Radiographs of May 18, 2017. FINDINGS: The bowel gas pattern is normal. No radio-opaque calculi or other significant radiographic abnormality are seen. Status post cholecystectomy. Distal tip of orogastric tube is seen in proximal stomach. 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 IMPRESSION: No evidence of bowel obstruction or ileus. Distal tip of orogastric tube is seen in proximal stomach. Electronically Signed   By: Lupita Raider, M.D.   On: 05/20/2017 14:39    Assessment/Plan: Lindsey Allison is a 41 y.o. female with multifocal PNA on CT chest. Possible aspiration.  CT shows severe dense infiltrate.  Had progressive decline, now intubated and in unit. CT with severe disease. Sputum cx with candida. MRSA PCR neg. Urine lg and urine strep PNA neg.  Resp PCR negative. I suspect severe aspiration Has developed progressive rash I suspect is due to zosyn  Rec Dc zosyn and change to meropenem Continue fluconazole Cont supportive care Thank you very much for the consult. Will follow with you.  Mick Sell   05/21/2017, 2:25 PM

## 2017-05-21 NOTE — Progress Notes (Signed)
[employer] [facility] [place] [facility]                                                                                                                                                                                  Patient Demographics   Lindsey Allison, is a 41 y.o. female, DOB - 1976/06/14, ZOX:[id number]  Admit date - 05/04/2017   Admitting Physician Ramonita Lab, MD  Outpatient Primary MD for the patient is Burns, Shellia Cleverly, MD   LOS - 5  Subjective: No significant change remains on the ventilator  Review of Systems:   CONSTITUTIONAL:limited Due to patient's illness  Vitals:   Vitals:   05/21/17 1100 05/21/17 1200 05/21/17 1300 05/21/17 1430  BP: (!) 127/59 122/67 119/65   Pulse: (!) 110 (!) 114 (!) 109   Resp: (!) 24 20 (!) 26   Temp:  98.3 F (36.8 C)    TempSrc:  Axillary    SpO2: 95% 95% 93% 96%  Weight:      Height:        Wt Readings from Last 3 Encounters:  05/21/17 166 lb 14.2 oz (75.7 kg)  04/04/17 142 lb (64.4 kg)  01/12/17 145 lb (65.8 kg)     Intake/Output Summary (Last 24 hours) at 05/21/17 1506 Last data filed at 05/21/17 1300  Gross per 24 hour  Intake          3636.36 ml  Output              695 ml  Net          2941.36 ml    Physical Exam:   GENERAL: intubated  HEAD, EYES, EARS, NOSE AND THROAT: Bilateral conjunctival erythema lip swelling NECK: Supple. There is no jugular venous distention. No bruits, no lymphadenopathy, no thyromegaly.  HEART: Regular rate and rhythm,. No murmurs, no rubs, no clicks.  LUNGS: decreased breath sounds bilaterally ABDOMEN: Soft, flat, nontender, nondistended. Has good bowel sounds. No hepatosplenomegaly appreciated.  EXTREMITIES: No evidence of any cyanosis, clubbing, or peripheral edema.  +2 pedal and radial pulses bilaterally.  NEUROLOGIC: intubated SKIN: Erythematous rash all over her chest And abdomen Psych: intubated LN: No inguinal LN enlargement    Antibiotics   Anti-infectives     Start     Dose/Rate Route Frequency Ordered Stop   05/22/17 1000  fluconazole (DIFLUCAN) IVPB 400 mg     400 mg 100 mL/hr over 120 Minutes Intravenous Every 24 hours 05/21/17 1113     05/21/17 1500  meropenem (MERREM) 1 g in sodium chloride 0.9 % 100 mL IVPB     1 g 200 mL/hr over 30 Minutes Intravenous Every 8 hours  05/21/17 1438     05/21/17 1200  fluconazole (DIFLUCAN) IVPB 800 mg     800 mg 100 mL/hr over 240 Minutes Intravenous  Once 05/21/17 1113     05/18/17 0900  piperacillin-tazobactam (ZOSYN) IVPB 3.375 g  Status:  Discontinued     3.375 g 12.5 mL/hr over 240 Minutes Intravenous Every 8 hours 05/18/17 0851 05/21/17 1433   05/17/17 0200  vancomycin (VANCOCIN) IVPB 750 mg/150 ml premix  Status:  Discontinued     750 mg 150 mL/hr over 60 Minutes Intravenous Every 8 hours 05/14/2017 1857 05/15/2017 1902   05/06/2017 2100  ceFEPIme (MAXIPIME) 2 g in dextrose 5 % 50 mL IVPB  Status:  Discontinued     2 g 100 mL/hr over 30 Minutes Intravenous Every 8 hours 05/15/2017 1740 05/18/17 0851   05/19/2017 2100  vancomycin (VANCOCIN) IVPB 750 mg/150 ml premix  Status:  Discontinued     750 mg 150 mL/hr over 60 Minutes Intravenous Every 8 hours 05/10/2017 1902 05/18/17 0851   05/04/2017 2000  vancomycin (VANCOCIN) IVPB 750 mg/150 ml premix  Status:  Discontinued     750 mg 150 mL/hr over 60 Minutes Intravenous  Once 04/28/2017 1856 05/20/2017 1859   04/24/2017 1416  piperacillin-tazobactam (ZOSYN) 3-0.375 GM/50ML IVPB    Comments:  Hatch, Shannin   : cabinet override      05/10/2017 1416 05/17/17 0229   05/20/2017 1400  vancomycin (VANCOCIN) IVPB 1000 mg/200 mL premix     1,000 mg 200 mL/hr over 60 Minutes Intravenous  Once 05/11/2017 1350 05/14/2017 1643   05/18/2017 1345  vancomycin (VANCOCIN) injection 1 g  Status:  Discontinued     1 g Intravenous  Once 04/25/2017 1345 05/05/2017 1350   05/17/2017 1345  piperacillin-tazobactam (ZOSYN) IVPB 3.375 g     3.375 g 100 mL/hr over 30 Minutes Intravenous  Once 05/03/2017 1345  05/06/2017 1504      Medications   Scheduled Meds: . budesonide (PULMICORT) nebulizer solution  0.25 mg Nebulization Q6H  . chlorhexidine  15 mL Mouth/Throat BID  . erythromycin   Both Eyes Q6H  . famotidine  20 mg Per Tube BID  . free water  100 mL Per Tube Q6H  . furosemide  40 mg Intravenous BID  . insulin aspart  0-15 Units Subcutaneous Q4H  . ipratropium-albuterol  3 mL Nebulization Q6H  . mouth rinse  15 mL Mouth Rinse 10 times per day  . potassium chloride  40 mEq Per Tube TID  . senna-docusate  1 tablet Oral BID  . sodium chloride flush  10-40 mL Intracatheter Q12H   Continuous Infusions: . feeding supplement (VITAL AF 1.2 CAL) 1,000 mL (05/21/17 1300)  . fentaNYL infusion INTRAVENOUS 100 mcg/hr (05/21/17 1334)  . [START ON 05/22/2017] fluconazole (DIFLUCAN) IV    . fluconazole (DIFLUCAN) IV 800 mg (05/21/17 1128)  . meropenem (MERREM) IV    . sodium chloride Stopped (05/18/17 1757)   PRN Meds:.acetaminophen, fentaNYL, midazolam, sodium chloride, sodium chloride flush   Data Review:   Micro Results Recent Results (from the past 240 hour(s))  Rapid strep screen     Status: None   Collection Time: 05/14/17  5:40 PM  Result Value Ref Range Status   Streptococcus, Group A Screen (Direct) NEGATIVE NEGATIVE Final    Comment: (NOTE) A Rapid Antigen test may result negative if the antigen level in the sample is below the detection level of this test. The FDA has not cleared this test as a stand-alone  test therefore the rapid antigen negative result has reflexed to a Group A Strep culture.   Culture, group A strep     Status: None   Collection Time: 05/14/17  5:40 PM  Result Value Ref Range Status   Specimen Description THROAT  Final   Special Requests NONE Reflexed from [id number]  Final   Culture   Final    NO GROUP A STREP (S.PYOGENES) ISOLATED Performed at [facility], [address], [place], [place] [address]    Report Status 05/17/2017 FINAL  Final   Culture, blood (routine x 2)     Status: Abnormal   Collection Time: 05-21-17  1:58 PM  Result Value Ref Range Status   Specimen Description BLOOD LT ARM  Final   Special Requests   Final    BOTTLES DRAWN AEROBIC AND ANAEROBIC Blood Culture results may not be optimal due to an inadequate volume of blood received in culture bottles   Culture  Setup Time   Final    GRAM POSITIVE COCCI ANAEROBIC BOTTLE ONLY CRITICAL RESULT CALLED TO, READ BACK BY AND VERIFIED WITH: LISA KLUTTZ AT 1025 05/17/17 SDR    Culture (A)  Final    STAPHYLOCOCCUS SPECIES (COAGULASE NEGATIVE) THE SIGNIFICANCE OF ISOLATING THIS ORGANISM FROM A SINGLE SET OF BLOOD CULTURES WHEN MULTIPLE SETS ARE DRAWN IS UNCERTAIN. PLEASE NOTIFY THE MICROBIOLOGY DEPARTMENT WITHIN ONE WEEK IF SPECIATION AND SENSITIVITIES ARE REQUIRED. Performed at [facility], [address] [place], [place] [address]    Report Status 05/19/2017 FINAL  Final  Blood Culture ID Panel (Reflexed)     Status: Abnormal   Collection Time: 2017-05-21  1:58 PM  Result Value Ref Range Status   Enterococcus species NOT DETECTED NOT DETECTED Final   Listeria monocytogenes NOT DETECTED NOT DETECTED Final   Staphylococcus species DETECTED (A) NOT DETECTED Final    Comment: Methicillin (oxacillin) resistant coagulase negative staphylococcus. Possible blood culture contaminant (unless isolated from more than one blood culture draw or clinical case suggests pathogenicity). No antibiotic treatment is indicated for blood  culture contaminants. CRITICAL RESULT CALLED TO, READ BACK BY AND VERIFIED WITH: LISA KLUTZ ON 05/17/17 AT 1025 SDR    Staphylococcus aureus NOT DETECTED NOT DETECTED Final   Methicillin resistance DETECTED (A) NOT DETECTED Final    Comment: CRITICAL RESULT CALLED TO, READ BACK BY AND VERIFIED WITH: LISA KLUTZ ON 05/17/17 AT 1025 SDR    Streptococcus species NOT DETECTED NOT DETECTED Final   Streptococcus agalactiae NOT DETECTED NOT DETECTED  Final   Streptococcus pneumoniae NOT DETECTED NOT DETECTED Final   Streptococcus pyogenes NOT DETECTED NOT DETECTED Final   Acinetobacter baumannii NOT DETECTED NOT DETECTED Final   Enterobacteriaceae species NOT DETECTED NOT DETECTED Final   Enterobacter cloacae complex NOT DETECTED NOT DETECTED Final   Escherichia coli NOT DETECTED NOT DETECTED Final   Klebsiella oxytoca NOT DETECTED NOT DETECTED Final   Klebsiella pneumoniae NOT DETECTED NOT DETECTED Final   Proteus species NOT DETECTED NOT DETECTED Final   Serratia marcescens NOT DETECTED NOT DETECTED Final   Haemophilus influenzae NOT DETECTED NOT DETECTED Final   Neisseria meningitidis NOT DETECTED NOT DETECTED Final   Pseudomonas aeruginosa NOT DETECTED NOT DETECTED Final   Candida albicans NOT DETECTED NOT DETECTED Final   Candida glabrata NOT DETECTED NOT DETECTED Final   Candida krusei NOT DETECTED NOT DETECTED Final   Candida parapsilosis NOT DETECTED NOT DETECTED Final   Candida tropicalis NOT DETECTED NOT DETECTED Final  Culture, blood (routine x 2)     Status: None   Collection Time: 05-19-17  3:26 PM  Result Value Ref Range Status   Specimen Description BLOOD RT ARM  Final   Special Requests   Final    BOTTLES DRAWN AEROBIC AND ANAEROBIC Blood Culture results may not be optimal due to an inadequate volume of blood received in culture bottles   Culture NO GROWTH 5 DAYS  Final   Report Status 05/21/2017 FINAL  Final  Urine culture     Status: None   Collection Time: 2017-05-19  4:57 PM  Result Value Ref Range Status   Specimen Description URINE, CLEAN CATCH  Final   Special Requests NONE  Final   Culture   Final    NO GROWTH Performed at [facility], [address] [place], [place] [address]    Report Status 05/18/2017 FINAL  Final  Culture, blood (routine x 2) Call MD if unable to obtain prior to antibiotics being given     Status: None   Collection Time: May 19, 2017  5:54 PM  Result Value Ref Range Status    Specimen Description BLOOD LT WRIST  Final   Special Requests   Final    BOTTLES DRAWN AEROBIC AND ANAEROBIC Blood Culture adequate volume   Culture NO GROWTH 5 DAYS  Final   Report Status 05/21/2017 FINAL  Final  MRSA PCR Screening     Status: None   Collection Time: 19-May-2017  6:35 PM  Result Value Ref Range Status   MRSA by PCR NEGATIVE NEGATIVE Final    Comment:        The GeneXpert MRSA Assay (FDA approved for NASAL specimens only), is one component of a comprehensive MRSA colonization surveillance program. It is not intended to diagnose MRSA infection nor to guide or monitor treatment for MRSA infections.   Culture, blood (routine x 2) Call MD if unable to obtain prior to antibiotics being given     Status: None   Collection Time: May 19, 2017  7:10 PM  Result Value Ref Range Status   Specimen Description BLOOD RIGHT WRIST  Final   Special Requests   Final    BOTTLES DRAWN AEROBIC AND ANAEROBIC Blood Culture results may not be optimal due to an excessive volume of blood received in culture bottles   Culture NO GROWTH 5 DAYS  Final   Report Status 05/21/2017 FINAL  Final  Respiratory Panel by PCR     Status: None   Collection Time: 05/18/17  2:21 PM  Result Value Ref Range Status   Adenovirus NOT DETECTED NOT DETECTED Final   Coronavirus 229E NOT DETECTED NOT DETECTED Final   Coronavirus HKU1 NOT DETECTED NOT DETECTED Final   Coronavirus NL63 NOT DETECTED NOT DETECTED Final   Coronavirus OC43 NOT DETECTED NOT DETECTED Final   Metapneumovirus NOT DETECTED NOT DETECTED Final   Rhinovirus / Enterovirus NOT DETECTED NOT DETECTED Final   Influenza A NOT DETECTED NOT DETECTED Final   Influenza B NOT DETECTED NOT DETECTED Final   Parainfluenza Virus 1 NOT DETECTED NOT DETECTED Final   Parainfluenza Virus 2 NOT DETECTED NOT DETECTED Final   Parainfluenza Virus 3 NOT DETECTED NOT DETECTED Final   Parainfluenza Virus 4 NOT DETECTED NOT DETECTED Final   Respiratory Syncytial Virus  NOT DETECTED NOT DETECTED Final   Bordetella pertussis NOT DETECTED NOT DETECTED Final   Chlamydophila pneumoniae NOT DETECTED NOT DETECTED Final   Mycoplasma pneumoniae NOT DETECTED NOT DETECTED Final  Comment: Performed at [facility], [address], [place], [place] [address]  Culture, respiratory (NON-Expectorated)     Status: None   Collection Time: 05/18/17  2:21 PM  Result Value Ref Range Status   Specimen Description SPUTUM  Final   Special Requests NONE  Final   Gram Stain   Final    ABUNDANT WBC PRESENT, PREDOMINANTLY MONONUCLEAR ABUNDANT YEAST Performed at [facility], [address], [place], [place] [address]    Culture MODERATE CANDIDA ALBICANS  Final   Report Status 05/21/2017 FINAL  Final  Culture, respiratory (NON-Expectorated)     Status: None   Collection Time: 05/19/17  1:40 AM  Result Value Ref Range Status   Specimen Description SPUTUM  Final   Special Requests NONE  Final   Gram Stain   Final    ABUNDANT WBC PRESENT, PREDOMINANTLY PMN MODERATE YEAST WITH PSEUDOHYPHAE Performed at [facility], [address], [place], [place] [address]    Culture FEW CANDIDA ALBICANS  Final   Report Status 05/21/2017 FINAL  Final    Radiology Reports Dg Chest 2 View  Result Date: May 23, 2017 CLINICAL DATA:  Cough and lethargy EXAM: CHEST  2 VIEW COMPARISON:  01/12/2017 FINDINGS: Cardiac shadow is within normal limits. The lungs are well aerated bilaterally. Patchy infiltrative changes are seen in the left mid and lower lung as well as the right lung base. No sizable effusion is seen. No bony abnormality is noted. Ventricular shunt is noted on the right. IMPRESSION: Bilateral infiltrates as described. Electronically Signed   By: Alcide Clever M.D.   On: 05-23-17 12:37   Ct Angio Chest Pe W Or Wo Contrast  Result Date: 05/18/2017 CLINICAL DATA:  Shortness of breath. EXAM: CT ANGIOGRAPHY CHEST WITH CONTRAST TECHNIQUE: Multidetector CT imaging of the  chest was performed using the standard protocol during bolus administration of intravenous contrast. Multiplanar CT image reconstructions and MIPs were obtained to evaluate the vascular anatomy. CONTRAST:  75 mL of Isovue 370 intravenously. COMPARISON:  Radiographs of May 17, 2017. FINDINGS: Cardiovascular: Satisfactory opacification of the pulmonary arteries to the segmental level. No evidence of pulmonary embolism. Normal heart size. No pericardial effusion. Mediastinum/Nodes: No enlarged mediastinal, hilar, or axillary lymph nodes. Thyroid gland, trachea, and esophagus demonstrate no significant findings. Lungs/Pleura: No pneumothorax is noted. Large left upper and lower lobe airspace opacities are noted most consistent with pneumonia. Smaller right upper and lower lobe opacities are also noted most consistent with pneumonia. Mild bilateral pleural effusions are noted. Upper Abdomen: No acute abnormality. Musculoskeletal: No chest wall abnormality. No acute or significant osseous findings. Review of the MIP images confirms the above findings. IMPRESSION: No definite evidence of pulmonary embolus. Bilateral upper and lower lobe airspace opacities are noted most consistent with pneumonia. Mild bilateral pleural effusions are noted. Electronically Signed   By: Lupita Raider, M.D.   On: 05/18/2017 12:10   Dg Chest Port 1 View  Result Date: 05/20/2017 CLINICAL DATA:  Respiratory failure. EXAM: PORTABLE CHEST 1 VIEW COMPARISON:  05/19/2017. FINDINGS: Endotracheal tube, NG tube, left IJ line stable position. Heart size normal. Diffuse bilateral pulmonary infiltrates/edema again noted. No interim change. Small left pleural effusion cannot be excluded. No pneumothorax. IMPRESSION: 1. Lines and tubes in stable position. 2. Diffuse bilateral pulmonary infiltrates/edema again noted. No interim change. Small left pleural effusion cannot be excluded. Electronically Signed   By: Maisie Fus  Register   On: 05/20/2017  06:34   Dg Chest [facility]  1 View  Result Date: 05/19/2017 CLINICAL DATA:  The EXAM: PORTABLE CHEST 1 VIEW COMPARISON:  Prior radiograph from 05/18/2017. FINDINGS: Endotracheal tube in place with tip positioned 19 mm above the carina. Enteric tube courses in the abdomen. Left IJ approach centra venous catheter in place with tip in the proximal right atrium, stable. Cardiac and mediastinal silhouettes are unchanged. Patchy multifocal opacities with consolidation and air bronchograms again seen involving the bilateral lungs, left greater than right. There is increased opacity at the left lung base as compared to previous, with some improved aeration at the left upper lobe. Right lung relatively similar in appearance. No pleural effusion. No appreciable pneumothorax. Osseous structures unchanged. IMPRESSION: 1. Support apparatus in satisfactory position as above. 2. Persistent patchy multifocal airspace opacities, left greater than right, concerning for multilobar pneumonia. As compared to previous, there is worsened opacity at the left lung base, with slightly improved aeration at the left upper lung. Electronically Signed   By: Rise Mu M.D.   On: 05/19/2017 15:54   Dg Chest Port 1 View  Result Date: 05/18/2017 CLINICAL DATA:  Central line placement EXAM: PORTABLE CHEST 1 VIEW COMPARISON:  05/17/2017 FINDINGS: Left IJ approach central line catheter tip is seen at the cavoatrial junction. No pneumothorax. The tip of an endotracheal tube is 3.1 cm above the carina. Gastric tube extends below the left hemidiaphragm into the expected location of the stomach. VP shunt tubing projects over the right hemithorax and extends into the abdomen. The tip is excluded on this study. Patchy pneumonic consolidations with air bronchograms are seen in both upper lobes, lingula and left lower lobe. Heart size is normal. The aorta is obscured by the adjacent pneumonia. IMPRESSION: 1. Satisfactory support line and tube  positions. 2. Left IJ central line catheter seen with tip at the cavoatrial junction. No pneumothorax. 3. Patchy multilobar pneumonia with air bronchograms. Electronically Signed   By: Tollie Eth M.D.   On: 05/18/2017 19:47   Dg Chest Port 1 View  Result Date: 05/17/2017 CLINICAL DATA:  Cough EXAM: PORTABLE CHEST 1 VIEW COMPARISON:  Chest radiograph 2017-06-08 at 12:07 p.m. FINDINGS: Marked worsening of consolidative opacity in the left mid lung compared to the earlier study. Aeration at the right lung base is improved. No pneumothorax or pleural effusion. IMPRESSION: Marked worsening of left mid lung consolidation, likely infection or aspiration. Electronically Signed   By: Deatra Robinson M.D.   On: 05/17/2017 02:18   Dg Abd Portable 1v  Result Date: 05/20/2017 CLINICAL DATA:  Orogastric tube placement. EXAM: PORTABLE ABDOMEN - 1 VIEW COMPARISON:  Radiographs of May 18, 2017. FINDINGS: The bowel gas pattern is normal. No radio-opaque calculi or other significant radiographic abnormality are seen. Status post cholecystectomy. Distal tip of orogastric tube is seen in proximal stomach. 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 IMPRESSION: No evidence of bowel obstruction or ileus. Distal tip of orogastric tube is seen in proximal stomach. Electronically Signed   By: Lupita Raider, M.D.   On: 05/20/2017 14:39   Dg Abd Portable 1v  Result Date: 05/18/2017 CLINICAL DATA:  OG tube placement EXAM: PORTABLE ABDOMEN - 1 VIEW COMPARISON:  None. FINDINGS: The tip and side port of a gastric tube are seen below the left hemidiaphragm in the expected location of the stomach. The tip however appears to coiled back toward the GE junction band is approximately 2 cm from the GE junction. Scattered gas containing small and large bowel loops without obstructive pattern. No free  air. Contrast is seen within both renal collecting systems likely from recent administration of IV contrast. VP shunt  tip projects over the left lower quadrant. Cholecystectomy clips are seen in the right upper quadrant. IMPRESSION: 1. Gastric tube tip and side-port are seen in the expected location of stomach however the tip projects back toward the GE junction and is seen approximately 2 cm from the expected location of the gastroesophageal juncture. 2. VP shunt tubing tip is seen in the left lower quadrant of the abdomen. Electronically Signed   By: Tollie Eth M.D.   On: 05/18/2017 19:44     CBC  Recent Labs Lab 05/14/17 1745 05/19/2017 1311 05/18/17 1006 05/19/17 0508 05/20/17 0418 05/21/17 0414  WBC 14.0* 14.4* 17.8* 21.0* 22.1* 20.9*  HGB 13.3 13.5 12.7 11.8* 11.8* 11.6*  HCT 40.2 41.2 38.9 36.3 36.6 35.3  PLT 121* 98* 79* 80* 58* 46*  MCV 88.7 88.1 87.8 88.0 87.9 89.5  MCH 29.4 28.8 28.7 28.5 28.4 29.5  MCHC 33.1 32.7 32.7 32.4 32.3 32.9  RDW 14.9* 15.3* 15.5* 15.7* 16.4* 16.4*  LYMPHSABS 1.0 1.1 1.0  --  0.4*  --   MONOABS 0.7 0.4 0.8  --  1.1*  --   EOSABS 0.2 0.1 0.0  --  0.0  --   BASOSABS 0.1 0.0 0.1  --  0.0  --     Chemistries   Recent Labs Lab 05/23/2017 1311 05/18/17 1006 05/19/17 0508 05/19/17 1821 05/20/17 0418 05/21/17 0414  NA 138 146* 148*  --  144 143  K 4.0 3.4* 3.0* 3.5 3.5 3.2*  CL 102 115* 120*  --  117* 114*  CO2 25 23 22   --  22 22  GLUCOSE 97 128* 183*  --  164* 199*  BUN 16 18 24*  --  37* 56*  CREATININE 0.72 0.73 0.84  --  1.00 1.55*  CALCIUM 8.6* 8.1* 7.9*  --  7.1* 7.6*  MG  --   --  2.2  --   --  2.2  AST 58*  --   --   --   --   --   ALT 61*  --   --   --   --   --   ALKPHOS 124  --   --   --   --   --   BILITOT 2.0*  --   --   --   --   --    ------------------------------------------------------------------------------------------------------------------ estimated creatinine clearance is 49.2 mL/min (A) (by C-G formula based on SCr of 1.55 mg/dL  (H)). ------------------------------------------------------------------------------------------------------------------ No results for input(s): HGBA1C in the last 72 hours. ------------------------------------------------------------------------------------------------------------------ No results for input(s): CHOL, HDL, LDLCALC, TRIG, CHOLHDL, LDLDIRECT in the last 72 hours. ------------------------------------------------------------------------------------------------------------------  Recent Labs  05/19/17 1821  TSH 0.438   ------------------------------------------------------------------------------------------------------------------ No results for input(s): VITAMINB12, FOLATE, FERRITIN, TIBC, IRON, RETICCTPCT in the last 72 hours.  Coagulation profile No results for input(s): INR, PROTIME in the last 168 hours.  No results for input(s): DDIMER in the last 72 hours.  Cardiac Enzymes  Recent Labs Lab 05/11/2017 1922 05/17/17 0042 05/19/17 1355  TROPONINI 0.03* 0.03* 0.07*   ------------------------------------------------------------------------------------------------------------------ Invalid input(s): POCBNP    Assessment & Plan  Lindsey Allison  is a 41 y.o. female with a known history of myoclonic dystrophy, anxiety, GERD,hydrocephalus,poor historian, is brought into the ED by her care giver as patient is not feeling well Patient was seen at urgent care on Friday, throat cultures were obtained and  patient was given a prescription for azithromycin for possible strep throat,. Both conjunctiva are red and patient is brought into the  Emergency department.Chest x-ray has revealed bilateral infiltrates  #Acute respiratory failure Due to bilateral consolidation Continue IV meropenem and fluconazole Ventilator support for now  myotonic dystrophy prognosis is not very good  # sepsis from aspiration pneumonia  Continue therapy with IV antibiotics Positive blood  cultures likely contamination  #atrial fib with rvr continue IV Cardizem drip  # erythematous rash Solumedrol for likely allergic rash  #Bilateral conjunctivitis continue erythromycin ointment some improvement   #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           Consults   none   DVT Prophylaxis  Lovenox    Lab Results  Component Value Date   PLT 46 (L) 05/21/2017     Time Spent in minutes   35 minutes spent  Auburn Bilberry M.D on 05/21/2017 at 3:06 PM  Between 7am to 6pm - Pager - [phone]  After 6pm go to [email or web address] - password [email or web address] [facility]  [facility]   Office  [phone]

## 2017-05-21 NOTE — Progress Notes (Signed)
Nutrition Follow Up Note   DOCUMENTATION CODES:   Not applicable  INTERVENTION:   Discontinue Prostat  Continue Vital AF 1.2 at 60 ml/hr via OGT (1440 ml goal daily volume). Provides 1728 kcal, 108 grams of protein, 1166 ml H2O daily. Utilize PEPuP protocol if needed  Decrease free water flushes to 100 ml q6hrs. Provides total of 1566 ml H2O including water in tube feeds.  NUTRITION DIAGNOSIS:   Inadequate oral intake related to inability to eat as evidenced by NPO status. Ongoing  GOAL:   Provide needs based on ASPEN/SCCM guidelines  MONITOR:   Vent status, Labs, Weight trends, TF tolerance, I & O's   ASSESSMENT:   41 year old female with PMHx of hydrocephalus, muscular dystrophy, anxiety, acid reflux, constipation who was brought in from group home as she was not feeling well and was admitted with sepsis from PNA. Required emergent intubation on 9/25 in setting of severe respiratory failure and ARDS.   Pt continues to be ventilated. BUN/creatinine slowly creeping up; will adjust tube feeds (discontinue Prostat). Pt with increased respiratory effort, +13L since admit, lasix given today. RD will decrease free water flushes. Pt with 5lb weight gain since admit; 1+ generalized edema/ 2+ edema BLE. Pt noted to have decreased urine output today. Pt also with red rash covering chest and abdomen. Type 6 stools today; pt with abdominal distension noted by MD. Pt also with decreased platelets and hypokalemia.         Medications reviewed and include: pepcid, lasix, insulin, KCl, senokot, fentanyl, zosyn, diflucan  Labs reviewed: K 3.2(L), Cl 114(H), BUN 56(H), creat 1.55(H), Ca 7.6(L), Mg 2.2 wnl P 3.1 wnl -9/27  Wbc- 20.9(H), plt 46(L) cbgs- 183, 164, 199 x 48hrs   Patient is currently intubated on ventilator support MV: 9.4 L/min Temp (24hrs), Avg:98.4 F (36.9 C), Min:97.8 F (36.6 C), Max:99.3 F (37.4 C)  Propofol: none  MAP- >22mmHg  Diet Order:   NPO  Skin:   Reviewed, no issues  Last BM:  9/28- type 6  Height:   Ht Readings from Last 1 Encounters:  05/17/17  (1.676 m)    Weight:   Wt Readings from Last 1 Encounters:  05/21/17 166 lb 14.2 oz (75.7 kg)    Ideal Body Weight:  59.1 kg  BMI:  Body mass index is 26.94 kg/m.  Estimated Nutritional Needs:   Kcal:  1799 (PSU 2003b w/ MSJ 1409, Ve 9, Tmax 37.5)  Protein:  90-110 grams (1.2-1.5 grams/kg)  Fluid:  2.2-2.6 L/day (30-35 ml/kg)  EDUCATION NEEDS:   No education needs identified at this time  Betsey Holiday MS, RD, LDN Pager #[phone] After Hours Pager: [phone]

## 2017-05-21 NOTE — Progress Notes (Signed)
Dr SimondSung Amabiletified of recurrent Afib w/RVR following bath.  5 mg metoprolol pushed, cardizem drip ordered and started.  Pt remains in Afib mostly in 120s at this time w/ drip at 10 mg/hr.  SBP 108.    Pt's bladder was scanned; was empty.  Foley irrigated with ease, clear return.  Renal US ordered d/t worsening BUN, creatinine, and decreasing output.  ABD remains distended, ABD XR negtive.  Zosyn now noted as an allergy for this patient per Dr Sampson Goon. Meropenem started.    100 mcg fent/hr d/t tachypnea, tachycardia and anxiety when infusing at 75 mcg/hr

## 2017-05-21 NOTE — Progress Notes (Signed)
O2 sats 87% on 60% FIO2, increased to 80% FIO2 and O2 sats now 96%.  No TV at this time, vent setting is 15/15.  Per Dr Sung Amabile LR infusion discontinued, and tube feeds decreased to 20 ml/hr for now d/t ABD distention.  Pt was not too agitated w/ fent at 50 mcgs, but became increasingly tachycardic and tachypneic, increased fent back to 75 mcgs.   RD discontinued Pro Stat in effort to address increasing BUN and creatinine

## 2017-05-21 NOTE — Progress Notes (Signed)
MEDICATION RELATED CONSULT NOTE   Pharmacy Consult for electrolyte monitoring  Indication: hypokalemia  Allergies  Allergen Reactions  . Belladonna Alkaloids Other (See Comments)    Unsure of reaction Unsure of reaction    Patient Measurements: Height:  (167.6 cm) Weight: 166 lb 14.2 oz (75.7 kg) IBW/kg (Calculated) : 59.3  Vital Signs: Temp: 98.3 F (36.8 C) (09/28 1200) Temp Source: Axillary (09/28 1200) BP: 119/65 (09/28 1300) Pulse Rate: 109 (09/28 1300) Intake/Output from previous day: 09/27 0701 - 09/28 0700 In: 4192.6 [I.V.:2002.6; NG/GT:2140; IV Piggyback:50] Out: 580 [Urine:580] Intake/Output from this shift: Total I/O In: 1232.2 [I.V.:167.6; NG/GT:614.7; IV Piggyback:450] Out: 220 [Urine:160; Stool:60]  Labs:  Recent Labs  05/19/17 0508 05/19/17 1821 05/20/17 0418 05/20/17 2045 05/21/17 0414  WBC 21.0*  --  22.1*  --  20.9*  HGB 11.8*  --  11.8*  --  11.6*  HCT 36.3  --  36.6  --  35.3  PLT 80*  --  58*  --  46*  CREATININE 0.84  --  1.00  --  1.55*  MG 2.2  --   --   --  2.2  PHOS 2.5 1.5* 3.5 3.1  --    Estimated Creatinine Clearance: 49.2 mL/min (A) (by C-G formula based on SCr of 1.55 mg/dL (H)).     Assessment: Lindsey Allison is a 8 YOF with a known history of myoclonic dystrophy, anxiety, GERD, hydrocephalus that presented to the ED with her care giver because she was not feeling well. Patient was found to have bilateral infiltrates and is being treated for aspiration pneumonia.  Plan:  K=3.2, patient has already received KCl supplementation this morning  No additional electrolyte replacement warranted at this time.   Will continue to monitor with am labs.   Pharmacy will continue to follow per consult.   Clovia Cuff, PharmD, BCPS 05/21/2017 2:16 PM

## 2017-05-21 NOTE — Progress Notes (Signed)
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 Transferred to ICU/SDU with worsening hypoxemic respiratory failure requiring intubation. CXR consistent with severe ARDS.  MAJOR EVENTS/TEST RESULTS: 09/23 admitted to hospitalist service. Treated with vancomycin/Zosyn. 09/25 CTA chest: Extensive bilateral airspace consolidation consistent with ARDS 09/25 transferred to ICU/SDU and required intubation.  INDWELLING DEVICES:: ETT 09/25 >>  L IJ CVL 09/25 >>   MICRO DATA: MRSA PCR 09/23 >> NEG Urine 09/23 >> NEG Resp virus panel 09/25 >> NEG Resp 09/25 >> moderate candida albicans Blood 09/23 >> 1/2 coag neg staph Resp 09/26 >> Few candida albicans  ANTIMICROBIALS:  Vanc 09/23 >> 09/25 Pip-tazo 09/23 >> 09/28 Fluconazole 09/28 >>  Meropenem 09/28 >>    SUBJECTIVE:  RASS -3, not F/C. Occasionally dyssynchronous  VITAL SIGNS: BP 119/65   Pulse (!) 109   Temp 98.3 F (36.8 C) (Axillary)   Resp (!) 26   Ht  (1.676 m)   Wt 166 lb 14.2 oz (75.7 kg)   SpO2 96%   BMI 26.94 kg/m   HEMODYNAMICS:    VENTILATOR SETTINGS: Vent Mode: PCV FiO2 (%):  [50 %-100 %] 70 % Set Rate:  [16 bmp] 16 bmp Vt Set:  [500 mL] 500 mL PEEP:  [15 cmH20] 15 cmH20  INTAKE / OUTPUT: I/O last 3 completed shifts: In: 4852.3 [I.V.:2062.3; NG/GT:2740; IV Piggyback:50] Out: 915 [Urine:915]  PHYSICAL EXAMINATION: General: Intubated, sedated, RASS -3 Neuro: CNs intact, MAEs HEENT: NCAT Cardiovascular: Tachy, regular, no M Lungs: Coarse bilateral rhonchi, no wheezes Abdomen: Distended, soft, no masses, diminished bowel sounds Extremities: Symmetric pedal and ankle edema Skin: Diffuse erythematous rash  LABS:  BMET  Recent Labs Lab 05/19/17 0508 05/19/17 1821  05/20/17 0418 05/21/17 0414  NA 148*  --  144 143  K 3.0* 3.5 3.5 3.2*  CL 120*  --  117* 114*  CO2 22  --  22 22  BUN 24*  --  37* 56*  CREATININE 0.84  --  1.00 1.55*  GLUCOSE 183*  --  164* 199*    Electrolytes  Recent Labs Lab 05/19/17 0508 05/19/17 1821 05/20/17 0418 05/20/17 2045 05/21/17 0414  CALCIUM 7.9*  --  7.1*  --  7.6*  MG 2.2  --   --   --  2.2  PHOS 2.5 1.5* 3.5 3.1  --     CBC  Recent Labs Lab 05/19/17 0508 05/20/17 0418 05/21/17 0414  WBC 21.0* 22.1* 20.9*  HGB 11.8* 11.8* 11.6*  HCT 36.3 36.6 35.3  PLT 80* 58* 46*    Coag's No results for input(s): APTT, INR in the last 168 hours.  Sepsis Markers  Recent Labs Lab 06/13/17 1710 05/17/17 0307 05/18/17 1636 05/19/17 0508 05/20/17 0418 05/21/17 0414  LATICACIDVEN 2.0* 2.4* 1.7  --   --   --   PROCALCITON  --   --   --  0.92 1.11 1.30    ABG  Recent Labs Lab 05/18/17 1503 05/20/17 0442  PHART 7.41 7.26*  PCO2ART 37 43  PO2ART 39* 61*    Liver Enzymes  Recent Labs Lab 06/13/17 1311  AST 58*  ALT 61*  ALKPHOS 124  BILITOT 2.0*  ALBUMIN 3.1*    Cardiac Enzymes  Recent Labs Lab Jun 13, 2017 1922 05/17/17 0042 05/19/17 1355  TROPONINI 0.03* 0.03* 0.07*    Glucose  Recent Labs Lab 05/20/17 1939 05/20/17 2350 05/21/17 0347 05/21/17 0748 05/21/17 1200 05/21/17 1615  GLUCAP 149* 175* 159* 176* 209* 181*    CXR: ARDS pattern   ASSESSMENT / PLAN:  PULMONARY A: Acute hypoxemic respiratory failure ARDS P:   Cont full vent support - settings reviewed and/or adjusted Cont vent bundle Daily SBT if/when meets criteria Changed to PCV mode 09/28 to improve ventilator synchrony  CARDIOVASCULAR A:  Paroxysmal atrial fibrillation P:  Note: This afternoon, she flipped back into atrial fibrillation Diltiazem infusion ordered  RENAL A:   AKI Low urine output Hypervolemia P:   Monitor BMET intermittently Monitor I/Os Correct electrolytes as indicated    GASTROINTESTINAL A:   Abdominal distention P:   SUP: Enteral famotidine Continue TF protocol - rate decreased to 20 mL/h 09/28  HEMATOLOGIC A:   Thrombocytopenia P:  DVT px: SCDs Monitor CBC intermittently Transfuse per usual guidelines   INFECTIOUS A:   Severe sepsis Suspected pneumonia Elevated PCT P:   Monitor temp, WBC count Micro and abx as above   ENDOCRINE A:   Steroid and stress induced hyperglycemia P:   Discontinue methylprednisolone 09/28 Continue SSI  NEUROLOGIC A:   Mild mental retardation ICU/vent associated discomfort P:   RASS goal: -1, -2 PAD protocol   FAMILY: I provided a complete update to her care providers at the bedside   CCM time: 40 mins The above time includes time spent in consultation with patient and/or family members and reviewing care plan on multidisciplinary rounds  Billy Fischer, MD PCCM service Mobile [phone] Pager [phone]  05/21/2017, 4:28 PM

## 2017-05-21 NOTE — Progress Notes (Addendum)
Pharmacy Antibiotic Note  Lindsey Allison is a 41 y.o. female admitted on 05/08/2017 with pneumonia and sepsis.  Pharmacy has been consulted for Fluconazole and Meropenem dosing. Currently on Zosyn, will be discontinued due to worsening rash.  Plan: Will order Fluconazole  IV once followed by  IV daily.  Will order Meropenem 1g IV q8h  Height:  (167.6 cm) Weight: 166 lb 14.2 oz (75.7 kg) IBW/kg (Calculated) : 59.3  Temp (24hrs), Avg:98.4 F (36.9 C), Min:97.8 F (36.6 C), Max:99.3 F (37.4 C)   Recent Labs Lab 05/06/2017 1311 05/02/2017 1358 05/13/2017 1710 05/17/17 0307 05/18/17 1006 05/18/17 1208 05/18/17 1636 05/19/17 0508 05/20/17 0418 05/21/17 0414  WBC 14.4*  --   --   --  17.8*  --   --  21.0* 22.1* 20.9*  CREATININE 0.72  --   --   --  0.73  --   --  0.84 1.00 1.55*  LATICACIDVEN  --  4.1* 2.0* 2.4*  --   --  1.7  --   --   --   VANCOTROUGH  --   --   --   --   --  37*  --   --   --   --     Estimated Creatinine Clearance: 49.2 mL/min (A) (by C-G formula based on SCr of 1.55 mg/dL (H)).    Allergies  Allergen Reactions  . Belladonna Alkaloids Other (See Comments)    Unsure of reaction Unsure of reaction    Antimicrobials this admission: 9/23 Cefepime >> 9/25 9/23 Vancomycin >> 9/25 9/23 Zosyn >> 9/28 9/28 Fluconazole >> 9/29 Meropenem >>  Dose adjustments this admission:  NA  Microbiology results: 9/23 BCx: no growth  9/23 UCx: no growth  9/25 Sputum: moderate yeast with pseduohyphae, culture reincubated for better growth 9/23 MRSA PCR: negative  Thank you for allowing pharmacy to be a part of this patient's care.  Clovia Cuff, PharmD, BCPS 05/21/2017 11:23 AM

## 2017-05-22 ENCOUNTER — Inpatient Hospital Stay: Payer: Medicaid Other

## 2017-05-22 LAB — BASIC METABOLIC PANEL
Anion gap: 4 — ABNORMAL LOW (ref 5–15)
BUN: 77 mg/dL — AB (ref 6–20)
CALCIUM: 7.9 mg/dL — AB (ref 8.9–10.3)
CO2: 24 mmol/L (ref 22–32)
Chloride: 117 mmol/L — ABNORMAL HIGH (ref 101–111)
Creatinine, Ser: 2.14 mg/dL — ABNORMAL HIGH (ref 0.44–1.00)
GFR calc Af Amer: 32 mL/min — ABNORMAL LOW (ref >60)
GFR, EST NON AFRICAN AMERICAN: 27 mL/min — AB (ref >60)
GLUCOSE: 131 mg/dL — AB (ref 65–99)
POTASSIUM: 4.4 mmol/L (ref 3.5–5.1)
Sodium: 145 mmol/L (ref 135–145)

## 2017-05-22 LAB — COMPREHENSIVE METABOLIC PANEL
ALK PHOS: 120 U/L (ref 38–126)
ALT: 81 U/L — AB (ref 14–54)
AST: 74 U/L — AB (ref 15–41)
Albumin: 1.6 g/dL — ABNORMAL LOW (ref 3.5–5.0)
Anion gap: 6 (ref 5–15)
BUN: 71 mg/dL — AB (ref 6–20)
CALCIUM: 7.8 mg/dL — AB (ref 8.9–10.3)
CHLORIDE: 116 mmol/L — AB (ref 101–111)
CO2: 22 mmol/L (ref 22–32)
CREATININE: 2.07 mg/dL — AB (ref 0.44–1.00)
GFR, EST AFRICAN AMERICAN: 33 mL/min — AB (ref >60)
GFR, EST NON AFRICAN AMERICAN: 28 mL/min — AB (ref >60)
Glucose, Bld: 143 mg/dL — ABNORMAL HIGH (ref 65–99)
Potassium: 4.9 mmol/L (ref 3.5–5.1)
Sodium: 144 mmol/L (ref 135–145)
Total Bilirubin: 0.5 mg/dL (ref 0.3–1.2)
Total Protein: 4.1 g/dL — ABNORMAL LOW (ref 6.5–8.1)

## 2017-05-22 LAB — CBC WITH DIFFERENTIAL/PLATELET
Basophils Absolute: 0 10*3/uL (ref 0–0.1)
Basophils Relative: 0 %
EOS ABS: 0 10*3/uL (ref 0–0.7)
EOS PCT: 0 %
HCT: 32.8 % — ABNORMAL LOW (ref 35.0–47.0)
HEMOGLOBIN: 10.8 g/dL — AB (ref 12.0–16.0)
LYMPHS ABS: 1.1 10*3/uL (ref 1.0–3.6)
Lymphocytes Relative: 4 %
MCH: 29.7 pg (ref 26.0–34.0)
MCHC: 33.1 g/dL (ref 32.0–36.0)
MCV: 89.8 fL (ref 80.0–100.0)
MONOS PCT: 8 %
Monocytes Absolute: 2 10*3/uL — ABNORMAL HIGH (ref 0.2–0.9)
NEUTROS PCT: 88 %
Neutro Abs: 22.9 10*3/uL — ABNORMAL HIGH (ref 1.4–6.5)
PLATELETS: 57 10*3/uL — AB (ref 150–440)
RBC: 3.65 MIL/uL — ABNORMAL LOW (ref 3.80–5.20)
RDW: 17.3 % — ABNORMAL HIGH (ref 11.5–14.5)
WBC: 26 10*3/uL — AB (ref 3.6–11.0)

## 2017-05-22 LAB — GLUCOSE, CAPILLARY
GLUCOSE-CAPILLARY: 149 mg/dL — AB (ref 65–99)
Glucose-Capillary: 127 mg/dL — ABNORMAL HIGH (ref 65–99)
Glucose-Capillary: 144 mg/dL — ABNORMAL HIGH (ref 65–99)
Glucose-Capillary: 132 mg/dL — ABNORMAL HIGH (ref 65–99)
Glucose-Capillary: 128 mg/dL — ABNORMAL HIGH (ref 65–99)
Glucose-Capillary: 110 mg/dL — ABNORMAL HIGH (ref 65–99)

## 2017-05-22 MED ORDER — FAMOTIDINE 20 MG PO TABS
20.0000 mg | ORAL_TABLET | Freq: Every day | ORAL | Status: DC
  Administered 2017-05-22 – 2017-05-23 (×2): 20 mg
  Filled 2017-05-22 (×2): qty 1

## 2017-05-22 MED ORDER — FUROSEMIDE 10 MG/ML IJ SOLN
40.0000 mg | Freq: Once | INTRAMUSCULAR | Status: AC
  Administered 2017-05-22: 40 mg via INTRAVENOUS
  Filled 2017-05-22: qty 4

## 2017-05-22 MED ORDER — FREE WATER
200.0000 mL | Freq: Four times a day (QID) | Status: DC
  Administered 2017-05-22 – 2017-05-23 (×3): 200 mL

## 2017-05-22 MED ORDER — CHLORHEXIDINE GLUCONATE 0.12 % MT SOLN
15.0000 mL | Freq: Two times a day (BID) | OROMUCOSAL | Status: DC
  Administered 2017-05-22 – 2017-05-24 (×4): 15 mL via OROMUCOSAL

## 2017-05-22 MED ORDER — HEPARIN SODIUM (PORCINE) 5000 UNIT/ML IJ SOLN: 5000.0000 [IU] | Freq: Three times a day (TID) | INTRAMUSCULAR | Status: DC

## 2017-05-22 NOTE — Progress Notes (Signed)
MEDICATION RELATED CONSULT NOTE   Pharmacy Consult for electrolyte monitoring  Indication: hypokalemia  Allergies  Allergen Reactions  . Zosyn [Piperacillin Sod-Tazobactam So] Rash  . Belladonna Alkaloids Other (See Comments)    Unsure of reaction Unsure of reaction    Patient Measurements: Height:  (167.6 cm) Weight: 174 lb 6.1 oz (79.1 kg) IBW/kg (Calculated) : 59.3  Vital Signs: Temp: 98.1 F (36.7 C) (09/29 0400) Temp Source: Axillary (09/29 0400) BP: 111/58 (09/29 1000) Pulse Rate: 100 (09/29 1000) Intake/Output from previous day: 09/28 0701 - 09/29 0700 In: 2401.1 [I.V.:596.5; ZO/XW:9604.5; IV Piggyback:450] Out: 1225 [Urine:845; Stool:380] Intake/Output from this shift: No intake/output data recorded.  Labs:  Recent Labs  05/20/17 0418 05/20/17 2045 05/21/17 0414 05/21/17 2206 05/22/17 0414  WBC 22.1*  --  20.9*  --  26.0*  HGB 11.8*  --  11.6*  --  10.8*  HCT 36.6  --  35.3  --  32.8*  PLT 58*  --  46*  --  57*  CREATININE 1.00  --  1.55* 1.88* 2.07*  MG  --   --  2.2 2.2  --   PHOS 3.5 3.1  --  2.8  --   ALBUMIN  --   --   --   --  1.6*  PROT  --   --   --   --  4.1*  AST  --   --   --   --  74*  ALT  --   --   --   --  81*  ALKPHOS  --   --   --   --  120  BILITOT  --   --   --   --  0.5   Estimated Creatinine Clearance: 37.6 mL/min (A) (by C-G formula based on SCr of 2.07 mg/dL (H)).   Assessment: Lindsey Allison is a 80 YOF with a known history of myoclonic dystrophy, anxiety, GERD, hydrocephalus that presented to the ED with her care giver because she was not feeling well. Patient was found to have bilateral infiltrates and is being treated for aspiration pneumonia.  Plan:  No additional electrolyte replacement warranted at this time.   Will continue to monitor with am labs.   Pharmacy will continue to follow per consult.   MLS 05/22/2017 10:09 AM

## 2017-05-22 NOTE — Progress Notes (Signed)
Spoke with Dr Bard Herbert regarding patients cdiff order. She has been receiving stool softeners in the past 24 hours. Orders to stop medication and re-assess patient after 24 hours.

## 2017-05-22 NOTE — Progress Notes (Signed)
ET Tube pulled back 2 cm cm and  taped at 21 cm at lip mark

## 2017-05-22 NOTE — Progress Notes (Signed)
Dr Luberta Mutter rounded/assessed patient. No additional orders given.

## 2017-05-22 NOTE — Progress Notes (Signed)
Pharmacy Antibiotic Note  Lindsey Allison is a 41 y.o. female admitted on 05-31-17 with pneumonia and sepsis.  Pharmacy has been consulted for Fluconazole and Meropenem dosing. Patient previously on Zosyn which was discontinued due to worsening rash.  Plan: Patient's renal function has further declined.  Continue Fluconazole  IV Q24hr.  Transition Meropenem to 1g IV Q12hr.   Height:  (167.6 cm) Weight: 174 lb 6.1 oz (79.1 kg) IBW/kg (Calculated) : 59.3  Temp (24hrs), Avg:98.2 F (36.8 C), Min:97.8 F (36.6 C), Max:98.4 F (36.9 C)   Recent Labs Lab 05-31-2017 1358 05/31/2017 1710 05/17/17 0307 05/18/17 1006 05/18/17 1208 05/18/17 1636 05/19/17 0508 05/20/17 0418 05/21/17 0414 05/21/17 2206 05/22/17 0414  WBC  --   --   --  17.8*  --   --  21.0* 22.1* 20.9*  --  26.0*  CREATININE  --   --   --  0.73  --   --  0.84 1.00 1.55* 1.88* 2.07*  LATICACIDVEN 4.1* 2.0* 2.4*  --   --  1.7  --   --   --   --   --   VANCOTROUGH  --   --   --   --  37*  --   --   --   --   --   --     Estimated Creatinine Clearance: 37.6 mL/min (A) (by C-G formula based on SCr of 2.07 mg/dL (H)).    Allergies  Allergen Reactions  . Zosyn [Piperacillin Sod-Tazobactam So] Rash  . Belladonna Alkaloids Other (See Comments)    Unsure of reaction Unsure of reaction    Antimicrobials this admission: 9/23 Cefepime >> 9/25 9/23 Vancomycin >> 9/25 9/23 Zosyn >> 9/28 9/28 Fluconazole >> 9/29 Meropenem >>  Dose adjustments this admission: 9/29 Meropenem transitioned to 1g IV Q12hr.   Microbiology results: 9/23 BCx: staph species 1/4; likely contaminant  9/23 UCx: no growth  9/25 Sputum: moderate candida albicans 9/26 Sputum: few candida albicans  9/23 MRSA PCR: negative  Thank you for allowing pharmacy to be a part of this patient's care.  MLS 05/22/2017 7:14 AM

## 2017-05-22 NOTE — Progress Notes (Signed)
PULMONARY / CRITICAL CARE MEDICINE   Name: Lindsey Allison MRN: [id number] DOB: 1975-09-02    ADMISSION DATE:  05/23/2017  PT PROFILE:   70 F with myotonic dystrophy and mild cognitive impairment admitted 09/23 2 hospitalist service with diagnosis of pneumonia. Transferred to ICU/SDU with worsening hypoxemic respiratory failure requiring intubation. CXR consistent with severe ARDS.  MAJOR EVENTS/TEST RESULTS: 09/23 admitted to hospitalist service. Treated with vancomycin/Zosyn. 09/25 CTA chest: Extensive bilateral airspace consolidation consistent with ARDS 09/25 transferred to ICU/SDU and required intubation. 09/28 worsening renal function with oliguria 09/29 transient AFRVR. Creatinine has stabilized. Urine output improved. Gas exchange improved  INDWELLING DEVICES:: ETT 09/25 >>  L IJ CVL 09/25 >>   MICRO DATA: MRSA PCR 09/23 >> NEG Urine 09/23 >> NEG Resp virus panel 09/25 >> NEG Resp 09/25 >> moderate candida albicans Blood 09/23 >> 1/2 coag neg staph Resp 09/26 >> Few candida albicans  ANTIMICROBIALS:  Vanc 09/23 >> 09/25 Pip-tazo 09/23 >> 09/28 Fluconazole 09/28 >>  Meropenem 09/28 >>   SUBJECTIVE:  RASS -1, -2. Not F/C. Synchronous with ventilator  VITAL SIGNS: BP 116/61   Pulse (!) 102   Temp 98.7 F (37.1 C)   Resp 20   Ht  (1.676 m)   Wt 174 lb 6.1 oz (79.1 kg)   SpO2 98%   BMI 28.15 kg/m   HEMODYNAMICS:    VENTILATOR SETTINGS: Vent Mode: PCV FiO2 (%):  [50 %-80 %] 50 % Set Rate:  [16 bmp] 16 bmp PEEP:  [8 cmH20-15 cmH20] 8 cmH20  INTAKE / OUTPUT: I/O last 3 completed shifts: In: 4206.5 [I.V.:1381.9; Other:10; NG/GT:2364.7; IV Piggyback:450] Out: 1610 [Urine:1230; Stool:380]  PHYSICAL EXAMINATION: General: Intubated, sedated, RASS -2 Neuro: CNs intact, MAEs HEENT: NCAT Cardiovascular: Tachy, regular, no M Lungs: Coarse bilateral rhonchi, no wheezes Abdomen: Distended, soft, no masses, diminished bowel sounds Extremities:  Symmetric pedal and ankle edema Skin: Diffuse erythematous rash  LABS:  BMET  Recent Labs Lab 05/21/17 2206 05/22/17 0414 05/22/17 1500  NA 146* 144 145  K 4.7 4.9 4.4  CL 117* 116* 117*  CO2 BUN 70* 71* 77*  CREATININE 1.88* 2.07* 2.14*  GLUCOSE 154* 143* 131*    Electrolytes  Recent Labs Lab 05/19/17 0508  05/20/17 0418 05/20/17 2045 05/21/17 0414 05/21/17 2206 05/22/17 0414 05/22/17 1500  CALCIUM 7.9*  --  7.1*  --  7.6* 7.8* 7.8* 7.9*  MG 2.2  --   --   --  2.2 2.2  --   --   PHOS 2.5  < > 3.5 3.1  --  2.8  --   --   < > = values in this interval not displayed.  CBC  Recent Labs Lab 05/20/17 0418 05/21/17 0414 05/22/17 0414  WBC 22.1* 20.9* 26.0*  HGB 11.8* 11.6* 10.8*  HCT 36.6 35.3 32.8*  PLT 58* 46* 57*    Coag's No results for input(s): APTT, INR in the last 168 hours.  Sepsis Markers  Recent Labs Lab 05/02/2017 1710 05/17/17 0307 05/18/17 1636 05/19/17 0508 05/20/17 0418 05/21/17 0414  LATICACIDVEN 2.0* 2.4* 1.7  --   --   --   PROCALCITON  --   --   --  0.92 1.11 1.30    ABG  Recent Labs Lab 05/18/17 1503 05/20/17 0442  PHART 7.41 7.26*  PCO2ART 37 43  PO2ART 39* 61*    Liver Enzymes  Recent Labs Lab 05/19/2017 1311 05/22/17 0414  AST 58* 74*  ALT 61* 81*  ALKPHOS 124 120  BILITOT 2.0* 0.5  ALBUMIN 3.1* 1.6*    Cardiac Enzymes  Recent Labs Lab 05/17/17 0042 05/19/17 1355 05/21/17 2206  TROPONINI 0.03* 0.07* 0.03*    Glucose  Recent Labs Lab 05/21/17 1615 05/21/17 1938 05/22/17 0015 05/22/17 0404 05/22/17 0805 05/22/17 1216  GLUCAP 181* 136* 149* 127* 144* 132*    CXR: ARDS pattern   ASSESSMENT / PLAN:  PULMONARY A: Acute hypoxemic respiratory failure ARDS P:   Cont vent support - settings reviewed and/or adjusted Cont vent bundle Daily SBT if/when meets criteria Changed to PCV mode 09/28 to improve ventilator synchrony ARDS protocol PEEP/FiO2 algorithm ordered  09/29  CARDIOVASCULAR A:  Paroxysmal atrial fibrillation P:  Continue diltiazem infusion as needed to maintain HR 85 - 115/min  RENAL A:   AKI Oliguria, resolved Hypervolemia P:   Monitor BMET intermittently Monitor I/Os Correct electrolytes as indicated  Tinney diuresis as permitted by BP and renal function  GASTROINTESTINAL A:   Abdominal distention - improved P:   SUP: Enteral famotidine Continue TF protocol - continue at reduced rate 09/29  HEMATOLOGIC A:   Thrombocytopenia P:  DVT px: SCDs Monitor CBC intermittently Transfuse per usual guidelines   INFECTIOUS A:   Severe sepsis Suspected pneumonia Elevated PCT P:   Monitor temp, WBC count Micro and abx as above  Continue to follow PCT  ENDOCRINE A:   Stress induced hyperglycemia P:   Continue SSI  NEUROLOGIC A:   Mild mental retardation ICU/vent associated discomfort P:   RASS goal: -1, -2 Continue PAD protocol   FAMILY:    CCM time: 35 mins The above time includes time spent in consultation with patient and/or family members and reviewing care plan on multidisciplinary rounds  Billy Fischer, MD PCCM service Mobile [phone] Pager [phone]  05/22/2017, 3:44 PM

## 2017-05-22 NOTE — Progress Notes (Signed)
Persistent desaturations below 88%. Fio2 increased to 60% Peep +10 per ARDS protocol.. sao2 currently 94%

## 2017-05-22 NOTE — Progress Notes (Signed)
Dr Bard Herbert rounded on patient at 9:40 am. Discussed patient having diarrhea-no cdiff collected. Orders to place in system.   Fentanyl turned off at 7:30 AM- Patient has not been able to follow commands. No neuro consult entered. Per Dr Bard Herbert he does not believe that she needs to be evaluated by neuro or have a CT at this time. Orders to place patient back on fentanyl as she became agitated/ raised both hands and attempted to grab ET tube. Dr Bard Herbert was in front of patients room charting.     Patient continues to have a red rash. When bathed this am we reframed from using CHG.

## 2017-05-22 NOTE — Progress Notes (Signed)
[employer] [facility] [place] [facility]                                                                                                                                                                                  Patient Demographics   Lindsey Allison, is a 40 y.o. female, DOB - November 05, 1975, ZOX:[id number]  Admit date - 05/18/17   Admitting Physician Ramonita Lab, MD  Outpatient Primary MD for the patient is Burns, Shellia Cleverly, MD   LOS - 6  Subjective:self extubated this morning, reintubated, now on full vent support  Review of Systems:   CONSTITUTIONAL:limited Due to patient's illness  Vitals:   Vitals:   05/22/17 0900 05/22/17 1000 05/22/17 1100 05/22/17 1108  BP: (!) 108/58 (!) 111/58 108/60   Pulse: 100 100 (!) 101   Resp: (!) 22 20 (!) 22   Temp:   98.7 F (37.1 C)   TempSrc:      SpO2: 97% 94% 93% 93%  Weight:      Height:        Wt Readings from Last 3 Encounters:  05/22/17 79.1 kg (174 lb 6.1 oz)  04/04/17 64.4 kg (142 lb)  01/12/17 65.8 kg (145 lb)     Intake/Output Summary (Last 24 hours) at 05/22/17 1207 Last data filed at 05/22/17 1045  Gross per 24 hour  Intake          1196.41 ml  Output             1405 ml  Net          -208.59 ml    Physical Exam:   GENERAL: intubated  HEAD, EYES, EARS, NOSE AND THROAT: Bilateral conjunctival erythema lip swelling NECK: Supple. There is no jugular venous distention. No bruits, no lymphadenopathy, no thyromegaly.  HEART: Regular rate and rhythm,. No murmurs, no rubs, no clicks.  LUNGS: decreased breath sounds bilaterally ABDOMEN: Soft, flat, nontender, nondistended. Has good bowel sounds. No hepatosplenomegaly appreciated.  EXTREMITIES: No evidence of any cyanosis, clubbing, or peripheral edema.  +2 pedal and radial pulses bilaterally.  NEUROLOGIC: intubated SKIN:  Psych: intubated LN: No inguinal LN enlargement    Antibiotics   Anti-infectives    Start     Dose/Rate Route Frequency  Ordered Stop   05/22/17 1000  fluconazole (DIFLUCAN) IVPB 400 mg     400 mg 100 mL/hr over 120 Minutes Intravenous Every 24 hours 05/21/17 1113     05/21/17 1500  meropenem (MERREM) 1 g in sodium chloride 0.9 % 100 mL IVPB     1 g 200 mL/hr over 30 Minutes Intravenous Every 8 hours 05/21/17 1438     05/21/17  1200  fluconazole (DIFLUCAN) IVPB 800 mg     800 mg 100 mL/hr over 240 Minutes Intravenous  Once 05/21/17 1113 05/21/17 1528   05/18/17 0900  piperacillin-tazobactam (ZOSYN) IVPB 3.375 g  Status:  Discontinued     3.375 g 12.5 mL/hr over 240 Minutes Intravenous Every 8 hours 05/18/17 0851 05/21/17 1433   05/17/17 0200  vancomycin (VANCOCIN) IVPB 750 mg/150 ml premix  Status:  Discontinued     750 mg 150 mL/hr over 60 Minutes Intravenous Every 8 hours 05/02/2017 1857 04/30/2017 1902   05/05/2017 2100  ceFEPIme (MAXIPIME) 2 g in dextrose 5 % 50 mL IVPB  Status:  Discontinued     2 g 100 mL/hr over 30 Minutes Intravenous Every 8 hours 05/17/2017 1740 05/18/17 0851   04/30/2017 2100  vancomycin (VANCOCIN) IVPB 750 mg/150 ml premix  Status:  Discontinued     750 mg 150 mL/hr over 60 Minutes Intravenous Every 8 hours 05/08/2017 1902 05/18/17 0851   04/24/2017 2000  vancomycin (VANCOCIN) IVPB 750 mg/150 ml premix  Status:  Discontinued     750 mg 150 mL/hr over 60 Minutes Intravenous  Once 05/20/2017 1856 04/29/2017 1859   05/17/2017 1416  piperacillin-tazobactam (ZOSYN) 3-0.375 GM/50ML IVPB    Comments:  Allison, Lindsey   : cabinet override      05/04/2017 1416 05/17/17 0229   04/28/2017 1400  vancomycin (VANCOCIN) IVPB 1000 mg/200 mL premix     1,000 mg 200 mL/hr over 60 Minutes Intravenous  Once 05/02/2017 1350 05/22/2017 1643   05/05/2017 1345  vancomycin (VANCOCIN) injection 1 g  Status:  Discontinued     1 g Intravenous  Once 04/25/2017 1345 05/14/2017 1350   04/24/2017 1345  piperacillin-tazobactam (ZOSYN) IVPB 3.375 g     3.375 g 100 mL/hr over 30 Minutes Intravenous  Once 05/11/2017 1345 05/06/2017 1504       Medications   Scheduled Meds: . budesonide (PULMICORT) nebulizer solution  0.25 mg Nebulization Q6H  . chlorhexidine  15 mL Mouth/Throat BID  . famotidine  20 mg Per Tube Daily  . free water  100 mL Per Tube Q6H  . furosemide  40 mg Intravenous Once  . insulin aspart  0-15 Units Subcutaneous Q4H  . ipratropium-albuterol  3 mL Nebulization Q6H  . mouth rinse  15 mL Mouth Rinse 10 times per day  . sodium chloride flush  10-40 mL Intracatheter Q12H   Continuous Infusions: . diltiazem (CARDIZEM) infusion 5 mg/hr (05/22/17 0030)  . feeding supplement (VITAL AF 1.2 CAL) 1,000 mL (05/22/17 0531)  . fentaNYL infusion INTRAVENOUS 125 mcg/hr (05/22/17 1005)  . fluconazole (DIFLUCAN) IV 400 mg (05/22/17 1045)  . meropenem (MERREM) IV Stopped (05/22/17 0601)  . sodium chloride Stopped (05/18/17 1757)   PRN Meds:.acetaminophen, fentaNYL, midazolam, sodium chloride, sodium chloride flush   Data Review:   Micro Results Recent Results (from the past 240 hour(s))  Rapid strep screen     Status: None   Collection Time: 05/14/17  5:40 PM  Result Value Ref Range Status   Streptococcus, Group A Screen (Direct) NEGATIVE NEGATIVE Final    Comment: (NOTE) A Rapid Antigen test may result negative if the antigen level in the sample is below the detection level of this test. The FDA has not cleared this test as a stand-alone test therefore the rapid antigen negative result has reflexed to a Group A Strep culture.   Culture, group A strep     Status: None   Collection Time: 05/14/17  5:40 PM  Result Value Ref Range Status   Specimen Description THROAT  Final   Special Requests NONE Reflexed from [id number]  Final   Culture   Final    NO GROUP A STREP (S.PYOGENES) ISOLATED Performed at [facility], [address] [place], [place] [address]    Report Status 05/17/2017 FINAL  Final  Culture, blood (routine x 2)     Status: Abnormal   Collection Time: May 17, 2017  1:58 PM  Result Value Ref  Range Status   Specimen Description BLOOD LT ARM  Final   Special Requests   Final    BOTTLES DRAWN AEROBIC AND ANAEROBIC Blood Culture results may not be optimal due to an inadequate volume of blood received in culture bottles   Culture  Setup Time   Final    GRAM POSITIVE COCCI ANAEROBIC BOTTLE ONLY CRITICAL RESULT CALLED TO, READ BACK BY AND VERIFIED WITH: LISA KLUTTZ AT 1025 05/17/17 SDR    Culture (A)  Final    STAPHYLOCOCCUS SPECIES (COAGULASE NEGATIVE) THE SIGNIFICANCE OF ISOLATING THIS ORGANISM FROM A SINGLE SET OF BLOOD CULTURES WHEN MULTIPLE SETS ARE DRAWN IS UNCERTAIN. PLEASE NOTIFY THE MICROBIOLOGY DEPARTMENT WITHIN ONE WEEK IF SPECIATION AND SENSITIVITIES ARE REQUIRED. Performed at [facility], [address], [place], [place] [address]    Report Status 05/19/2017 FINAL  Final  Blood Culture ID Panel (Reflexed)     Status: Abnormal   Collection Time: 17-May-2017  1:58 PM  Result Value Ref Range Status   Enterococcus species NOT DETECTED NOT DETECTED Final   Listeria monocytogenes NOT DETECTED NOT DETECTED Final   Staphylococcus species DETECTED (A) NOT DETECTED Final    Comment: Methicillin (oxacillin) resistant coagulase negative staphylococcus. Possible blood culture contaminant (unless isolated from more than one blood culture draw or clinical case suggests pathogenicity). No antibiotic treatment is indicated for blood  culture contaminants. CRITICAL RESULT CALLED TO, READ BACK BY AND VERIFIED WITH: LISA KLUTZ ON 05/17/17 AT 1025 SDR    Staphylococcus aureus NOT DETECTED NOT DETECTED Final   Methicillin resistance DETECTED (A) NOT DETECTED Final    Comment: CRITICAL RESULT CALLED TO, READ BACK BY AND VERIFIED WITH: LISA KLUTZ ON 05/17/17 AT 1025 SDR    Streptococcus species NOT DETECTED NOT DETECTED Final   Streptococcus agalactiae NOT DETECTED NOT DETECTED Final   Streptococcus pneumoniae NOT DETECTED NOT DETECTED Final   Streptococcus pyogenes NOT DETECTED NOT  DETECTED Final   Acinetobacter baumannii NOT DETECTED NOT DETECTED Final   Enterobacteriaceae species NOT DETECTED NOT DETECTED Final   Enterobacter cloacae complex NOT DETECTED NOT DETECTED Final   Escherichia coli NOT DETECTED NOT DETECTED Final   Klebsiella oxytoca NOT DETECTED NOT DETECTED Final   Klebsiella pneumoniae NOT DETECTED NOT DETECTED Final   Proteus species NOT DETECTED NOT DETECTED Final   Serratia marcescens NOT DETECTED NOT DETECTED Final   Haemophilus influenzae NOT DETECTED NOT DETECTED Final   Neisseria meningitidis NOT DETECTED NOT DETECTED Final   Pseudomonas aeruginosa NOT DETECTED NOT DETECTED Final   Candida albicans NOT DETECTED NOT DETECTED Final   Candida glabrata NOT DETECTED NOT DETECTED Final   Candida krusei NOT DETECTED NOT DETECTED Final   Candida parapsilosis NOT DETECTED NOT DETECTED Final   Candida tropicalis NOT DETECTED NOT DETECTED Final  Culture, blood (routine x 2)     Status: None   Collection Time: 05-17-17  3:26 PM  Result Value Ref Range Status   Specimen Description BLOOD RT ARM  Final   Special Requests   Final    BOTTLES DRAWN AEROBIC AND ANAEROBIC Blood Culture results may not be optimal due to an inadequate volume of blood received in culture bottles   Culture NO GROWTH 5 DAYS  Final   Report Status 05/21/2017 FINAL  Final  Urine culture     Status: None   Collection Time: 2017/06/09  4:57 PM  Result Value Ref Range Status   Specimen Description URINE, CLEAN CATCH  Final   Special Requests NONE  Final   Culture   Final    NO GROWTH Performed at [facility], [address], [place], [place] [address]    Report Status 05/18/2017 FINAL  Final  Culture, blood (routine x 2) Call MD if unable to obtain prior to antibiotics being given     Status: None   Collection Time: 06/09/2017  5:54 PM  Result Value Ref Range Status   Specimen Description BLOOD LT WRIST  Final   Special Requests   Final    BOTTLES DRAWN AEROBIC AND ANAEROBIC  Blood Culture adequate volume   Culture NO GROWTH 5 DAYS  Final   Report Status 05/21/2017 FINAL  Final  MRSA PCR Screening     Status: None   Collection Time: June 09, 2017  6:35 PM  Result Value Ref Range Status   MRSA by PCR NEGATIVE NEGATIVE Final    Comment:        The GeneXpert MRSA Assay (FDA approved for NASAL specimens only), is one component of a comprehensive MRSA colonization surveillance program. It is not intended to diagnose MRSA infection nor to guide or monitor treatment for MRSA infections.   Culture, blood (routine x 2) Call MD if unable to obtain prior to antibiotics being given     Status: None   Collection Time: 06/09/2017  7:10 PM  Result Value Ref Range Status   Specimen Description BLOOD RIGHT WRIST  Final   Special Requests   Final    BOTTLES DRAWN AEROBIC AND ANAEROBIC Blood Culture results may not be optimal due to an excessive volume of blood received in culture bottles   Culture NO GROWTH 5 DAYS  Final   Report Status 05/21/2017 FINAL  Final  Respiratory Panel by PCR     Status: None   Collection Time: 05/18/17  2:21 PM  Result Value Ref Range Status   Adenovirus NOT DETECTED NOT DETECTED Final   Coronavirus 229E NOT DETECTED NOT DETECTED Final   Coronavirus HKU1 NOT DETECTED NOT DETECTED Final   Coronavirus NL63 NOT DETECTED NOT DETECTED Final   Coronavirus OC43 NOT DETECTED NOT DETECTED Final   Metapneumovirus NOT DETECTED NOT DETECTED Final   Rhinovirus / Enterovirus NOT DETECTED NOT DETECTED Final   Influenza A NOT DETECTED NOT DETECTED Final   Influenza B NOT DETECTED NOT DETECTED Final   Parainfluenza Virus 1 NOT DETECTED NOT DETECTED Final   Parainfluenza Virus 2 NOT DETECTED NOT DETECTED Final   Parainfluenza Virus 3 NOT DETECTED NOT DETECTED Final   Parainfluenza Virus 4 NOT DETECTED NOT DETECTED Final   Respiratory Syncytial Virus NOT DETECTED NOT DETECTED Final   Bordetella pertussis NOT DETECTED NOT DETECTED Final   Chlamydophila  pneumoniae NOT DETECTED NOT DETECTED Final   Mycoplasma pneumoniae NOT DETECTED NOT DETECTED Final    Comment: Performed at [facility], [address] [place], [place] [address]  Culture, respiratory (NON-Expectorated)     Status: None   Collection Time: 05/18/17  2:21  PM  Result Value Ref Range Status   Specimen Description SPUTUM  Final   Special Requests NONE  Final   Gram Stain   Final    ABUNDANT WBC PRESENT, PREDOMINANTLY MONONUCLEAR ABUNDANT YEAST Performed at [facility], [address] [place], [place] [address]    Culture MODERATE CANDIDA ALBICANS  Final   Report Status 05/21/2017 FINAL  Final  Culture, respiratory (NON-Expectorated)     Status: None   Collection Time: 05/19/17  1:40 AM  Result Value Ref Range Status   Specimen Description SPUTUM  Final   Special Requests NONE  Final   Gram Stain   Final    ABUNDANT WBC PRESENT, PREDOMINANTLY PMN MODERATE YEAST WITH PSEUDOHYPHAE Performed at [facility], [address], [place], [place] [address]    Culture FEW CANDIDA ALBICANS  Final   Report Status 05/21/2017 FINAL  Final    Radiology Reports Dg Chest 2 View  Result Date: June 15, 2017 CLINICAL DATA:  Cough and lethargy EXAM: CHEST  2 VIEW COMPARISON:  01/12/2017 FINDINGS: Cardiac shadow is within normal limits. The lungs are well aerated bilaterally. Patchy infiltrative changes are seen in the left mid and lower lung as well as the right lung base. No sizable effusion is seen. No bony abnormality is noted. Ventricular shunt is noted on the right. IMPRESSION: Bilateral infiltrates as described. Electronically Signed   By: Alcide Clever M.D.   On: 06-15-17 12:37   Dg Abd 1 View  Result Date: 05/21/2017 CLINICAL DATA:  New onset of abdominal distention. EXAM: ABDOMEN - 1 VIEW COMPARISON:  05/20/2017. FINDINGS: The bowel gas pattern is normal. No radio-opaque calculi or other significant radiographic abnormality are seen. Bibasilar lung opacities,  incompletely evaluated. RIGHT-sided ventriculoperitoneal shunt catheter is unchanged. Orogastric tube tip lies within the stomach. Cholecystectomy clips. IMPRESSION: Stable exam. Electronically Signed   By: Elsie Stain M.D.   On: 05/21/2017 16:41   Ct Angio Chest Pe W Or Wo Contrast  Result Date: 05/18/2017 CLINICAL DATA:  Shortness of breath. EXAM: CT ANGIOGRAPHY CHEST WITH CONTRAST TECHNIQUE: Multidetector CT imaging of the chest was performed using the standard protocol during bolus administration of intravenous contrast. Multiplanar CT image reconstructions and MIPs were obtained to evaluate the vascular anatomy. CONTRAST:  75 mL of Isovue 370 intravenously. COMPARISON:  Radiographs of May 17, 2017. FINDINGS: Cardiovascular: Satisfactory opacification of the pulmonary arteries to the segmental level. No evidence of pulmonary embolism. Normal heart size. No pericardial effusion. Mediastinum/Nodes: No enlarged mediastinal, hilar, or axillary lymph nodes. Thyroid gland, trachea, and esophagus demonstrate no significant findings. Lungs/Pleura: No pneumothorax is noted. Large left upper and lower lobe airspace opacities are noted most consistent with pneumonia. Smaller right upper and lower lobe opacities are also noted most consistent with pneumonia. Mild bilateral pleural effusions are noted. Upper Abdomen: No acute abnormality. Musculoskeletal: No chest wall abnormality. No acute or significant osseous findings. Review of the MIP images confirms the above findings. IMPRESSION: No definite evidence of pulmonary embolus. Bilateral upper and lower lobe airspace opacities are noted most consistent with pneumonia. Mild bilateral pleural effusions are noted. Electronically Signed   By: Lupita Raider, M.D.   On: 05/18/2017 12:10   Dg Chest Port 1 View  Result Date: 05/22/2017 CLINICAL DATA:  Respiratory failure EXAM: PORTABLE CHEST 1 VIEW COMPARISON:  05/20/2017 FINDINGS: Cardiomediastinal silhouette  unchanged. An endotracheal tube with tip 1 cm above the carina, NG tube entering the stomach with tip off the field of  view, and left IJ central venous catheter with tip overlying the superior cavoatrial junction again noted. Diffuse bilateral airspace opacities, left-greater-than-right, have slightly improved. No pneumothorax. IMPRESSION: Slightly decreased diffuse bilateral airspace opacities, otherwise unchanged appearance the chest. Electronically Signed   By: Harmon Pier M.D.   On: 05/22/2017 07:07   Dg Chest Port 1 View  Result Date: 05/20/2017 CLINICAL DATA:  Respiratory failure. EXAM: PORTABLE CHEST 1 VIEW COMPARISON:  05/19/2017. FINDINGS: Endotracheal tube, NG tube, left IJ line stable position. Heart size normal. Diffuse bilateral pulmonary infiltrates/edema again noted. No interim change. Small left pleural effusion cannot be excluded. No pneumothorax. IMPRESSION: 1. Lines and tubes in stable position. 2. Diffuse bilateral pulmonary infiltrates/edema again noted. No interim change. Small left pleural effusion cannot be excluded. Electronically Signed   By: Maisie Fus  Register   On: 05/20/2017 06:34   Dg Chest Port 1 View  Result Date: 05/19/2017 CLINICAL DATA:  The EXAM: PORTABLE CHEST 1 VIEW COMPARISON:  Prior radiograph from 05/18/2017. FINDINGS: Endotracheal tube in place with tip positioned 19 mm above the carina. Enteric tube courses in the abdomen. Left IJ approach centra venous catheter in place with tip in the proximal right atrium, stable. Cardiac and mediastinal silhouettes are unchanged. Patchy multifocal opacities with consolidation and air bronchograms again seen involving the bilateral lungs, left greater than right. There is increased opacity at the left lung base as compared to previous, with some improved aeration at the left upper lobe. Right lung relatively similar in appearance. No pleural effusion. No appreciable pneumothorax. Osseous structures unchanged. IMPRESSION: 1. Support  apparatus in satisfactory position as above. 2. Persistent patchy multifocal airspace opacities, left greater than right, concerning for multilobar pneumonia. As compared to previous, there is worsened opacity at the left lung base, with slightly improved aeration at the left upper lung. Electronically Signed   By: Rise Mu M.D.   On: 05/19/2017 15:54   Dg Chest Port 1 View  Result Date: 05/18/2017 CLINICAL DATA:  Central line placement EXAM: PORTABLE CHEST 1 VIEW COMPARISON:  05/17/2017 FINDINGS: Left IJ approach central line catheter tip is seen at the cavoatrial junction. No pneumothorax. The tip of an endotracheal tube is 3.1 cm above the carina. Gastric tube extends below the left hemidiaphragm into the expected location of the stomach. VP shunt tubing projects over the right hemithorax and extends into the abdomen. The tip is excluded on this study. Patchy pneumonic consolidations with air bronchograms are seen in both upper lobes, lingula and left lower lobe. Heart size is normal. The aorta is obscured by the adjacent pneumonia. IMPRESSION: 1. Satisfactory support line and tube positions. 2. Left IJ central line catheter seen with tip at the cavoatrial junction. No pneumothorax. 3. Patchy multilobar pneumonia with air bronchograms. Electronically Signed   By: Tollie Eth M.D.   On: 05/18/2017 19:47   Dg Chest Port 1 View  Result Date: 05/17/2017 CLINICAL DATA:  Cough EXAM: PORTABLE CHEST 1 VIEW COMPARISON:  Chest radiograph 05/23/2017 at 12:07 p.m. FINDINGS: Marked worsening of consolidative opacity in the left mid lung compared to the earlier study. Aeration at the right lung base is improved. No pneumothorax or pleural effusion. IMPRESSION: Marked worsening of left mid lung consolidation, likely infection or aspiration. Electronically Signed   By: Deatra Robinson M.D.   On: 05/17/2017 02:18   Dg Abd Portable 1v  Result Date: 05/20/2017 CLINICAL DATA:  Orogastric tube placement. EXAM:  PORTABLE ABDOMEN - 1 VIEW COMPARISON:  Radiographs of May 18, 2017.  FINDINGS: The bowel gas pattern is normal. No radio-opaque calculi or other significant radiographic abnormality are seen. Status post cholecystectomy. Distal tip of orogastric tube is seen in proximal stomach. 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 IMPRESSION: No evidence of bowel obstruction or ileus. Distal tip of orogastric tube is seen in proximal stomach. Electronically Signed   By: Lupita Raider, M.D.   On: 05/20/2017 14:39   Dg Abd Portable 1v  Result Date: 05/18/2017 CLINICAL DATA:  OG tube placement EXAM: PORTABLE ABDOMEN - 1 VIEW COMPARISON:  None. FINDINGS: The tip and side port of a gastric tube are seen below the left hemidiaphragm in the expected location of the stomach. The tip however appears to coiled back toward the GE junction band is approximately 2 cm from the GE junction. Scattered gas containing small and large bowel loops without obstructive pattern. No free air. Contrast is seen within both renal collecting systems likely from recent administration of IV contrast. VP shunt tip projects over the left lower quadrant. Cholecystectomy clips are seen in the right upper quadrant. IMPRESSION: 1. Gastric tube tip and side-port are seen in the expected location of stomach however the tip projects back toward the GE junction and is seen approximately 2 cm from the expected location of the gastroesophageal juncture. 2. VP shunt tubing tip is seen in the left lower quadrant of the abdomen. Electronically Signed   By: Tollie Eth M.D.   On: 05/18/2017 19:44     CBC  Recent Labs Lab May 29, 2017 1311 05/18/17 1006 05/19/17 0508 05/20/17 0418 05/21/17 0414 05/22/17 0414  WBC 14.4* 17.8* 21.0* 22.1* 20.9* 26.0*  HGB 13.5 12.7 11.8* 11.8* 11.6* 10.8*  HCT 41.2 38.9 36.3 36.6 35.3 32.8*  PLT 98* 79* 80* 58* 46* 57*  MCV 88.1 87.8 88.0 87.9 89.5 89.8  MCH 28.8 28.7 28.5 28.4 29.5 29.7  MCHC  32.7 32.7 32.4 32.3 32.9 33.1  RDW 15.3* 15.5* 15.7* 16.4* 16.4* 17.3*  LYMPHSABS 1.1 1.0  --  0.4*  --  1.1  MONOABS 0.4 0.8  --  1.1*  --  2.0*  EOSABS 0.1 0.0  --  0.0  --  0.0  BASOSABS 0.0 0.1  --  0.0  --  0.0    Chemistries   Recent Labs Lab 29-May-2017 1311  05/19/17 0508 05/19/17 1821 05/20/17 0418 05/21/17 0414 05/21/17 2206 05/22/17 0414  NA 138  < > 148*  --  144 143 146* 144  K 4.0  < > 3.0* 3.5 3.5 3.2* 4.7 4.9  CL 102  < > 120*  --  117* 114* 117* 116*  CO2 25  < > 22  --  22 22 23 22   GLUCOSE 97  < > 183*  --  164* 199* 154* 143*  BUN 16  < > 24*  --  37* 56* 70* 71*  CREATININE 0.72  < > 0.84  --  1.00 1.55* 1.88* 2.07*  CALCIUM 8.6*  < > 7.9*  --  7.1* 7.6* 7.8* 7.8*  MG  --   --  2.2  --   --  2.2 2.2  --   AST 58*  --   --   --   --   --   --  74*  ALT 61*  --   --   --   --   --   --  81*  ALKPHOS 124  --   --   --   --   --   --  120  BILITOT 2.0*  --   --   --   --   --   --  0.5  < > = values in this interval not displayed. ------------------------------------------------------------------------------------------------------------------ estimated creatinine clearance is 37.6 mL/min (A) (by C-G formula based on SCr of 2.07 mg/dL (H)). ------------------------------------------------------------------------------------------------------------------ No results for input(s): HGBA1C in the last 72 hours. ------------------------------------------------------------------------------------------------------------------ No results for input(s): CHOL, HDL, LDLCALC, TRIG, CHOLHDL, LDLDIRECT in the last 72 hours. ------------------------------------------------------------------------------------------------------------------  Recent Labs  05/19/17 1821  TSH 0.438   ------------------------------------------------------------------------------------------------------------------ No results for input(s): VITAMINB12, FOLATE, FERRITIN, TIBC, IRON, RETICCTPCT in  the last 72 hours.  Coagulation profile No results for input(s): INR, PROTIME in the last 168 hours.  No results for input(s): DDIMER in the last 72 hours.  Cardiac Enzymes  Recent Labs Lab 05/17/17 0042 05/19/17 1355 05/21/17 2206  TROPONINI 0.03* 0.07* 0.03*   ------------------------------------------------------------------------------------------------------------------ Invalid input(s): POCBNP    Assessment & Plan  Lindsey Allison  is a 41 y.o. female with a known history of myoclonic dystrophy, anxiety, GERD,hydrocephalus,poor historian, is brought into the ED by her care giver as patient is not feeling well Patient was seen at urgent care on Friday, throat cultures were obtained and patient was given a prescription for azithromycin for possible strep throat,. Both conjunctiva are red and patient is brought into the  Emergency department.Chest x-ray has revealed bilateral infiltrates  #Acute respiratory failure Secondary to severe pneumonia: Seen by Dr. Sampson Goon, antibiotics are changed yesterday Continue IV meropenem and fluconazole Ventilator support for now  myotonic dystrophy prognosis is not very good  # sepsis from aspiration pneumonia  Continue therapy with IV antibiotics Positive blood cultures likely contamination Sputum showing Candida. CBC of 26 today. 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 Patient on Lasix 40 mg IV for 2 doses that he received yesterday.  Diarrhea: Stop stool softeners per 24 hours, check stool for C. difficile.     Code Status Orders        Start     Ordered   05/15/2017 1740  Full code  Continuous     05/07/2017 1740    Code Status History    Date Active Date Inactive Code Status Order ID Comments User Context   This patient has a current code status but no historical code status.           Consults   none   DVT Prophylaxis  Lovenox    Lab Results  Component Value Date   PLT 57 (L) 05/22/2017     Time Spent in minutes   35 minutes spent  [facility] M.D on 05/22/2017 at 12:07 PM  Between 7am to 6pm - Pager - [phone]  After 6pm go to [email or web address] - password [email or web address] [facility]  [facility] [place] [facility]   Office  [phone]

## 2017-05-23 ENCOUNTER — Other Ambulatory Visit: Payer: Self-pay

## 2017-05-23 ENCOUNTER — Inpatient Hospital Stay: Payer: Medicaid Other

## 2017-05-23 LAB — BASIC METABOLIC PANEL
Anion gap: 7 (ref 5–15)
BUN: 74 mg/dL — AB (ref 6–20)
CALCIUM: 7.8 mg/dL — AB (ref 8.9–10.3)
CO2: 23 mmol/L (ref 22–32)
Chloride: 116 mmol/L — ABNORMAL HIGH (ref 101–111)
Creatinine, Ser: 2.12 mg/dL — ABNORMAL HIGH (ref 0.44–1.00)
GFR calc Af Amer: 32 mL/min — ABNORMAL LOW (ref >60)
GFR, EST NON AFRICAN AMERICAN: 28 mL/min — AB (ref >60)
GLUCOSE: 131 mg/dL — AB (ref 65–99)
POTASSIUM: 4.3 mmol/L (ref 3.5–5.1)
SODIUM: 146 mmol/L — AB (ref 135–145)

## 2017-05-23 LAB — GLUCOSE, CAPILLARY
GLUCOSE-CAPILLARY: 92 mg/dL (ref 65–99)
GLUCOSE-CAPILLARY: 93 mg/dL (ref 65–99)
Glucose-Capillary: 122 mg/dL — ABNORMAL HIGH (ref 65–99)
Glucose-Capillary: 112 mg/dL — ABNORMAL HIGH (ref 65–99)
Glucose-Capillary: 137 mg/dL — ABNORMAL HIGH (ref 65–99)
Glucose-Capillary: 112 mg/dL — ABNORMAL HIGH (ref 65–99)
Glucose-Capillary: 103 mg/dL — ABNORMAL HIGH (ref 65–99)

## 2017-05-23 LAB — BLOOD GAS, ARTERIAL
ACID-BASE DEFICIT: 6.8 mmol/L — AB (ref 0.0–2.0)
BICARBONATE: 20.9 mmol/L (ref 20.0–28.0)
FIO2: 1
MECHVT: 500 mL
Mechanical Rate: 16
O2 SAT: 98.5 %
PCO2 ART: 51 mmHg — AB (ref 32.0–48.0)
PO2 ART: 136 mmHg — AB (ref 83.0–108.0)
Patient temperature: 37
pH, Arterial: 7.22 — ABNORMAL LOW (ref 7.350–7.450)

## 2017-05-23 LAB — COMPREHENSIVE METABOLIC PANEL
ALK PHOS: 121 U/L (ref 38–126)
ALT: 77 U/L — AB (ref 14–54)
AST: 66 U/L — AB (ref 15–41)
Albumin: 1.5 g/dL — ABNORMAL LOW (ref 3.5–5.0)
Anion gap: 9 (ref 5–15)
BUN: 82 mg/dL — AB (ref 6–20)
CALCIUM: 7.9 mg/dL — AB (ref 8.9–10.3)
CO2: 21 mmol/L — AB (ref 22–32)
Chloride: 116 mmol/L — ABNORMAL HIGH (ref 101–111)
Creatinine, Ser: 2.4 mg/dL — ABNORMAL HIGH (ref 0.44–1.00)
GFR, EST AFRICAN AMERICAN: 28 mL/min — AB (ref >60)
GFR, EST NON AFRICAN AMERICAN: 24 mL/min — AB (ref >60)
Glucose, Bld: 122 mg/dL — ABNORMAL HIGH (ref 65–99)
Potassium: 4.7 mmol/L (ref 3.5–5.1)
SODIUM: 146 mmol/L — AB (ref 135–145)
TOTAL PROTEIN: 4.1 g/dL — AB (ref 6.5–8.1)
Total Bilirubin: 0.7 mg/dL (ref 0.3–1.2)

## 2017-05-23 LAB — PROCALCITONIN: PROCALCITONIN: 0.58 ng/mL

## 2017-05-23 LAB — CBC
HCT: 33.4 % — ABNORMAL LOW (ref 35.0–47.0)
HEMATOCRIT: 31.9 % — AB (ref 35.0–47.0)
Hemoglobin: 10.6 g/dL — ABNORMAL LOW (ref 12.0–16.0)
Hemoglobin: 10.6 g/dL — ABNORMAL LOW (ref 12.0–16.0)
MCH: 29.7 pg (ref 26.0–34.0)
MCH: 28.6 pg (ref 26.0–34.0)
MCHC: 33.4 g/dL (ref 32.0–36.0)
MCHC: 31.7 g/dL — ABNORMAL LOW (ref 32.0–36.0)
MCV: 88.9 fL (ref 80.0–100.0)
MCV: 90.5 fL (ref 80.0–100.0)
PLATELETS: 71 10*3/uL — AB (ref 150–440)
PLATELETS: 88 10*3/uL — AB (ref 150–440)
RBC: 3.59 MIL/uL — ABNORMAL LOW (ref 3.80–5.20)
RBC: 3.7 MIL/uL — ABNORMAL LOW (ref 3.80–5.20)
RDW: 17.2 % — AB (ref 11.5–14.5)
RDW: 18.2 % — AB (ref 11.5–14.5)
WBC: 21.7 10*3/uL — AB (ref 3.6–11.0)
WBC: 23.4 10*3/uL — AB (ref 3.6–11.0)

## 2017-05-23 LAB — APTT: aPTT: 28 seconds (ref 24–36)

## 2017-05-23 LAB — TROPONIN I: TROPONIN I: 0.04 ng/mL — AB (ref <0.03)

## 2017-05-23 LAB — TRIGLYCERIDES: TRIGLYCERIDES: 126 mg/dL (ref <150)

## 2017-05-23 LAB — PROTIME-INR
INR: 1.33
Prothrombin Time: 16.4 seconds — ABNORMAL HIGH (ref 11.4–15.2)

## 2017-05-23 LAB — FIBRINOGEN: Fibrinogen: 506 mg/dL — ABNORMAL HIGH (ref 210–475)

## 2017-05-23 LAB — FIBRIN DERIVATIVES D-DIMER (ARMC ONLY)

## 2017-05-23 LAB — MAGNESIUM: Magnesium: 2.3 mg/dL (ref 1.7–2.4)

## 2017-05-23 LAB — PHOSPHORUS: PHOSPHORUS: 4.5 mg/dL (ref 2.5–4.6)

## 2017-05-23 MED ORDER — SUCCINYLCHOLINE CHLORIDE 20 MG/ML IJ SOLN
100.0000 mg | Freq: Once | INTRAMUSCULAR | Status: AC
  Administered 2017-05-23: 100 mg via INTRAVENOUS

## 2017-05-23 MED ORDER — MIDAZOLAM HCL 2 MG/2ML IJ SOLN
2.0000 mg | INTRAMUSCULAR | Status: DC | PRN
  Administered 2017-05-23 – 2017-05-24 (×2): 4 mg via INTRAVENOUS
  Filled 2017-05-23: qty 4

## 2017-05-23 MED ORDER — FUROSEMIDE 10 MG/ML IJ SOLN
40.0000 mg | Freq: Once | INTRAMUSCULAR | Status: AC
  Administered 2017-05-23: 40 mg via INTRAVENOUS
  Filled 2017-05-23: qty 4

## 2017-05-23 MED ORDER — ETOMIDATE 2 MG/ML IV SOLN
30.0000 mg | Freq: Once | INTRAVENOUS | Status: AC
  Administered 2017-05-23: 30 mg via INTRAVENOUS

## 2017-05-23 MED ORDER — METRONIDAZOLE IN NACL 5-0.79 MG/ML-% IV SOLN
500.0000 mg | Freq: Three times a day (TID) | INTRAVENOUS | Status: DC
  Administered 2017-05-23 – 2017-05-24 (×2): 500 mg via INTRAVENOUS
  Filled 2017-05-23 (×6): qty 100

## 2017-05-23 MED ORDER — FENTANYL 2500MCG IN NS 250ML (10MCG/ML) PREMIX INFUSION
25.0000 ug/h | INTRAVENOUS | Status: DC
  Administered 2017-05-24: 250 ug/h via INTRAVENOUS
  Filled 2017-05-23: qty 250

## 2017-05-23 MED ORDER — MIDAZOLAM HCL 2 MG/2ML IJ SOLN
2.0000 mg | INTRAMUSCULAR | Status: DC | PRN
  Filled 2017-05-23: qty 4

## 2017-05-23 MED ORDER — HYDROCORTISONE 1 % EX CREA
TOPICAL_CREAM | Freq: Two times a day (BID) | CUTANEOUS | Status: DC
  Administered 2017-05-23 – 2017-05-24 (×2): via TOPICAL
  Filled 2017-05-23: qty 28

## 2017-05-23 MED ORDER — PROPOFOL 1000 MG/100ML IV EMUL
5.0000 ug/kg/min | INTRAVENOUS | Status: DC
  Administered 2017-05-23: 30 ug/kg/min via INTRAVENOUS
  Administered 2017-05-24: 45 ug/kg/min via INTRAVENOUS
  Administered 2017-05-24 (×2): 60 ug/kg/min via INTRAVENOUS
  Filled 2017-05-23 (×4): qty 100

## 2017-05-23 MED ORDER — NOREPINEPHRINE BITARTRATE 1 MG/ML IV SOLN
0.0000 ug/min | INTRAVENOUS | Status: DC
  Administered 2017-05-23: 5 ug/min via INTRAVENOUS
  Administered 2017-05-24: 65 ug/min via INTRAVENOUS
  Filled 2017-05-23: qty 16
  Filled 2017-05-23: qty 12

## 2017-05-23 MED ORDER — SODIUM CHLORIDE 0.9 % IV BOLUS (SEPSIS)
1000.0000 mL | Freq: Once | INTRAVENOUS | Status: AC
  Administered 2017-05-23: 1000 mL via INTRAVENOUS

## 2017-05-23 MED ORDER — METOPROLOL TARTRATE 25 MG PO TABS
25.0000 mg | ORAL_TABLET | Freq: Two times a day (BID) | ORAL | Status: DC
  Administered 2017-05-23: 25 mg
  Filled 2017-05-23: qty 1

## 2017-05-23 MED ORDER — FREE WATER
300.0000 mL | Freq: Four times a day (QID) | Status: DC
  Administered 2017-05-23 (×2): 300 mL

## 2017-05-23 MED ORDER — METOPROLOL TARTRATE 25 MG/10 ML ORAL SUSPENSION
25.0000 mg | Freq: Two times a day (BID) | ORAL | Status: DC
  Filled 2017-05-23: qty 10

## 2017-05-23 MED ORDER — METOPROLOL TARTRATE 5 MG/5ML IV SOLN
2.5000 mg | INTRAVENOUS | Status: DC | PRN
  Administered 2017-05-23: 5 mg via INTRAVENOUS
  Filled 2017-05-23: qty 5

## 2017-05-23 NOTE — Plan of Care (Signed)
Problem: Pain Managment: Goal: General experience of comfort will improve Outcome: Progressing Fentanyl increased to 150, a couple of bolus PRN's given throughout shift.  Problem: Physical Regulation: Goal: Ability to maintain clinical measurements within normal limits will improve Outcome: Progressing VSS Goal: Will remain free from infection Outcome: Not Progressing WBC still elevated this AM  Problem: Skin Integrity: Goal: Risk for impaired skin integrity will decrease Outcome: Not Progressing Erythema remains, blisters and swelling.  Problem: Fluid Volume: Goal: Ability to maintain a balanced intake and output will improve Outcome: Progressing UOP average 13mL/h  Problem: Nutrition: Goal: Adequate nutrition will be maintained Outcome: Progressing Pep up protocol followed  Problem: Bowel/Gastric: Goal: Will not experience complications related to bowel motility Outcome: Progressing flexi seal remains, but output has slowed since d/c'ing senna

## 2017-05-23 NOTE — Progress Notes (Signed)
PULMONARY / CRITICAL CARE MEDICINE   Name: Lindsey Allison MRN: [id number] DOB: 05-Apr-1976    ADMISSION DATE:  05/21/2017  PT PROFILE:   61 F with myotonic dystrophy and mild cognitive impairment admitted 09/23 2 hospitalist service with diagnosis of pneumonia. Transferred to ICU/SDU with worsening hypoxemic respiratory failure requiring intubation. CXR consistent with severe ARDS.  MAJOR EVENTS/TEST RESULTS: 09/23 admitted to hospitalist service. Treated with vancomycin/Zosyn. 09/25 CTA chest: Extensive bilateral airspace consolidation consistent with ARDS 09/25 transferred to ICU/SDU and required intubation. 09/28 worsening renal function with oliguria 09/29 transient AFRVR. Creatinine has stabilized. Urine output improved. Gas exchange improved  INDWELLING DEVICES:: ETT 09/25 >>  L IJ CVL 09/25 >>   MICRO DATA: MRSA PCR 09/23 >> NEG Urine 09/23 >> NEG Resp virus panel 09/25 >> NEG Resp 09/25 >> moderate candida albicans Blood 09/23 >> 1/2 coag neg staph Resp 09/26 >> Few candida albicans  ANTIMICROBIALS:  Vanc 09/23 >> 09/25 Pip-tazo 09/23 >> 09/28 Fluconazole 09/28 >>  Meropenem 09/28 >>   SUBJECTIVE:  RASS -3. Not F/C. Synchronous with ventilator  VITAL SIGNS: BP 116/60   Pulse 96   Temp 99 F (37.2 C) (Axillary)   Resp (!) 29   Ht  (1.676 m)   Wt 174 lb 6.1 oz (79.1 kg)   SpO2 92%   BMI 28.15 kg/m   HEMODYNAMICS:    VENTILATOR SETTINGS: Vent Mode: PCV FiO2 (%):  [50 %-60 %] 60 % Set Rate:  [16 bmp] 16 bmp PEEP:  [8 cmH20-10 cmH20] 10 cmH20  INTAKE / OUTPUT: I/O last 3 completed shifts: In: 2278.8 [I.V.:817.2; Other:45; NG/GT:1216.7; IV Piggyback:200] Out: 3340 [Urine:2785; Stool:555]  PHYSICAL EXAMINATION: General: Intubated, sedated, RASS -3 Neuro: CNs intact, MAEs HEENT: NCAT Cardiovascular: Tachy, regular, no M Lungs: Coarse bilateral rhonchi, no wheezes Abdomen: Distended, soft, no masses, diminished bowel sounds Extremities:  Improving symmetric pedal and ankle edema Skin: Diffuse erythematous rash - less severe but persists  LABS:  BMET  Recent Labs Lab 05/22/17 0414 05/22/17 1500 05/23/17 0527  NA 144 145 146*  K 4.9 4.4 4.3  CL 116* 117* 116*  CO2 BUN 71* 77* 74*  CREATININE 2.07* 2.14* 2.12*  GLUCOSE 143* 131* 131*    Electrolytes  Recent Labs Lab 05/19/17 0508  05/20/17 0418 05/20/17 2045 05/21/17 0414 05/21/17 2206 05/22/17 0414 05/22/17 1500 05/23/17 0527  CALCIUM 7.9*  --  7.1*  --  7.6* 7.8* 7.8* 7.9* 7.8*  MG 2.2  --   --   --  2.2 2.2  --   --   --   PHOS 2.5  < > 3.5 3.1  --  2.8  --   --   --   < > = values in this interval not displayed.  CBC  Recent Labs Lab 05/21/17 0414 05/22/17 0414 05/23/17 0527  WBC 20.9* 26.0* 21.7*  HGB 11.6* 10.8* 10.6*  HCT 35.3 32.8* 31.9*  PLT 46* 57* 71*    Coag's No results for input(s): APTT, INR in the last 168 hours.  Sepsis Markers  Recent Labs Lab 05/17/2017 1710 05/17/17 0307 05/18/17 1636  05/20/17 0418 05/21/17 0414 05/23/17 0527  LATICACIDVEN 2.0* 2.4* 1.7  --   --   --   --   PROCALCITON  --   --   --   < > 1.11 1.30 0.58  < > = values in this interval not displayed.  ABG  Recent Labs Lab 05/18/17 1503 05/20/17 0442  PHART 7.41 7.26*  PCO2ART 37 43  PO2ART 39* 61*    Liver Enzymes  Recent Labs Lab 05/22/17 0414  AST 74*  ALT 81*  ALKPHOS 120  BILITOT 0.5  ALBUMIN 1.6*    Cardiac Enzymes  Recent Labs Lab 05/17/17 0042 05/19/17 1355 05/21/17 2206  TROPONINI 0.03* 0.07* 0.03*    Glucose  Recent Labs Lab 05/22/17 1549 05/22/17 1958 05/23/17 0003 05/23/17 0506 05/23/17 0757 05/23/17 1155  GLUCAP 128* 110* 93 122* 112* 137*    CXR: NSC ARDS pattern   ASSESSMENT / PLAN:  PULMONARY A: Acute hypoxemic respiratory failure ARDS P:   Cont vent support - settings reviewed and/or adjusted Cont vent bundle Daily SBT if/when meets criteria Changed to PCV mode 09/28  to improve ventilator synchrony ARDS protocol PEEP/FiO2 algorithm initiated 09/29  CARDIOVASCULAR A:  Paroxysmal atrial fibrillation Sinus tachycardia P:  Initiate scheduled metoprolol 09/30 Low dose IV metoprolol to maintain HR < 115/min  RENAL A:   AKI Oliguria, resolved Hypervolemia Hypernatremia P:   Monitor BMET intermittently Monitor I/Os Correct electrolytes as indicated  Continue diuresis as permitted by BP and renal function  Furosemide X 1 09/30 Free water increased 09/30  GASTROINTESTINAL A:   Abdominal distention - improved P:   SUP: Enteral famotidine Continue TF protocol - continue at reduced rate 09/30  Consider advancing rate 10/01  HEMATOLOGIC A:   Thrombocytopenia - stable to improving P:  DVT px: SCDs Monitor CBC intermittently Transfuse per usual guidelines   INFECTIOUS A:   Severe sepsis Pneumonia, NOS Elevated PCT - improving P:   Monitor temp, WBC count Micro and abx as above   ENDOCRINE A:   Stress induced hyperglycemia P:   Continue SSI  NEUROLOGIC A:   Mild mental retardation ICU/vent associated discomfort P:   RASS goal: -1, -2 Continue PAD protocol   FAMILY: She has no family involved in her life and is a ward of the state. I spoke with her guardian 09/28. She understands that this might require trach/G tube/SNF   CCM time: 35 mins The above time includes time spent in consultation with patient and/or family members and reviewing care plan on multidisciplinary rounds  Billy Fischer, MD PCCM service Mobile [phone] Pager [phone]  05/23/2017, 2:48 PM

## 2017-05-23 NOTE — Progress Notes (Signed)
MEDICATION RELATED CONSULT NOTE   Pharmacy Consult for electrolyte monitoring  Indication: hypokalemia  Allergies  Allergen Reactions  . Zosyn [Piperacillin Sod-Tazobactam So] Rash  . Belladonna Alkaloids Other (See Comments)    Unsure of reaction Unsure of reaction    Patient Measurements: Height:  (167.6 cm) Weight: 174 lb 6.1 oz (79.1 kg) IBW/kg (Calculated) : 59.3  Vital Signs: Temp: 99.2 F (37.3 C) (09/30 0800) Temp Source: Oral (09/30 0800) BP: 128/57 (09/30 0900) Pulse Rate: 111 (09/30 0900) Intake/Output from previous day: 09/29 0701 - 09/30 0700 In: 1488.8 [I.V.:467.1; NG/GT:776.7; IV Piggyback:200] Out: 2510 [Urine:2160; Stool:350] Intake/Output from this shift: Total I/O In: 152.5 [I.V.:32.5; NG/GT:120] Out: -   Labs:  Recent Labs  05/20/17 2045  05/21/17 0414 05/21/17 2206 05/22/17 0414 05/22/17 1500 05/23/17 0527  WBC  --   --  20.9*  --  26.0*  --  21.7*  HGB  --   --  11.6*  --  10.8*  --  10.6*  HCT  --   --  35.3  --  32.8*  --  31.9*  PLT  --   --  46*  --  57*  --  71*  CREATININE  --   < > 1.55* 1.88* 2.07* 2.14* 2.12*  MG  --   --  2.2 2.2  --   --   --   PHOS 3.1  --   --  2.8  --   --   --   ALBUMIN  --   --   --   --  1.6*  --   --   PROT  --   --   --   --  4.1*  --   --   AST  --   --   --   --  74*  --   --   ALT  --   --   --   --  81*  --   --   ALKPHOS  --   --   --   --  120  --   --   BILITOT  --   --   --   --  0.5  --   --   < > = values in this interval not displayed. Estimated Creatinine Clearance: 36.7 mL/min (A) (by C-G formula based on SCr of 2.12 mg/dL (H)).   Assessment: Lindsey Allison is a 73 YOF with a known history of myoclonic dystrophy, anxiety, GERD, hydrocephalus that presented to the ED with her care giver because she was not feeling well. Patient was found to have bilateral infiltrates and is being treated for aspiration pneumonia.  Plan:  Patient with mild hypernatremia. CCM ordered free water flushes  Q6hr and furosemide  IV x 1.   No additional electrolyte replacement warranted at this time.   Will continue to monitor with am labs.   Pharmacy will continue to follow per consult.   MLS 05/23/2017 10:11 AM

## 2017-05-23 NOTE — Progress Notes (Signed)
Pharmacy Antibiotic Note  Lindsey Allison is a 41 y.o. female admitted on 06-13-2017 with pneumonia and sepsis.  Pharmacy has been consulted for Fluconazole and Meropenem dosing. Patient previously on Zosyn which was discontinued due to worsening rash.  Plan: Continue Fluconazole  IV Q24hr.  Continue Meropenem to 1g IV Q12hr.   Height:  (167.6 cm) Weight: 174 lb 6.1 oz (79.1 kg) IBW/kg (Calculated) : 59.3  Temp (24hrs), Avg:98.3 F (36.8 C), Min:97.3 F (36.3 C), Max:99.2 F (37.3 C)   Recent Labs Lab 2017/06/13 1358 06/13/2017 1710 05/17/17 0307  05/18/17 1208 05/18/17 1636 05/19/17 0508 05/20/17 0418 05/21/17 0414 05/21/17 2206 05/22/17 0414 05/22/17 1500 05/23/17 0527  WBC  --   --   --   < >  --   --  21.0* 22.1* 20.9*  --  26.0*  --  21.7*  CREATININE  --   --   --   < >  --   --  0.84 1.00 1.55* 1.88* 2.07* 2.14* 2.12*  LATICACIDVEN 4.1* 2.0* 2.4*  --   --  1.7  --   --   --   --   --   --   --   VANCOTROUGH  --   --   --   --  37*  --   --   --   --   --   --   --   --   < > = values in this interval not displayed.  Estimated Creatinine Clearance: 36.7 mL/min (A) (by C-G formula based on SCr of 2.12 mg/dL (H)).    Allergies  Allergen Reactions  . Zosyn [Piperacillin Sod-Tazobactam So] Rash  . Belladonna Alkaloids Other (See Comments)    Unsure of reaction Unsure of reaction    Antimicrobials this admission: 9/23 Cefepime >> 9/25 9/23 Vancomycin >> 9/25 9/23 Zosyn >> 9/28 9/28 Fluconazole >> 9/29 Meropenem >>  Dose adjustments this admission: 9/29 Meropenem transitioned to 1g IV Q12hr.   Microbiology results: 9/23 BCx: staph species 1/4; likely contaminant  9/23 UCx: no growth  9/25 Sputum: moderate candida albicans 9/26 Sputum: few candida albicans  9/23 MRSA PCR: negative  Thank you for allowing pharmacy to be a part of this patient's care.  MLS 05/23/2017 10:15 AM

## 2017-05-23 NOTE — Progress Notes (Signed)
2010: pt. Self-extubated with bilateral mitts in place, pulling ETT & OGT out.  This RN immediately responded turning TF off, calling RT bedside, calling charge RN who grabbed re-intubation supplies and called Ms. Luci Bank, NP bedside. RT attempted to intubate, pt. Actively vomiting, unable to place airway- pt. O2 sats responding to being bagged, pt. Did not lose pulse. 2014: code blue called to facilitate ED MD to come bedside at intubate. Pt. Difficult to intubate, requiring a number of attempts. 2045: Pt. Intubated and VSS. Will continue to monitor pt. Closely.

## 2017-05-23 NOTE — Progress Notes (Signed)
[employer] [facility] [place] at [facility]                                                                                                                                                                                  Patient Demographics   Layli Capshaw, is a 41 y.o. female, DOB - 02/19/1976, ZOX:[id number]  Admit date - 05/17/2017   Admitting Physician Ramonita Lab, MD  Outpatient Primary MD for the patient is Burns, Shellia Cleverly, MD   LOS - 7  Patient is on full vent support but PEEP increased to 10 secondary to respiratory distress this morning.  Review of Systems:   CONSTITUTIONAL:limited Due to patient's illness  Vitals:   Vitals:   05/23/17 0900 05/23/17 1000 05/23/17 1100 05/23/17 1110  BP: (!) 128/57 126/62 128/67   Pulse: (!) 111 (!) 109 (!) 110   Resp: (!) 30 (!) 30 (!) 31   Temp:      TempSrc:      SpO2: 96% 94% 95% 95%  Weight:      Height:        Wt Readings from Last 3 Encounters:  05/22/17 79.1 kg (174 lb 6.1 oz)  04/04/17 64.4 kg (142 lb)  01/12/17 65.8 kg (145 lb)     Intake/Output Summary (Last 24 hours) at 05/23/17 1156 Last data filed at 05/23/17 1118  Gross per 24 hour  Intake          2286.26 ml  Output             2260 ml  Net            26.26 ml    Physical Exam:   GENERAL: intubated  HEAD, EYES, EARS, NOSE AND THROAT: Bilateral conjunctival erythema lip swelling NECK: Supple. There is no jugular venous distention. No bruits, no lymphadenopathy, no thyromegaly.  HEART: Regular rate and rhythm,. No murmurs, no rubs, no clicks.  LUNGS: decreased breath sounds bilaterally ABDOMEN: Soft, flat, nontender, nondistended. Has good bowel sounds. No hepatosplenomegaly appreciated.  EXTREMITIES: No evidence of any cyanosis, clubbing, or peripheral edema.  +2 pedal and radial pulses bilaterally.  NEUROLOGIC: intubated SKIN:  Psych: intubated LN: No inguinal LN enlargement    Antibiotics   Anti-infectives    Start     Dose/Rate  Route Frequency Ordered Stop   05/22/17 1000  fluconazole (DIFLUCAN) IVPB 400 mg     400 mg 100 mL/hr over 120 Minutes Intravenous Every 24 hours 05/21/17 1113     05/21/17 1500  meropenem (MERREM) 1 g in sodium chloride 0.9 % 100 mL IVPB     1 g 200 mL/hr over 30 Minutes Intravenous Every 8  hours 05/21/17 1438     05/21/17 1200  fluconazole (DIFLUCAN) IVPB 800 mg     800 mg 100 mL/hr over 240 Minutes Intravenous  Once 05/21/17 1113 05/21/17 1528   05/18/17 0900  piperacillin-tazobactam (ZOSYN) IVPB 3.375 g  Status:  Discontinued     3.375 g 12.5 mL/hr over 240 Minutes Intravenous Every 8 hours 05/18/17 0851 05/21/17 1433   05/17/17 0200  vancomycin (VANCOCIN) IVPB 750 mg/150 ml premix  Status:  Discontinued     750 mg 150 mL/hr over 60 Minutes Intravenous Every 8 hours 05-28-17 1857 05/28/17 1902   05/28/17 2100  ceFEPIme (MAXIPIME) 2 g in dextrose 5 % 50 mL IVPB  Status:  Discontinued     2 g 100 mL/hr over 30 Minutes Intravenous Every 8 hours 05-28-17 1740 05/18/17 0851   05/28/17 2100  vancomycin (VANCOCIN) IVPB 750 mg/150 ml premix  Status:  Discontinued     750 mg 150 mL/hr over 60 Minutes Intravenous Every 8 hours 2017-05-28 1902 05/18/17 0851   2017/05/28 2000  vancomycin (VANCOCIN) IVPB 750 mg/150 ml premix  Status:  Discontinued     750 mg 150 mL/hr over 60 Minutes Intravenous  Once May 28, 2017 1856 05-28-2017 1859   05/28/17 1416  piperacillin-tazobactam (ZOSYN) 3-0.375 GM/50ML IVPB    Comments:  Hatch, Shannin   : cabinet override      May 28, 2017 1416 05/17/17 0229   May 28, 2017 1400  vancomycin (VANCOCIN) IVPB 1000 mg/200 mL premix     1,000 mg 200 mL/hr over 60 Minutes Intravenous  Once 05/28/2017 1350 28-May-2017 1643   2017/05/28 1345  vancomycin (VANCOCIN) injection 1 g  Status:  Discontinued     1 g Intravenous  Once May 28, 2017 1345 05-28-2017 1350   May 28, 2017 1345  piperacillin-tazobactam (ZOSYN) IVPB 3.375 g     3.375 g 100 mL/hr over 30 Minutes Intravenous  Once 2017/05/28 1345 05/28/17  1504      Medications   Scheduled Meds: . budesonide (PULMICORT) nebulizer solution  0.25 mg Nebulization Q6H  . chlorhexidine  15 mL Mouth/Throat BID  . famotidine  20 mg Per Tube Daily  . free water  300 mL Per Tube Q6H  . insulin aspart  0-15 Units Subcutaneous Q4H  . ipratropium-albuterol  3 mL Nebulization Q6H  . mouth rinse  15 mL Mouth Rinse 10 times per day  . metoprolol tartrate  25 mg Per Tube BID  . sodium chloride flush  10-40 mL Intracatheter Q12H   Continuous Infusions: . feeding supplement (VITAL AF 1.2 CAL) 1,000 mL (05/23/17 0600)  . fentaNYL infusion INTRAVENOUS 75 mcg/hr (05/23/17 0905)  . fluconazole (DIFLUCAN) IV 400 mg (05/23/17 1116)  . meropenem (MERREM) IV Stopped (05/23/17 0725)   PRN Meds:.acetaminophen, fentaNYL, metoprolol tartrate, midazolam, sodium chloride flush   Data Review:   Micro Results Recent Results (from the past 240 hour(s))  Rapid strep screen     Status: None   Collection Time: 05/14/17  5:40 PM  Result Value Ref Range Status   Streptococcus, Group A Screen (Direct) NEGATIVE NEGATIVE Final    Comment: (NOTE) A Rapid Antigen test may result negative if the antigen level in the sample is below the detection level of this test. The FDA has not cleared this test as a stand-alone test therefore the rapid antigen negative result has reflexed to a Group A Strep culture.   Culture, group A strep     Status: None   Collection Time: 05/14/17  5:40 PM  Result Value Ref  Range Status   Specimen Description THROAT  Final   Special Requests NONE Reflexed from [id number]  Final   Culture   Final    NO GROUP A STREP (S.PYOGENES) ISOLATED Performed at [facility], [address], [place], [place] [address]    Report Status 05/17/2017 FINAL  Final  Culture, blood (routine x 2)     Status: Abnormal   Collection Time: 05/06/2017  1:58 PM  Result Value Ref Range Status   Specimen Description BLOOD LT ARM  Final   Special Requests   Final     BOTTLES DRAWN AEROBIC AND ANAEROBIC Blood Culture results may not be optimal due to an inadequate volume of blood received in culture bottles   Culture  Setup Time   Final    GRAM POSITIVE COCCI ANAEROBIC BOTTLE ONLY CRITICAL RESULT CALLED TO, READ BACK BY AND VERIFIED WITH: LISA KLUTTZ AT 1025 05/17/17 SDR    Culture (A)  Final    STAPHYLOCOCCUS SPECIES (COAGULASE NEGATIVE) THE SIGNIFICANCE OF ISOLATING THIS ORGANISM FROM A SINGLE SET OF BLOOD CULTURES WHEN MULTIPLE SETS ARE DRAWN IS UNCERTAIN. PLEASE NOTIFY THE MICROBIOLOGY DEPARTMENT WITHIN ONE WEEK IF SPECIATION AND SENSITIVITIES ARE REQUIRED. Performed at [facility], [address], [place], [place] [address]    Report Status 05/19/2017 FINAL  Final  Blood Culture ID Panel (Reflexed)     Status: Abnormal   Collection Time: 05/14/2017  1:58 PM  Result Value Ref Range Status   Enterococcus species NOT DETECTED NOT DETECTED Final   Listeria monocytogenes NOT DETECTED NOT DETECTED Final   Staphylococcus species DETECTED (A) NOT DETECTED Final    Comment: Methicillin (oxacillin) resistant coagulase negative staphylococcus. Possible blood culture contaminant (unless isolated from more than one blood culture draw or clinical case suggests pathogenicity). No antibiotic treatment is indicated for blood  culture contaminants. CRITICAL RESULT CALLED TO, READ BACK BY AND VERIFIED WITH: LISA KLUTZ ON 05/17/17 AT 1025 SDR    Staphylococcus aureus NOT DETECTED NOT DETECTED Final   Methicillin resistance DETECTED (A) NOT DETECTED Final    Comment: CRITICAL RESULT CALLED TO, READ BACK BY AND VERIFIED WITH: LISA KLUTZ ON 05/17/17 AT 1025 SDR    Streptococcus species NOT DETECTED NOT DETECTED Final   Streptococcus agalactiae NOT DETECTED NOT DETECTED Final   Streptococcus pneumoniae NOT DETECTED NOT DETECTED Final   Streptococcus pyogenes NOT DETECTED NOT DETECTED Final   Acinetobacter baumannii NOT DETECTED NOT DETECTED Final    Enterobacteriaceae species NOT DETECTED NOT DETECTED Final   Enterobacter cloacae complex NOT DETECTED NOT DETECTED Final   Escherichia coli NOT DETECTED NOT DETECTED Final   Klebsiella oxytoca NOT DETECTED NOT DETECTED Final   Klebsiella pneumoniae NOT DETECTED NOT DETECTED Final   Proteus species NOT DETECTED NOT DETECTED Final   Serratia marcescens NOT DETECTED NOT DETECTED Final   Haemophilus influenzae NOT DETECTED NOT DETECTED Final   Neisseria meningitidis NOT DETECTED NOT DETECTED Final   Pseudomonas aeruginosa NOT DETECTED NOT DETECTED Final   Candida albicans NOT DETECTED NOT DETECTED Final   Candida glabrata NOT DETECTED NOT DETECTED Final   Candida krusei NOT DETECTED NOT DETECTED Final   Candida parapsilosis NOT DETECTED NOT DETECTED Final   Candida tropicalis NOT DETECTED NOT DETECTED Final  Culture, blood (routine x 2)     Status: None   Collection Time: 05/15/2017  3:26 PM  Result Value Ref Range Status   Specimen Description BLOOD RT ARM  Final   Special Requests  Final    BOTTLES DRAWN AEROBIC AND ANAEROBIC Blood Culture results may not be optimal due to an inadequate volume of blood received in culture bottles   Culture NO GROWTH 5 DAYS  Final   Report Status 05/21/2017 FINAL  Final  Urine culture     Status: None   Collection Time: 2017/06/12  4:57 PM  Result Value Ref Range Status   Specimen Description URINE, CLEAN CATCH  Final   Special Requests NONE  Final   Culture   Final    NO GROWTH Performed at [facility], [address], [place], [place] [address]    Report Status 05/18/2017 FINAL  Final  Culture, blood (routine x 2) Call MD if unable to obtain prior to antibiotics being given     Status: None   Collection Time: Jun 12, 2017  5:54 PM  Result Value Ref Range Status   Specimen Description BLOOD LT WRIST  Final   Special Requests   Final    BOTTLES DRAWN AEROBIC AND ANAEROBIC Blood Culture adequate volume   Culture NO GROWTH 5 DAYS  Final   Report  Status 05/21/2017 FINAL  Final  MRSA PCR Screening     Status: None   Collection Time: 06/12/2017  6:35 PM  Result Value Ref Range Status   MRSA by PCR NEGATIVE NEGATIVE Final    Comment:        The GeneXpert MRSA Assay (FDA approved for NASAL specimens only), is one component of a comprehensive MRSA colonization surveillance program. It is not intended to diagnose MRSA infection nor to guide or monitor treatment for MRSA infections.   Culture, blood (routine x 2) Call MD if unable to obtain prior to antibiotics being given     Status: None   Collection Time: 06/12/17  7:10 PM  Result Value Ref Range Status   Specimen Description BLOOD RIGHT WRIST  Final   Special Requests   Final    BOTTLES DRAWN AEROBIC AND ANAEROBIC Blood Culture results may not be optimal due to an excessive volume of blood received in culture bottles   Culture NO GROWTH 5 DAYS  Final   Report Status 05/21/2017 FINAL  Final  Respiratory Panel by PCR     Status: None   Collection Time: 05/18/17  2:21 PM  Result Value Ref Range Status   Adenovirus NOT DETECTED NOT DETECTED Final   Coronavirus 229E NOT DETECTED NOT DETECTED Final   Coronavirus HKU1 NOT DETECTED NOT DETECTED Final   Coronavirus NL63 NOT DETECTED NOT DETECTED Final   Coronavirus OC43 NOT DETECTED NOT DETECTED Final   Metapneumovirus NOT DETECTED NOT DETECTED Final   Rhinovirus / Enterovirus NOT DETECTED NOT DETECTED Final   Influenza A NOT DETECTED NOT DETECTED Final   Influenza B NOT DETECTED NOT DETECTED Final   Parainfluenza Virus 1 NOT DETECTED NOT DETECTED Final   Parainfluenza Virus 2 NOT DETECTED NOT DETECTED Final   Parainfluenza Virus 3 NOT DETECTED NOT DETECTED Final   Parainfluenza Virus 4 NOT DETECTED NOT DETECTED Final   Respiratory Syncytial Virus NOT DETECTED NOT DETECTED Final   Bordetella pertussis NOT DETECTED NOT DETECTED Final   Chlamydophila pneumoniae NOT DETECTED NOT DETECTED Final   Mycoplasma pneumoniae NOT DETECTED  NOT DETECTED Final    Comment: Performed at [facility], [address] [place], [place] [address]  Culture, respiratory (NON-Expectorated)     Status: None   Collection Time: 05/18/17  2:21 PM  Result Value Ref Range Status  Specimen Description SPUTUM  Final   Special Requests NONE  Final   Gram Stain   Final    ABUNDANT WBC PRESENT, PREDOMINANTLY MONONUCLEAR ABUNDANT YEAST Performed at [facility], [address] [place], [place] [address]    Culture MODERATE CANDIDA ALBICANS  Final   Report Status 05/21/2017 FINAL  Final  Culture, respiratory (NON-Expectorated)     Status: None   Collection Time: 05/19/17  1:40 AM  Result Value Ref Range Status   Specimen Description SPUTUM  Final   Special Requests NONE  Final   Gram Stain   Final    ABUNDANT WBC PRESENT, PREDOMINANTLY PMN MODERATE YEAST WITH PSEUDOHYPHAE Performed at [facility], [address], [place], [place] [address]    Culture FEW CANDIDA ALBICANS  Final   Report Status 05/21/2017 FINAL  Final    Radiology Reports Dg Chest 2 View  Result Date: May 31, 2017 CLINICAL DATA:  Cough and lethargy EXAM: CHEST  2 VIEW COMPARISON:  01/12/2017 FINDINGS: Cardiac shadow is within normal limits. The lungs are well aerated bilaterally. Patchy infiltrative changes are seen in the left mid and lower lung as well as the right lung base. No sizable effusion is seen. No bony abnormality is noted. Ventricular shunt is noted on the right. IMPRESSION: Bilateral infiltrates as described. Electronically Signed   By: Alcide Clever M.D.   On: May 31, 2017 12:37   Dg Abd 1 View  Result Date: 05/21/2017 CLINICAL DATA:  New onset of abdominal distention. EXAM: ABDOMEN - 1 VIEW COMPARISON:  05/20/2017. FINDINGS: The bowel gas pattern is normal. No radio-opaque calculi or other significant radiographic abnormality are seen. Bibasilar lung opacities, incompletely evaluated. RIGHT-sided ventriculoperitoneal shunt catheter is  unchanged. Orogastric tube tip lies within the stomach. Cholecystectomy clips. IMPRESSION: Stable exam. Electronically Signed   By: Elsie Stain M.D.   On: 05/21/2017 16:41   Ct Angio Chest Pe W Or Wo Contrast  Result Date: 05/18/2017 CLINICAL DATA:  Shortness of breath. EXAM: CT ANGIOGRAPHY CHEST WITH CONTRAST TECHNIQUE: Multidetector CT imaging of the chest was performed using the standard protocol during bolus administration of intravenous contrast. Multiplanar CT image reconstructions and MIPs were obtained to evaluate the vascular anatomy. CONTRAST:  75 mL of Isovue 370 intravenously. COMPARISON:  Radiographs of May 17, 2017. FINDINGS: Cardiovascular: Satisfactory opacification of the pulmonary arteries to the segmental level. No evidence of pulmonary embolism. Normal heart size. No pericardial effusion. Mediastinum/Nodes: No enlarged mediastinal, hilar, or axillary lymph nodes. Thyroid gland, trachea, and esophagus demonstrate no significant findings. Lungs/Pleura: No pneumothorax is noted. Large left upper and lower lobe airspace opacities are noted most consistent with pneumonia. Smaller right upper and lower lobe opacities are also noted most consistent with pneumonia. Mild bilateral pleural effusions are noted. Upper Abdomen: No acute abnormality. Musculoskeletal: No chest wall abnormality. No acute or significant osseous findings. Review of the MIP images confirms the above findings. IMPRESSION: No definite evidence of pulmonary embolus. Bilateral upper and lower lobe airspace opacities are noted most consistent with pneumonia. Mild bilateral pleural effusions are noted. Electronically Signed   By: Lupita Raider, M.D.   On: 05/18/2017 12:10   US Renal  Result Date: 05/22/2017 CLINICAL DATA:  41 year old female with acute kidney injury. EXAM: RENAL / URINARY TRACT ULTRASOUND COMPLETE COMPARISON:  09/27/2012 and prior CTs FINDINGS: Right Kidney: Length: 11.5 cm with mild cortical thinning.  Echogenicity within normal limits. No mass or hydronephrosis visualized. Left Kidney: Length: 12.7 cm with mild cortical thinning.  Echogenicity within normal limits. No mass or hydronephrosis visualized. Bladder: A Foley catheter within the bladder is noted. IMPRESSION: Mild bilateral renal cortical thinning without other significant renal abnormality. Electronically Signed   By: Harmon Pier M.D.   On: 05/22/2017 13:03   Dg Chest Port 1 View  Result Date: 05/23/2017 CLINICAL DATA:  Respiratory failure EXAM: PORTABLE CHEST 1 VIEW COMPARISON:  05/22/2017 FINDINGS: Patient has a right ventriculoperitoneal shunt. Left IJ central line tip overlies the level of superior vena cava. Endotracheal tube is in place, tip approximately 1.9 cm above the carina. A nasogastric tube is in place, off the image and beyond the level of the mid stomach. The heart size is probably normal. There are patchy airspace filling opacities bilaterally, similar in appearance to prior study. IMPRESSION: Persistent significant bilateral airspace filling opacities. Electronically Signed   By: Norva Pavlov M.D.   On: 05/23/2017 08:33   Dg Chest Port 1 View  Result Date: 05/22/2017 CLINICAL DATA:  Respiratory failure EXAM: PORTABLE CHEST 1 VIEW COMPARISON:  05/20/2017 FINDINGS: Cardiomediastinal silhouette unchanged. An endotracheal tube with tip 1 cm above the carina, NG tube entering the stomach with tip off the field of view, and left IJ central venous catheter with tip overlying the superior cavoatrial junction again noted. Diffuse bilateral airspace opacities, left-greater-than-right, have slightly improved. No pneumothorax. IMPRESSION: Slightly decreased diffuse bilateral airspace opacities, otherwise unchanged appearance the chest. Electronically Signed   By: Harmon Pier M.D.   On: 05/22/2017 07:07   Dg Chest Port 1 View  Result Date: 05/20/2017 CLINICAL DATA:  Respiratory failure. EXAM: PORTABLE CHEST 1 VIEW COMPARISON:   05/19/2017. FINDINGS: Endotracheal tube, NG tube, left IJ line stable position. Heart size normal. Diffuse bilateral pulmonary infiltrates/edema again noted. No interim change. Small left pleural effusion cannot be excluded. No pneumothorax. IMPRESSION: 1. Lines and tubes in stable position. 2. Diffuse bilateral pulmonary infiltrates/edema again noted. No interim change. Small left pleural effusion cannot be excluded. Electronically Signed   By: Maisie Fus  Register   On: 05/20/2017 06:34   Dg Chest Port 1 View  Result Date: 05/19/2017 CLINICAL DATA:  The EXAM: PORTABLE CHEST 1 VIEW COMPARISON:  Prior radiograph from 05/18/2017. FINDINGS: Endotracheal tube in place with tip positioned 19 mm above the carina. Enteric tube courses in the abdomen. Left IJ approach centra venous catheter in place with tip in the proximal right atrium, stable. Cardiac and mediastinal silhouettes are unchanged. Patchy multifocal opacities with consolidation and air bronchograms again seen involving the bilateral lungs, left greater than right. There is increased opacity at the left lung base as compared to previous, with some improved aeration at the left upper lobe. Right lung relatively similar in appearance. No pleural effusion. No appreciable pneumothorax. Osseous structures unchanged. IMPRESSION: 1. Support apparatus in satisfactory position as above. 2. Persistent patchy multifocal airspace opacities, left greater than right, concerning for multilobar pneumonia. As compared to previous, there is worsened opacity at the left lung base, with slightly improved aeration at the left upper lung. Electronically Signed   By: Rise Mu M.D.   On: 05/19/2017 15:54   Dg Chest Port 1 View  Result Date: 05/18/2017 CLINICAL DATA:  Central line placement EXAM: PORTABLE CHEST 1 VIEW COMPARISON:  05/17/2017 FINDINGS: Left IJ approach central line catheter tip is seen at the cavoatrial junction. No pneumothorax. The tip of an  endotracheal tube is 3.1 cm above the carina. Gastric tube extends below the left hemidiaphragm into the expected location of the stomach. VP  shunt tubing projects over the right hemithorax and extends into the abdomen. The tip is excluded on this study. Patchy pneumonic consolidations with air bronchograms are seen in both upper lobes, lingula and left lower lobe. Heart size is normal. The aorta is obscured by the adjacent pneumonia. IMPRESSION: 1. Satisfactory support line and tube positions. 2. Left IJ central line catheter seen with tip at the cavoatrial junction. No pneumothorax. 3. Patchy multilobar pneumonia with air bronchograms. Electronically Signed   By: Tollie Eth M.D.   On: 05/18/2017 19:47   Dg Chest Port 1 View  Result Date: 05/17/2017 CLINICAL DATA:  Cough EXAM: PORTABLE CHEST 1 VIEW COMPARISON:  Chest radiograph 17-May-2017 at 12:07 p.m. FINDINGS: Marked worsening of consolidative opacity in the left mid lung compared to the earlier study. Aeration at the right lung base is improved. No pneumothorax or pleural effusion. IMPRESSION: Marked worsening of left mid lung consolidation, likely infection or aspiration. Electronically Signed   By: Deatra Robinson M.D.   On: 05/17/2017 02:18   Dg Abd Portable 1v  Result Date: 05/20/2017 CLINICAL DATA:  Orogastric tube placement. EXAM: PORTABLE ABDOMEN - 1 VIEW COMPARISON:  Radiographs of May 18, 2017. FINDINGS: The bowel gas pattern is normal. No radio-opaque calculi or other significant radiographic abnormality are seen. Status post cholecystectomy. Distal tip of orogastric tube is seen in proximal stomach. 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 IMPRESSION: No evidence of bowel obstruction or ileus. Distal tip of orogastric tube is seen in proximal stomach. Electronically Signed   By: Lupita Raider, M.D.   On: 05/20/2017 14:39   Dg Abd Portable 1v  Result Date: 05/18/2017 CLINICAL DATA:  OG tube placement EXAM:  PORTABLE ABDOMEN - 1 VIEW COMPARISON:  None. FINDINGS: The tip and side port of a gastric tube are seen below the left hemidiaphragm in the expected location of the stomach. The tip however appears to coiled back toward the GE junction band is approximately 2 cm from the GE junction. Scattered gas containing small and large bowel loops without obstructive pattern. No free air. Contrast is seen within both renal collecting systems likely from recent administration of IV contrast. VP shunt tip projects over the left lower quadrant. Cholecystectomy clips are seen in the right upper quadrant. IMPRESSION: 1. Gastric tube tip and side-port are seen in the expected location of stomach however the tip projects back toward the GE junction and is seen approximately 2 cm from the expected location of the gastroesophageal juncture. 2. VP shunt tubing tip is seen in the left lower quadrant of the abdomen. Electronically Signed   By: Tollie Eth M.D.   On: 05/18/2017 19:44     CBC  Recent Labs Lab 2017-05-17 1311 05/18/17 1006 05/19/17 0508 05/20/17 0418 05/21/17 0414 05/22/17 0414 05/23/17 0527  WBC 14.4* 17.8* 21.0* 22.1* 20.9* 26.0* 21.7*  HGB 13.5 12.7 11.8* 11.8* 11.6* 10.8* 10.6*  HCT 41.2 38.9 36.3 36.6 35.3 32.8* 31.9*  PLT 98* 79* 80* 58* 46* 57* 71*  MCV 88.1 87.8 88.0 87.9 89.5 89.8 88.9  MCH 28.8 28.7 28.5 28.4 29.5 29.7 29.7  MCHC 32.7 32.7 32.4 32.3 32.9 33.1 33.4  RDW 15.3* 15.5* 15.7* 16.4* 16.4* 17.3* 17.2*  LYMPHSABS 1.1 1.0  --  0.4*  --  1.1  --   MONOABS 0.4 0.8  --  1.1*  --  2.0*  --   EOSABS 0.1 0.0  --  0.0  --  0.0  --   BASOSABS 0.0  0.1  --  0.0  --  0.0  --     Chemistries   Recent Labs Lab 04/30/2017 1311  05/19/17 0508  05/21/17 0414 05/21/17 2206 05/22/17 0414 05/22/17 1500 05/23/17 0527  NA 138  < > 148*  < > 143 146* 144 145 146*  K 4.0  < > 3.0*  < > 3.2* 4.7 4.9 4.4 4.3  CL 102  < > 120*  < > 114* 117* 116* 117* 116*  CO2 25  < > 22  < > 22 23 22 24 23    GLUCOSE 97  < > 183*  < > 199* 154* 143* 131* 131*  BUN 16  < > 24*  < > 56* 70* 71* 77* 74*  CREATININE 0.72  < > 0.84  < > 1.55* 1.88* 2.07* 2.14* 2.12*  CALCIUM 8.6*  < > 7.9*  < > 7.6* 7.8* 7.8* 7.9* 7.8*  MG  --   --  2.2  --  2.2 2.2  --   --   --   AST 58*  --   --   --   --   --  74*  --   --   ALT 61*  --   --   --   --   --  81*  --   --   ALKPHOS 124  --   --   --   --   --  120  --   --   BILITOT 2.0*  --   --   --   --   --  0.5  --   --   < > = values in this interval not displayed. ------------------------------------------------------------------------------------------------------------------ estimated creatinine clearance is 36.7 mL/min (A) (by C-G formula based on SCr of 2.12 mg/dL (H)). ------------------------------------------------------------------------------------------------------------------ No results for input(s): HGBA1C in the last 72 hours. ------------------------------------------------------------------------------------------------------------------ No results for input(s): CHOL, HDL, LDLCALC, TRIG, CHOLHDL, LDLDIRECT in the last 72 hours. ------------------------------------------------------------------------------------------------------------------ No results for input(s): TSH, T4TOTAL, T3FREE, THYROIDAB in the last 72 hours.  Invalid input(s): FREET3 ------------------------------------------------------------------------------------------------------------------ No results for input(s): VITAMINB12, FOLATE, FERRITIN, TIBC, IRON, RETICCTPCT in the last 72 hours.  Coagulation profile No results for input(s): INR, PROTIME in the last 168 hours.  No results for input(s): DDIMER in the last 72 hours.  Cardiac Enzymes  Recent Labs Lab 05/17/17 0042 05/19/17 1355 05/21/17 2206  TROPONINI 0.03* 0.07* 0.03*   ------------------------------------------------------------------------------------------------------------------ Invalid input(s):  POCBNP    Assessment & Plan  Lindsey Allison  is a 41 y.o. female with a known history of myoclonic dystrophy, anxiety, GERD,hydrocephalus,poor historian, is brought into the ED by her care giver as patient is not feeling well Patient was seen at urgent care on Friday, throat cultures were obtained and patient was given a prescription for azithromycin for possible strep throat,. Both conjunctiva are red and patient is brought into the  Emergency department.Chest x-ray has revealed bilateral infiltrates  #Acute respiratory failure Secondary to severe pneumonia: Seen by Dr. Sampson Goon, antibiotics are changed to  IV meropenem and fluconazole.continue full vent support.  myotonic dystrophy prognosis is not very good  # sepsis from aspiration pneumonia  Continue therapy with IV antibiotics Positive blood cultures likely contamination Sputum showing Candida. CBC of 26 today. 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 Monitor closely. Diarrhea: Stop stool softeners per 24 hours, check stool for C. difficile.     Code Status Orders        Start     Ordered   04/25/2017 1740  Full code  Continuous     05/21/2017 1740    Code Status History    Date Active Date Inactive Code Status Order ID Comments User Context   This patient has a current code status but no historical code status.           Consults   none   DVT Prophylaxis  Lovenox     Lab Results  Component Value Date   PLT 71 (L) 05/23/2017     Time Spent in minutes   35 minutes spent  [facility] M.D on 05/23/2017 at 11:56 AM  Between 7am to 6pm - Pager - [phone]  After 6pm go to [email or web address] - password [email or web address] [facility]  [facility]   Office  [phone]

## 2017-05-23 NOTE — Progress Notes (Signed)
Increased work of breathing with respiratory rate 41.  Fentanyl bolus given.  Will continue to assess.

## 2017-05-23 NOTE — Progress Notes (Signed)
Alerted by central tele that pt. Has new ST elevation, since 2100 strip. Ms. Luci Bank, NP alerted. 12 lead ordered, troponin ordered. Will continue to monitor pt. Closely.

## 2017-05-23 NOTE — ED Provider Notes (Signed)
[facility] Department of Emergency Medicine   Code Blue CONSULT NOTE  Chief Complaint: Cardiac arrest/unresponsive   Level V Caveat: Unresponsive  History of present illness: I was contacted by the hospital for a CODE BLUE upstairs and presented to the patient's bedside.  I was informed by staff that patient had self extubated and attempt by respiratory to reintubate prior to my arrival was unsuccessful.  On my arrival pt was getting BVM ventilation and O2 sat was in the 80s.   ROS: Unable to obtain, Level V caveat  Scheduled Meds: . budesonide (PULMICORT) nebulizer solution  0.25 mg Nebulization Q6H  . chlorhexidine  15 mL Mouth/Throat BID  . famotidine  20 mg Per Tube Daily  . free water  300 mL Per Tube Q6H  . hydrocortisone cream   Topical BID  . insulin aspart  0-15 Units Subcutaneous Q4H  . ipratropium-albuterol  3 mL Nebulization Q6H  . mouth rinse  15 mL Mouth Rinse 10 times per day  . metoprolol tartrate  25 mg Per Tube BID  . sodium chloride flush  10-40 mL Intracatheter Q12H   Continuous Infusions: . feeding supplement (VITAL AF 1.2 CAL) 1,000 mL (05/23/17 0600)  . fluconazole (DIFLUCAN) IV Stopped (05/23/17 1316)  . meropenem (MERREM) IV Stopped (05/23/17 1537)  . metronidazole    . propofol (DIPRIVAN) infusion     PRN Meds:.acetaminophen, metoprolol tartrate, midazolam, midazolam, sodium chloride flush Past Medical History:  Diagnosis Date  . Acid reflux   . Anxiety   . Constipation   . Hydrocephalus   . Muscular dystrophy [facility])    Past Surgical History:  Procedure Laterality Date  . shunt in head     Social History   Social History  . Marital status: Single    Spouse name: N/A  . Number of children: N/A  . Years of education: N/A   Occupational History  . Not on file.   Social History Main Topics  . Smoking status: Never Smoker  . Smokeless tobacco: Never Used  . Alcohol use No  . Drug use: No  . Sexual activity: Not  on file   Other Topics Concern  . Not on file   Social History Narrative  . No narrative on file   Allergies  Allergen Reactions  . Zosyn [Piperacillin Sod-Tazobactam So] Rash  . Belladonna Alkaloids Other (See Comments)    Unsure of reaction Unsure of reaction    Last set of Vital Signs (not current) Vitals:   05/23/17 1700 05/23/17 1800  BP: 100/60 116/66  Pulse: 86 94  Resp: (!) 35 (!) 33  Temp:    SpO2: 93% 95%      Physical Exam  Gen: unresponsive Cardiovascular: Good peripheral circulation Resp: Labored breathing, mildly cyanotic. Breath sounds equal bilaterally with bagging  Abd: nondistended  Neuro: Unresponsive HEENT: secretions in posterior pharynx, gag reflex absent  Neck: No crepitus  Musculoskeletal: No deformity  Skin: warm  Procedures  INTUBATION Performed by: Dionne Bucy Required items: required blood products, implants, devices, and special equipment available Patient identity confirmed: provided demographic data and hospital-assigned identification number Time out: Immediately prior to procedure a "time out" was called to verify the correct patient, procedure, equipment, support staff and site/side marked as required. Indications: Respiratory failure, removal of tube Intubation method: Glidescope Preoxygenation: BVM Sedatives: Etomidate  Paralytic: Succinylcholine  Tube Size: 7.0 cuffed Post-procedure assessment: chest rise and ETCO2 monitor Breath sounds: equal and absent over the epigastrium Tube secured by  Respiratory Therapy  On first attempt, I was unable to obtain adequate visualization of cords due to secretions and fogging, despite suction.  BVM ventilation was restarted and after increase in O2 sat, I made a second attempt.  On second attempt cords were visualized and ETT was passed without difficulty.  Patient tolerated the procedure well with no immediate complications.  CRITICAL CARE Performed by: Dionne Bucy Total critical care time: 20 mins Critical care time was exclusive of separately billable procedures and treating other patients. Critical care was necessary to treat or prevent imminent or life-threatening deterioration. Critical care was time spent personally by me on the following activities: development of treatment plan with patient and/or surrogate as well as nursing, discussions with consultants, evaluation of patient's response to treatment, examination of patient, obtaining history from patient or surrogate, ordering and performing treatments and interventions, ordering and review of laboratory studies, ordering and review of radiographic studies, pulse oximetry and re-evaluation of patient's condition.  Medical Decision making  Patient intubated for sepsis and respiratory failure and had self extubated; required reintubation to secure airway.    Assessment and Plan  Patient successfully intubated on second attempt, with return of O2 sat to 90s, adequate chest rise and capnograph color change.  No complications noted.     Dionne Bucy, MD 05/23/17 2121

## 2017-05-24 ENCOUNTER — Inpatient Hospital Stay: Payer: Medicaid Other

## 2017-05-24 LAB — BASIC METABOLIC PANEL
Anion gap: 8 (ref 5–15)
BUN: 81 mg/dL — ABNORMAL HIGH (ref 6–20)
CALCIUM: 7.5 mg/dL — AB (ref 8.9–10.3)
CO2: 21 mmol/L — AB (ref 22–32)
CREATININE: 2.75 mg/dL — AB (ref 0.44–1.00)
Chloride: 115 mmol/L — ABNORMAL HIGH (ref 101–111)
GFR, EST AFRICAN AMERICAN: 23 mL/min — AB (ref >60)
GFR, EST NON AFRICAN AMERICAN: 20 mL/min — AB (ref >60)
Glucose, Bld: 205 mg/dL — ABNORMAL HIGH (ref 65–99)
Potassium: 4.3 mmol/L (ref 3.5–5.1)
Sodium: 144 mmol/L (ref 135–145)

## 2017-05-24 LAB — CBC
HCT: 35.9 % (ref 35.0–47.0)
Hemoglobin: 11.4 g/dL — ABNORMAL LOW (ref 12.0–16.0)
MCH: 29.9 pg (ref 26.0–34.0)
MCHC: 31.9 g/dL — AB (ref 32.0–36.0)
MCV: 93.7 fL (ref 80.0–100.0)
PLATELETS: 89 10*3/uL — AB (ref 150–440)
RBC: 3.83 MIL/uL (ref 3.80–5.20)
RDW: 18 % — ABNORMAL HIGH (ref 11.5–14.5)
WBC: 14.9 10*3/uL — ABNORMAL HIGH (ref 3.6–11.0)

## 2017-05-24 LAB — TROPONIN I: TROPONIN I: 0.04 ng/mL — AB (ref <0.03)

## 2017-05-24 LAB — GLUCOSE, CAPILLARY
GLUCOSE-CAPILLARY: 82 mg/dL (ref 65–99)
Glucose-Capillary: 138 mg/dL — ABNORMAL HIGH (ref 65–99)
Glucose-Capillary: 104 mg/dL — ABNORMAL HIGH (ref 65–99)

## 2017-05-24 LAB — PROCALCITONIN: PROCALCITONIN: 5.51 ng/mL

## 2017-05-24 LAB — VANCOMYCIN, RANDOM: VANCOMYCIN RM: 5

## 2017-05-24 MED ORDER — NOREPINEPHRINE BITARTRATE 1 MG/ML IV SOLN
0.0000 ug/min | INTRAVENOUS | Status: DC
  Filled 2017-05-24 (×2): qty 16

## 2017-05-24 MED ORDER — VANCOMYCIN HCL IN DEXTROSE 1-5 GM/200ML-% IV SOLN
1000.0000 mg | Freq: Once | INTRAVENOUS | Status: AC
  Administered 2017-05-24: 1000 mg via INTRAVENOUS
  Filled 2017-05-24: qty 200

## 2017-05-24 MED ORDER — MIDAZOLAM HCL 2 MG/2ML IJ SOLN: 2.0000 mg | INTRAMUSCULAR | Status: DC | PRN

## 2017-05-24 MED ORDER — SODIUM CHLORIDE 0.9 % IV BOLUS (SEPSIS)
1000.0000 mL | Freq: Once | INTRAVENOUS | Status: AC
  Administered 2017-05-24: 1000 mL via INTRAVENOUS

## 2017-05-24 MED ORDER — DEXTROSE 50 % IV SOLN
1.0000 | Freq: Once | INTRAVENOUS | Status: AC
  Administered 2017-05-24: 50 mL via INTRAVENOUS
  Filled 2017-05-24: qty 50

## 2017-05-24 MED ORDER — VANCOMYCIN HCL IN DEXTROSE 1-5 GM/200ML-% IV SOLN: 1000.0000 mg | Freq: Once | INTRAVENOUS | Status: DC

## 2017-05-24 MED ORDER — SODIUM CHLORIDE 0.9 % IV SOLN
10.0000 mg/h | INTRAVENOUS | Status: DC
  Administered 2017-05-24: 10 mg/h via INTRAVENOUS
  Filled 2017-05-24: qty 10

## 2017-05-24 MED ORDER — VANCOMYCIN HCL IN DEXTROSE 1-5 GM/200ML-% IV SOLN
1000.0000 mg | INTRAVENOUS | Status: DC
  Filled 2017-05-24 (×2): qty 200

## 2017-05-24 MED ORDER — VASOPRESSIN 20 UNIT/ML IV SOLN
0.0300 [IU]/min | INTRAVENOUS | Status: DC
  Filled 2017-05-24: qty 2

## 2017-05-24 MED ORDER — MORPHINE BOLUS VIA INFUSION
5.0000 mg | INTRAVENOUS | Status: DC | PRN
  Filled 2017-05-24: qty 20

## 2017-05-24 DEATH — deceased

## 2017-05-26 ENCOUNTER — Telehealth: Payer: Self-pay | Admitting: Internal Medicine

## 2017-05-26 LAB — CULTURE, RESPIRATORY: CULTURE: NORMAL

## 2017-05-26 LAB — HERPES SIMPLEX VIRUS(HSV) DNA BY PCR

## 2017-05-26 LAB — CULTURE, RESPIRATORY W GRAM STAIN

## 2017-05-26 NOTE — Telephone Encounter (Signed)
placed in DK's folder to be signed.

## 2017-05-26 NOTE — Telephone Encounter (Signed)
[employer] brought by Death Certificate to be signed Placed in Nurse, mental health

## 2017-05-27 NOTE — Telephone Encounter (Signed)
Copy of death cert faxed to [phone] per funeral home request. Death placed up front for pick up. Nothing further needed.

## 2017-05-29 LAB — CULTURE, BLOOD (ROUTINE X 2)
Culture: NO GROWTH
Culture: NO GROWTH
SPECIAL REQUESTS: ADEQUATE
SPECIAL REQUESTS: ADEQUATE

## 2017-06-03 IMAGING — CR DG CHEST 2V
1 series · 2 of 2 positions shown · non-contrast
Comparison: 01/24/2016 chest radiograph

CLINICAL DATA: 40 y/o  F; mid chest pain.

EXAM:
CHEST  2 VIEW

[Series 1: dg chest 2 view · 0.14mm/px · 2 of 2 slices shown]
[im 1/2]
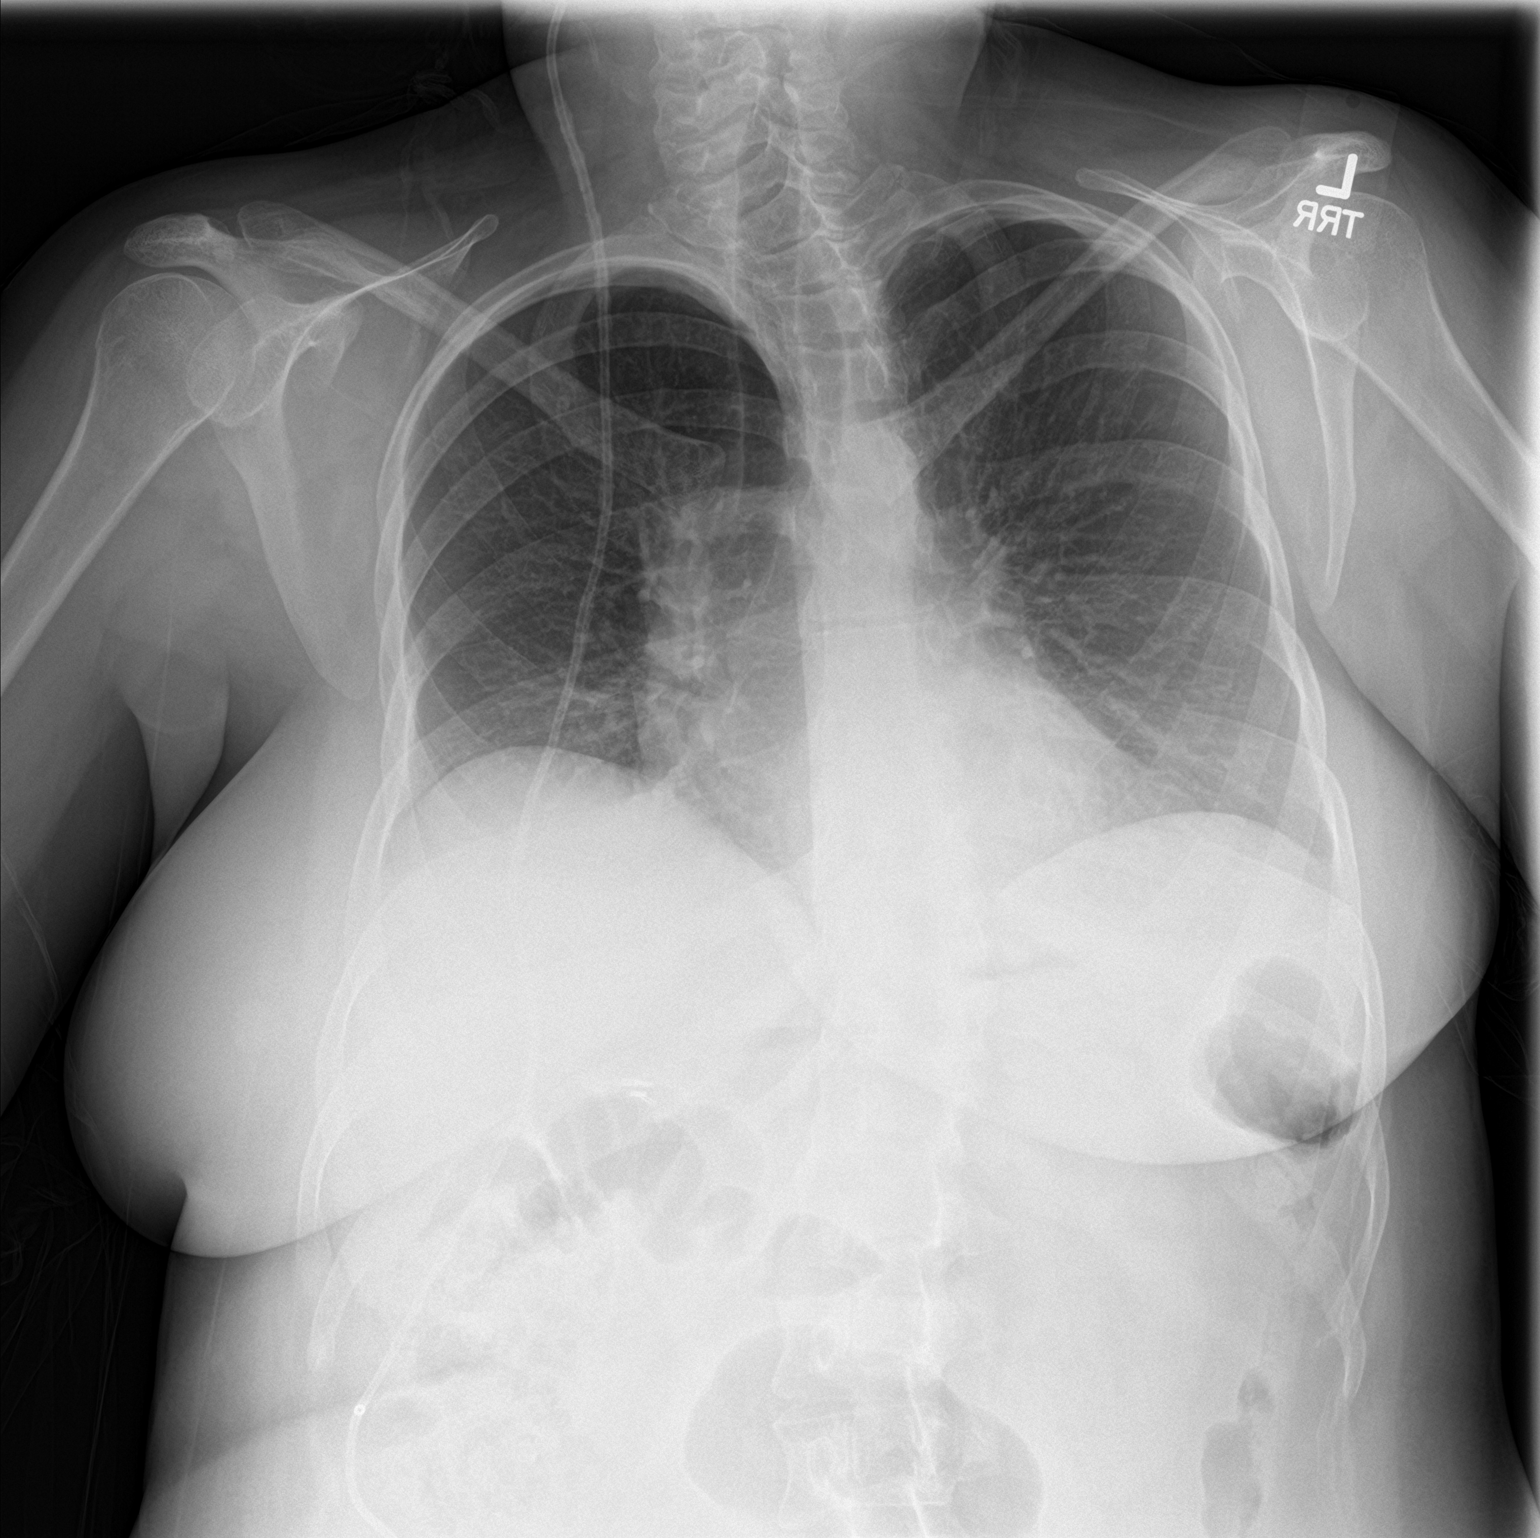
[im 2/2]
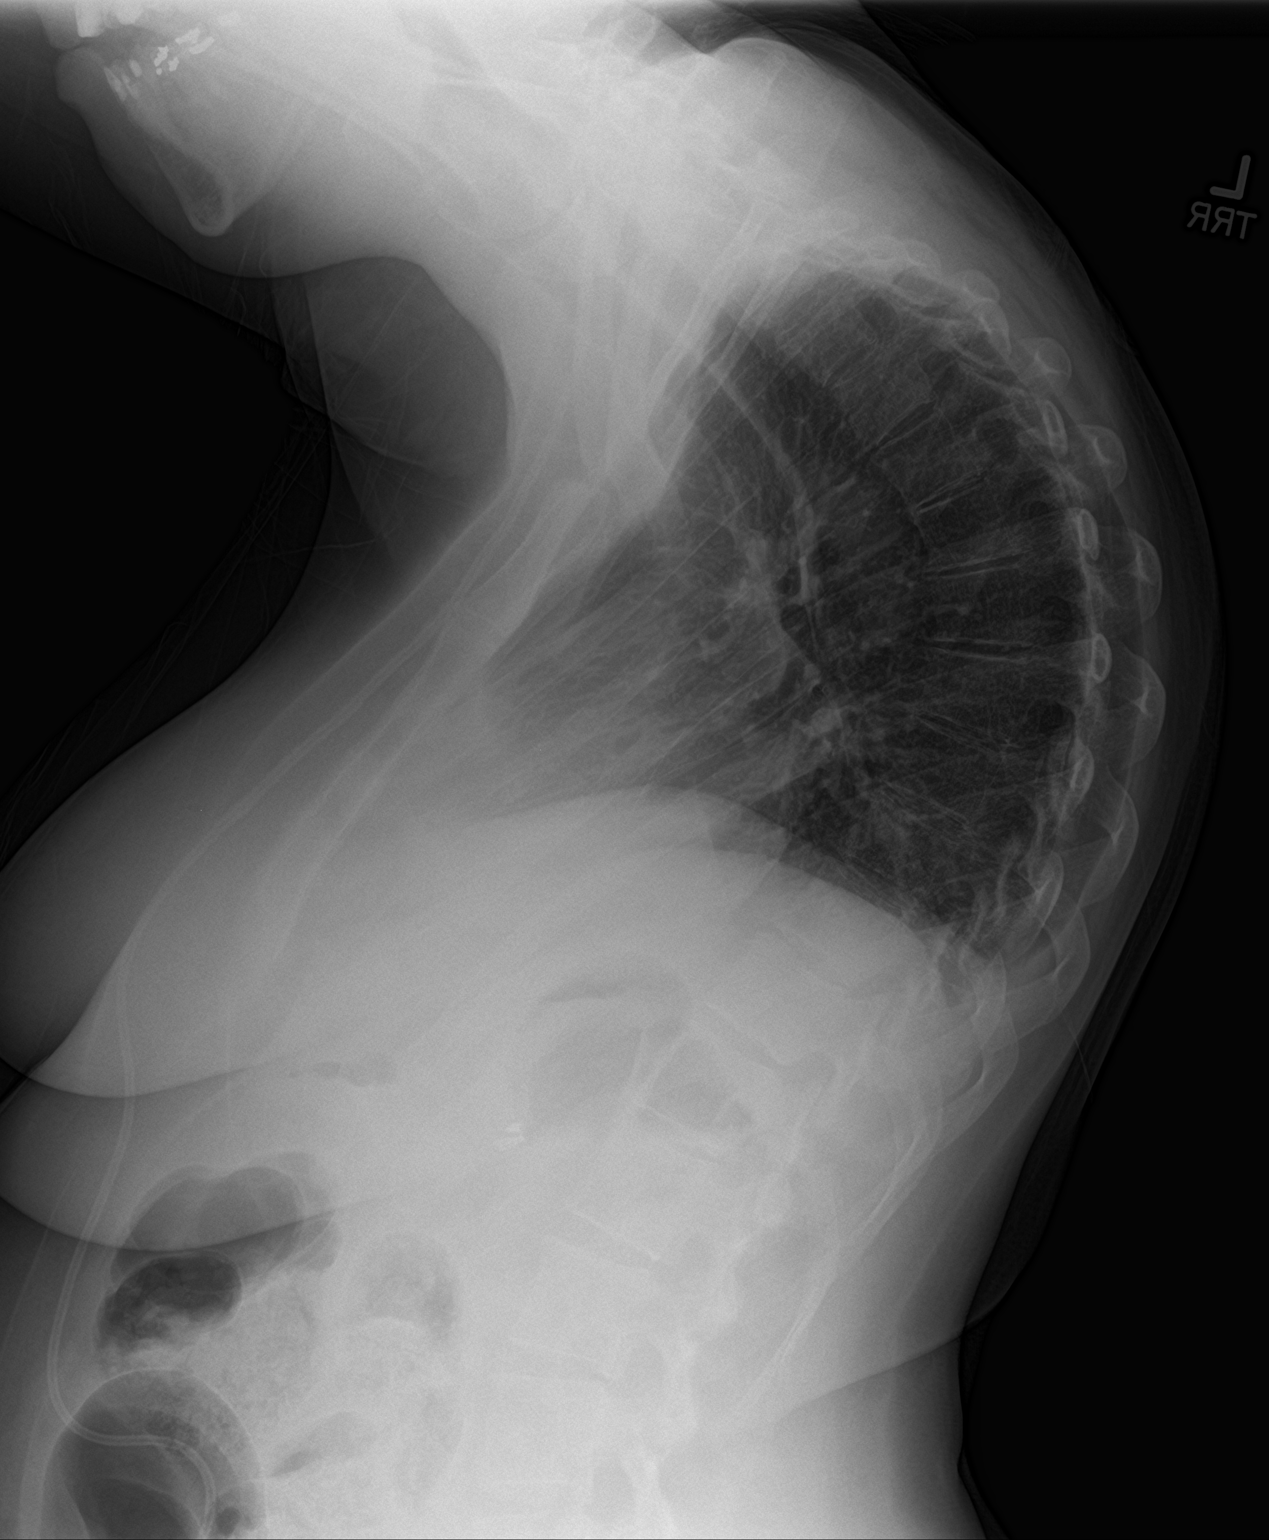

[2 of 2 positions shown; findings below may reference images not displayed]

FINDINGS: The heart size and mediastinal contours are within normal limits.
Both lungs are clear. The visualized skeletal structures are
unremarkable. Right ventriculoperitoneal shunt catheter is seen
projecting over the right neck, chest wall, and upper abdomen. Right
upper quadrant cholecystectomy clips.
IMPRESSION: No active cardiopulmonary disease.

By: Latoyia Dunn M.D.

## 2017-06-24 NOTE — Progress Notes (Signed)
Called to the bedside for self-extubation. Patient noted to be vomiting. Re-intubation attempted by RT without success. Code blue called for intubated. Patient re-intubated by ED physician Dr. Wilmon Pali. CXR, ABG and labs ordered and reviewed. Antibiotics broadened and patient started on pressors.   Patient is a ward of the [place]. Will contact POA in am  [facility] ANP-BC Pulmonary and Critical Care Medicine [facility] Pager [phone] or [phone]

## 2017-06-24 NOTE — Death Summary Note (Signed)
DEATH SUMMARY   Patient Details  Name: Lindsey Allison MRN: [id number] DOB: 05-12-1976  Admission/Discharge Information   Admit Date:  19-May-2017  Date of Death: Date of Death: 2017-05-27  Time of Death: Time of Death: 12/27/1516  Length of Stay: 8  Referring Physician: Hyman Hopes, MD   Reason(s) for Hospitalization  Severe respiratory failure from pneumonia  Diagnoses  Preliminary cause of death:  Secondary Diagnoses (including complications and co-morbidities):  Active Problems:   Sepsis (HCC)   Pneumonia of both lungs due to infectious organism respiratory failure Renal failure  Brief Hospital Course (including significant findings, care, treatment, and services provided and events leading to death)  Lindsey Allison is a 41 y.o. year old female who was admitted to ICU for severe respiratory failure Intubated for pneumonia/ARDS, in the setting of mental retardation. Patient is ward of the state.  DSS team was notified of clinical condition and they have consented and agreed to comfort care measures.  No family available  Pertinent Labs and Studies  Significant Diagnostic Studies Dg Chest 2 View  Result Date: 05-19-2017 CLINICAL DATA:  Cough and lethargy EXAM: CHEST  2 VIEW COMPARISON:  01/12/2017 FINDINGS: Cardiac shadow is within normal limits. The lungs are well aerated bilaterally. Patchy infiltrative changes are seen in the left mid and lower lung as well as the right lung base. No sizable effusion is seen. No bony abnormality is noted. Ventricular shunt is noted on the right. IMPRESSION: Bilateral infiltrates as described. Electronically Signed   By: Alcide Clever M.D.   On: 2017/05/19 12:37   Dg Abd 1 View  Result Date: 05/21/2017 CLINICAL DATA:  New onset of abdominal distention. EXAM: ABDOMEN - 1 VIEW COMPARISON:  05/20/2017. FINDINGS: The bowel gas pattern is normal. No radio-opaque calculi or other significant radiographic abnormality are seen.  Bibasilar lung opacities, incompletely evaluated. RIGHT-sided ventriculoperitoneal shunt catheter is unchanged. Orogastric tube tip lies within the stomach. Cholecystectomy clips. IMPRESSION: Stable exam. Electronically Signed   By: Elsie Stain M.D.   On: 05/21/2017 16:41   Ct Angio Chest Pe W Or Wo Contrast  Result Date: 05/18/2017 CLINICAL DATA:  Shortness of breath. EXAM: CT ANGIOGRAPHY CHEST WITH CONTRAST TECHNIQUE: Multidetector CT imaging of the chest was performed using the standard protocol during bolus administration of intravenous contrast. Multiplanar CT image reconstructions and MIPs were obtained to evaluate the vascular anatomy. CONTRAST:  75 mL of Isovue 370 intravenously. COMPARISON:  Radiographs of May 17, 2017. FINDINGS: Cardiovascular: Satisfactory opacification of the pulmonary arteries to the segmental level. No evidence of pulmonary embolism. Normal heart size. No pericardial effusion. Mediastinum/Nodes: No enlarged mediastinal, hilar, or axillary lymph nodes. Thyroid gland, trachea, and esophagus demonstrate no significant findings. Lungs/Pleura: No pneumothorax is noted. Large left upper and lower lobe airspace opacities are noted most consistent with pneumonia. Smaller right upper and lower lobe opacities are also noted most consistent with pneumonia. Mild bilateral pleural effusions are noted. Upper Abdomen: No acute abnormality. Musculoskeletal: No chest wall abnormality. No acute or significant osseous findings. Review of the MIP images confirms the above findings. IMPRESSION: No definite evidence of pulmonary embolus. Bilateral upper and lower lobe airspace opacities are noted most consistent with pneumonia. Mild bilateral pleural effusions are noted. Electronically Signed   By: Lupita Raider, M.D.   On: 05/18/2017 12:10   US Renal  Result Date: 05/22/2017 CLINICAL DATA:  41 year old female with acute kidney injury. EXAM: RENAL / URINARY TRACT ULTRASOUND COMPLETE  COMPARISON:  09/27/2012 and  prior CTs FINDINGS: Right Kidney: Length: 11.5 cm with mild cortical thinning. Echogenicity within normal limits. No mass or hydronephrosis visualized. Left Kidney: Length: 12.7 cm with mild cortical thinning. Echogenicity within normal limits. No mass or hydronephrosis visualized. Bladder: A Foley catheter within the bladder is noted. IMPRESSION: Mild bilateral renal cortical thinning without other significant renal abnormality. Electronically Signed   By: Harmon Pier M.D.   On: 05/22/2017 13:03   Dg Chest Port 1 View  Result Date: 05/31/2017 CLINICAL DATA:  Respiratory failure, sepsis, pneumonia. EXAM: PORTABLE CHEST 1 VIEW COMPARISON:  Portable chest x-ray of May 23, 2017 FINDINGS: Confluent alveolar opacities have worsened bilaterally. Air bronchograms are not clearly evident on the left. The cardiac silhouette is obscured. The pulmonary vascularity is also obscured. The endotracheal tube tip projects 2.8 cm above the carina. The esophagogastric tube tip in proximal port project below the inferior margin of the image. The left internal jugular venous catheter tip projects over the distal third of the SVC. A ventriculoperitoneal shunt to the right of midline is present. IMPRESSION: Worsening of bilateral airspace disease consistent with progressive pneumonia-ARDS. The support tubes are in reasonable position. Electronically Signed   By: David  [place] M.D.   On: 06/03/2017 07:14   Dg Chest Port 1 View  Result Date: 05/23/2017 CLINICAL DATA:  Evaluate ET tube placement EXAM: PORTABLE CHEST 1 VIEW COMPARISON:  05/23/2017 FINDINGS: The endotracheal tube tip is above the carina. There is a left IJ catheter with tip in the projection of the right atrium. The nasogastric tube extends below the level of the GE junction. There is a right-sided ventriculoperitoneal shunt catheter. Diffuse bilateral interstitial and airspace opacities identified compatible with ARDS. The appearance  is unchanged from prior exam. IMPRESSION: 1. No change in aeration a lungs compared with previous exam. 2. Support apparatus positioned as above. Electronically Signed   By: Signa Kell M.D.   On: 05/23/2017 23:10   Dg Chest Port 1 View  Result Date: 05/23/2017 CLINICAL DATA:  Respiratory failure EXAM: PORTABLE CHEST 1 VIEW COMPARISON:  05/22/2017 FINDINGS: Patient has a right ventriculoperitoneal shunt. Left IJ central line tip overlies the level of superior vena cava. Endotracheal tube is in place, tip approximately 1.9 cm above the carina. A nasogastric tube is in place, off the image and beyond the level of the mid stomach. The heart size is probably normal. There are patchy airspace filling opacities bilaterally, similar in appearance to prior study. IMPRESSION: Persistent significant bilateral airspace filling opacities. Electronically Signed   By: Norva Pavlov M.D.   On: 05/23/2017 08:33   Dg Chest Port 1 View  Result Date: 05/22/2017 CLINICAL DATA:  Respiratory failure EXAM: PORTABLE CHEST 1 VIEW COMPARISON:  05/20/2017 FINDINGS: Cardiomediastinal silhouette unchanged. An endotracheal tube with tip 1 cm above the carina, NG tube entering the stomach with tip off the field of view, and left IJ central venous catheter with tip overlying the superior cavoatrial junction again noted. Diffuse bilateral airspace opacities, left-greater-than-right, have slightly improved. No pneumothorax. IMPRESSION: Slightly decreased diffuse bilateral airspace opacities, otherwise unchanged appearance the chest. Electronically Signed   By: Harmon Pier M.D.   On: 05/22/2017 07:07   Dg Chest Port 1 View  Result Date: 05/20/2017 CLINICAL DATA:  Respiratory failure. EXAM: PORTABLE CHEST 1 VIEW COMPARISON:  05/19/2017. FINDINGS: Endotracheal tube, NG tube, left IJ line stable position. Heart size normal. Diffuse bilateral pulmonary infiltrates/edema again noted. No interim change. Small left pleural effusion cannot  be excluded. No pneumothorax.  IMPRESSION: 1. Lines and tubes in stable position. 2. Diffuse bilateral pulmonary infiltrates/edema again noted. No interim change. Small left pleural effusion cannot be excluded. Electronically Signed   By: Maisie Fus  Register   On: 05/20/2017 06:34   Dg Chest Port 1 View  Result Date: 05/19/2017 CLINICAL DATA:  The EXAM: PORTABLE CHEST 1 VIEW COMPARISON:  Prior radiograph from 05/18/2017. FINDINGS: Endotracheal tube in place with tip positioned 19 mm above the carina. Enteric tube courses in the abdomen. Left IJ approach centra venous catheter in place with tip in the proximal right atrium, stable. Cardiac and mediastinal silhouettes are unchanged. Patchy multifocal opacities with consolidation and air bronchograms again seen involving the bilateral lungs, left greater than right. There is increased opacity at the left lung base as compared to previous, with some improved aeration at the left upper lobe. Right lung relatively similar in appearance. No pleural effusion. No appreciable pneumothorax. Osseous structures unchanged. IMPRESSION: 1. Support apparatus in satisfactory position as above. 2. Persistent patchy multifocal airspace opacities, left greater than right, concerning for multilobar pneumonia. As compared to previous, there is worsened opacity at the left lung base, with slightly improved aeration at the left upper lung. Electronically Signed   By: Rise Mu M.D.   On: 05/19/2017 15:54   Dg Chest Port 1 View  Result Date: 05/18/2017 CLINICAL DATA:  Central line placement EXAM: PORTABLE CHEST 1 VIEW COMPARISON:  05/17/2017 FINDINGS: Left IJ approach central line catheter tip is seen at the cavoatrial junction. No pneumothorax. The tip of an endotracheal tube is 3.1 cm above the carina. Gastric tube extends below the left hemidiaphragm into the expected location of the stomach. VP shunt tubing projects over the right hemithorax and extends into the abdomen.  The tip is excluded on this study. Patchy pneumonic consolidations with air bronchograms are seen in both upper lobes, lingula and left lower lobe. Heart size is normal. The aorta is obscured by the adjacent pneumonia. IMPRESSION: 1. Satisfactory support line and tube positions. 2. Left IJ central line catheter seen with tip at the cavoatrial junction. No pneumothorax. 3. Patchy multilobar pneumonia with air bronchograms. Electronically Signed   By: Tollie Eth M.D.   On: 05/18/2017 19:47   Dg Chest Port 1 View  Result Date: 05/17/2017 CLINICAL DATA:  Cough EXAM: PORTABLE CHEST 1 VIEW COMPARISON:  Chest radiograph 06/01/17 at 12:07 p.m. FINDINGS: Marked worsening of consolidative opacity in the left mid lung compared to the earlier study. Aeration at the right lung base is improved. No pneumothorax or pleural effusion. IMPRESSION: Marked worsening of left mid lung consolidation, likely infection or aspiration. Electronically Signed   By: Deatra Robinson M.D.   On: 05/17/2017 02:18   Dg Abd Portable 1v  Result Date: 05/23/2017 CLINICAL DATA:  Endotracheal tube and OG tube placements. EXAM: PORTABLE ABDOMEN - 1 VIEW COMPARISON:  05/21/2017 FINDINGS: The enteric tube is present with tip in the left upper quadrant consistent with location in the body of the stomach. Ventricular peritoneal shunt tubing along the right chest in abdomen. Surgical clips in the right upper quadrant. Gas-filled small and large bowel without significant distention, likely indicating ileus. IMPRESSION: Enteric tube tip localizes to the left upper quadrant consistent with location in the body of the stomach. Gas-filled small and large bowel suggest ileus. Electronically Signed   By: Burman Nieves M.D.   On: 05/23/2017 23:22   Dg Abd Portable 1v  Result Date: 05/20/2017 CLINICAL DATA:  Orogastric tube placement. EXAM: PORTABLE ABDOMEN -  1 VIEW COMPARISON:  Radiographs of May 18, 2017. FINDINGS: The bowel gas pattern is  normal. No radio-opaque calculi or other significant radiographic abnormality are seen. Status post cholecystectomy. Distal tip of orogastric tube is seen in proximal stomach. 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 IMPRESSION: No evidence of bowel obstruction or ileus. Distal tip of orogastric tube is seen in proximal stomach. Electronically Signed   By: Lupita Raider, M.D.   On: 05/20/2017 14:39   Dg Abd Portable 1v  Result Date: 05/18/2017 CLINICAL DATA:  OG tube placement EXAM: PORTABLE ABDOMEN - 1 VIEW COMPARISON:  None. FINDINGS: The tip and side port of a gastric tube are seen below the left hemidiaphragm in the expected location of the stomach. The tip however appears to coiled back toward the GE junction band is approximately 2 cm from the GE junction. Scattered gas containing small and large bowel loops without obstructive pattern. No free air. Contrast is seen within both renal collecting systems likely from recent administration of IV contrast. VP shunt tip projects over the left lower quadrant. Cholecystectomy clips are seen in the right upper quadrant. IMPRESSION: 1. Gastric tube tip and side-port are seen in the expected location of stomach however the tip projects back toward the GE junction and is seen approximately 2 cm from the expected location of the gastroesophageal juncture. 2. VP shunt tubing tip is seen in the left lower quadrant of the abdomen. Electronically Signed   By: Tollie Eth M.D.   On: 05/18/2017 19:44    Microbiology Recent Results (from the past 240 hour(s))  Rapid strep screen     Status: None   Collection Time: 05/14/17  5:40 PM  Result Value Ref Range Status   Streptococcus, Group A Screen (Direct) NEGATIVE NEGATIVE Final    Comment: (NOTE) A Rapid Antigen test may result negative if the antigen level in the sample is below the detection level of this test. The FDA has not cleared this test as a stand-alone test therefore the rapid  antigen negative result has reflexed to a Group A Strep culture.   Culture, group A strep     Status: None   Collection Time: 05/14/17  5:40 PM  Result Value Ref Range Status   Specimen Description THROAT  Final   Special Requests NONE Reflexed from [id number]  Final   Culture   Final    NO GROUP A STREP (S.PYOGENES) ISOLATED Performed at [facility], [address] [place], [place] [address]    Report Status 05/17/2017 FINAL  Final  Culture, blood (routine x 2)     Status: Abnormal   Collection Time: 2017/06/06  1:58 PM  Result Value Ref Range Status   Specimen Description BLOOD LT ARM  Final   Special Requests   Final    BOTTLES DRAWN AEROBIC AND ANAEROBIC Blood Culture results may not be optimal due to an inadequate volume of blood received in culture bottles   Culture  Setup Time   Final    GRAM POSITIVE COCCI ANAEROBIC BOTTLE ONLY CRITICAL RESULT CALLED TO, READ BACK BY AND VERIFIED WITH: LISA KLUTTZ AT 1025 05/17/17 SDR    Culture (A)  Final    STAPHYLOCOCCUS SPECIES (COAGULASE NEGATIVE) THE SIGNIFICANCE OF ISOLATING THIS ORGANISM FROM A SINGLE SET OF BLOOD CULTURES WHEN MULTIPLE SETS ARE DRAWN IS UNCERTAIN. PLEASE NOTIFY THE MICROBIOLOGY DEPARTMENT WITHIN ONE WEEK IF SPECIATION AND SENSITIVITIES ARE REQUIRED. Performed at [facility], [address],  [place], [place] [address]    Report Status 05/19/2017 FINAL  Final  Blood Culture ID Panel (Reflexed)     Status: Abnormal   Collection Time: June 05, 2017  1:58 PM  Result Value Ref Range Status   Enterococcus species NOT DETECTED NOT DETECTED Final   Listeria monocytogenes NOT DETECTED NOT DETECTED Final   Staphylococcus species DETECTED (A) NOT DETECTED Final    Comment: Methicillin (oxacillin) resistant coagulase negative staphylococcus. Possible blood culture contaminant (unless isolated from more than one blood culture draw or clinical case suggests pathogenicity). No antibiotic treatment is indicated for blood   culture contaminants. CRITICAL RESULT CALLED TO, READ BACK BY AND VERIFIED WITH: LISA KLUTZ ON 05/17/17 AT 1025 SDR    Staphylococcus aureus NOT DETECTED NOT DETECTED Final   Methicillin resistance DETECTED (A) NOT DETECTED Final    Comment: CRITICAL RESULT CALLED TO, READ BACK BY AND VERIFIED WITH: LISA KLUTZ ON 05/17/17 AT 1025 SDR    Streptococcus species NOT DETECTED NOT DETECTED Final   Streptococcus agalactiae NOT DETECTED NOT DETECTED Final   Streptococcus pneumoniae NOT DETECTED NOT DETECTED Final   Streptococcus pyogenes NOT DETECTED NOT DETECTED Final   Acinetobacter baumannii NOT DETECTED NOT DETECTED Final   Enterobacteriaceae species NOT DETECTED NOT DETECTED Final   Enterobacter cloacae complex NOT DETECTED NOT DETECTED Final   Escherichia coli NOT DETECTED NOT DETECTED Final   Klebsiella oxytoca NOT DETECTED NOT DETECTED Final   Klebsiella pneumoniae NOT DETECTED NOT DETECTED Final   Proteus species NOT DETECTED NOT DETECTED Final   Serratia marcescens NOT DETECTED NOT DETECTED Final   Haemophilus influenzae NOT DETECTED NOT DETECTED Final   Neisseria meningitidis NOT DETECTED NOT DETECTED Final   Pseudomonas aeruginosa NOT DETECTED NOT DETECTED Final   Candida albicans NOT DETECTED NOT DETECTED Final   Candida glabrata NOT DETECTED NOT DETECTED Final   Candida krusei NOT DETECTED NOT DETECTED Final   Candida parapsilosis NOT DETECTED NOT DETECTED Final   Candida tropicalis NOT DETECTED NOT DETECTED Final  Culture, blood (routine x 2)     Status: None   Collection Time: Jun 05, 2017  3:26 PM  Result Value Ref Range Status   Specimen Description BLOOD RT ARM  Final   Special Requests   Final    BOTTLES DRAWN AEROBIC AND ANAEROBIC Blood Culture results may not be optimal due to an inadequate volume of blood received in culture bottles   Culture NO GROWTH 5 DAYS  Final   Report Status 05/21/2017 FINAL  Final  Urine culture     Status: None   Collection Time: 06-05-17   4:57 PM  Result Value Ref Range Status   Specimen Description URINE, CLEAN CATCH  Final   Special Requests NONE  Final   Culture   Final    NO GROWTH Performed at [facility], [address], [place], [place] [address]    Report Status 05/18/2017 FINAL  Final  Culture, blood (routine x 2) Call MD if unable to obtain prior to antibiotics being given     Status: None   Collection Time: June 05, 2017  5:54 PM  Result Value Ref Range Status   Specimen Description BLOOD LT WRIST  Final   Special Requests   Final    BOTTLES DRAWN AEROBIC AND ANAEROBIC Blood Culture adequate volume   Culture NO GROWTH 5 DAYS  Final   Report Status 05/21/2017 FINAL  Final  MRSA PCR Screening     Status: None   Collection Time: 2017-06-05  6:35  PM  Result Value Ref Range Status   MRSA by PCR NEGATIVE NEGATIVE Final    Comment:        The GeneXpert MRSA Assay (FDA approved for NASAL specimens only), is one component of a comprehensive MRSA colonization surveillance program. It is not intended to diagnose MRSA infection nor to guide or monitor treatment for MRSA infections.   Culture, blood (routine x 2) Call MD if unable to obtain prior to antibiotics being given     Status: None   Collection Time: 05/01/2017  7:10 PM  Result Value Ref Range Status   Specimen Description BLOOD RIGHT WRIST  Final   Special Requests   Final    BOTTLES DRAWN AEROBIC AND ANAEROBIC Blood Culture results may not be optimal due to an excessive volume of blood received in culture bottles   Culture NO GROWTH 5 DAYS  Final   Report Status 05/21/2017 FINAL  Final  Respiratory Panel by PCR     Status: None   Collection Time: 05/18/17  2:21 PM  Result Value Ref Range Status   Adenovirus NOT DETECTED NOT DETECTED Final   Coronavirus 229E NOT DETECTED NOT DETECTED Final   Coronavirus HKU1 NOT DETECTED NOT DETECTED Final   Coronavirus NL63 NOT DETECTED NOT DETECTED Final   Coronavirus OC43 NOT DETECTED NOT DETECTED Final    Metapneumovirus NOT DETECTED NOT DETECTED Final   Rhinovirus / Enterovirus NOT DETECTED NOT DETECTED Final   Influenza A NOT DETECTED NOT DETECTED Final   Influenza B NOT DETECTED NOT DETECTED Final   Parainfluenza Virus 1 NOT DETECTED NOT DETECTED Final   Parainfluenza Virus 2 NOT DETECTED NOT DETECTED Final   Parainfluenza Virus 3 NOT DETECTED NOT DETECTED Final   Parainfluenza Virus 4 NOT DETECTED NOT DETECTED Final   Respiratory Syncytial Virus NOT DETECTED NOT DETECTED Final   Bordetella pertussis NOT DETECTED NOT DETECTED Final   Chlamydophila pneumoniae NOT DETECTED NOT DETECTED Final   Mycoplasma pneumoniae NOT DETECTED NOT DETECTED Final    Comment: Performed at [facility], [address], [place], [place] [address]  Culture, respiratory (NON-Expectorated)     Status: None   Collection Time: 05/18/17  2:21 PM  Result Value Ref Range Status   Specimen Description SPUTUM  Final   Special Requests NONE  Final   Gram Stain   Final    ABUNDANT WBC PRESENT, PREDOMINANTLY MONONUCLEAR ABUNDANT YEAST Performed at [facility], [address] [place], [place] [address]    Culture MODERATE CANDIDA ALBICANS  Final   Report Status 05/21/2017 FINAL  Final  Culture, respiratory (NON-Expectorated)     Status: None   Collection Time: 05/19/17  1:40 AM  Result Value Ref Range Status   Specimen Description SPUTUM  Final   Special Requests NONE  Final   Gram Stain   Final    ABUNDANT WBC PRESENT, PREDOMINANTLY PMN MODERATE YEAST WITH PSEUDOHYPHAE Performed at [facility], [address], [place], [place] [address]    Culture FEW CANDIDA ALBICANS  Final   Report Status 05/21/2017 FINAL  Final  Culture, respiratory (NON-Expectorated)     Status: None (Preliminary result)   Collection Time: 2017/05/31  4:42 AM  Result Value Ref Range Status   Specimen Description TRACHEAL ASPIRATE  Final   Special Requests NONE  Final   Gram Stain   Final    FEW WBC PRESENT,  PREDOMINANTLY PMN NO ORGANISMS SEEN Performed at [facility], [address] [place], [place] [address]    Culture PENDING  Incomplete   Report Status PENDING  Incomplete  Culture, blood (Routine X 2) w Reflex to ID Panel     Status: None (Preliminary result)   Collection Time: 2017-06-16  5:03 AM  Result Value Ref Range Status   Specimen Description BLOOD RIGHT ARM  Final   Special Requests   Final    BOTTLES DRAWN AEROBIC AND ANAEROBIC Blood Culture adequate volume   Culture NO GROWTH <12 HOURS  Final   Report Status PENDING  Incomplete  Culture, blood (Routine X 2) w Reflex to ID Panel     Status: None (Preliminary result)   Collection Time: June 16, 2017  5:24 AM  Result Value Ref Range Status   Specimen Description BLOOD RIGHT HAND  Final   Special Requests   Final    BOTTLES DRAWN AEROBIC AND ANAEROBIC Blood Culture adequate volume   Culture NO GROWTH <12 HOURS  Final   Report Status PENDING  Incomplete    Lab Basic Metabolic Panel:  Recent Labs Lab 05/19/17 0508 05/19/17 1821 05/20/17 0418 05/20/17 2045 05/21/17 0414 05/21/17 2206 05/22/17 0414 05/22/17 1500 05/23/17 0527 05/23/17 2152 Jun 16, 2017 0438  NA 148*  --  144  --  143 146* 144 145 146* 146* 144  K 3.0* 3.5 3.5  --  3.2* 4.7 4.9 4.4 4.3 4.7 4.3  CL 120*  --  117*  --  114* 117* 116* 117* 116* 116* 115*  CO2 22  --  22  --  21* 21*  GLUCOSE 183*  --  164*  --  199* 154* 143* 131* 131* 122* 205*  BUN 24*  --  37*  --  56* 70* 71* 77* 74* 82* 81*  CREATININE 0.84  --  1.00  --  1.55* 1.88* 2.07* 2.14* 2.12* 2.40* 2.75*  CALCIUM 7.9*  --  7.1*  --  7.6* 7.8* 7.8* 7.9* 7.8* 7.9* 7.5*  MG 2.2  --   --   --  2.2 2.2  --   --   --  2.3  --   PHOS 2.5 1.5* 3.5 3.1  --  2.8  --   --   --  4.5  --    Liver Function Tests:  Recent Labs Lab 05/22/17 0414 05/23/17 2152  AST 74* 66*  ALT 81* 77*  ALKPHOS 120 121  BILITOT 0.5 0.7  PROT 4.1* 4.1*  ALBUMIN 1.6* 1.5*   No results for  input(s): LIPASE, AMYLASE in the last 168 hours. No results for input(s): AMMONIA in the last 168 hours. CBC:  Recent Labs Lab 05/18/17 1006  05/20/17 0418 05/21/17 0414 05/22/17 0414 05/23/17 0527 05/23/17 2100 06-16-17 0438  WBC 17.8*  < > 22.1* 20.9* 26.0* 21.7* 23.4* 14.9*  NEUTROABS 16.0*  --  20.6*  --  22.9*  --   --   --   HGB 12.7  < > 11.8* 11.6* 10.8* 10.6* 10.6* 11.4*  HCT 38.9  < > 36.6 35.3 32.8* 31.9* 33.4* 35.9  MCV 87.8  < > 87.9 89.5 89.8 88.9 90.5 93.7  PLT 79*  < > 58* 46* 57* 71* 88* 89*  < > = values in this interval not displayed. Cardiac Enzymes:  Recent Labs Lab 05/19/17 1355 05/21/17 2206 05/23/17 2255 06/16/17 0438  TROPONINI 0.07* 0.03* 0.04* 0.04*   Sepsis Labs:  Recent Labs Lab 05/18/17 1636  05/20/17 6045 05/21/17 0414 05/22/17 0414 05/23/17 4098  05/23/17 2100 06/21/2017 0438  PROCALCITON  --   < > 1.11 1.30  --  0.58  --  5.51  WBC  --   < > 22.1* 20.9* 26.0* 21.7* 23.4* 14.9*  LATICACIDVEN 1.7  --   --   --   --   --   --   --   < > = values in this interval not displayed.     Erin Fulling 06/23/2017, 3:43 PM

## 2017-06-24 NOTE — Progress Notes (Signed)
RN notified Dr. Belia Heman via telephone of patient's time of death and spoke with Dr. Luberta Mutter and made her aware.  RN also called and spoke with Blain Pais patient's legal guardian and made her aware that patient passed away.

## 2017-06-24 NOTE — Progress Notes (Addendum)
Alerted Lindsey Allison to troponin result: 0.04 and decreasing UOP: 2 mL for 0000 post 1 L bolus. Will continue to monitor pt. Closely.

## 2017-06-24 NOTE — Progress Notes (Signed)
Pharmacy Antibiotic Note  Lindsey Allison is a 41 y.o. female admitted on 03-Jun-2017 with pneumonia.  Pharmacy has been consulted for vancomycin dosing.  Plan: Patient received vanc 1g IV x 1  Patient is being restarted on vanc considering CXR not improving neither is patient status. Will start vanc maintenance of vanc 1g IV q24h w/ 6 hour stack dose. Will draw vanc trough 10/04 @ 1100.  Ke 0.02788 T1/2 24 hrs Goal trough 15 - 20 mcg/mL  Height:  (167.6 cm) Weight: 174 lb 6.1 oz (79.1 kg) IBW/kg (Calculated) : 59.3  Temp (24hrs), Avg:98.6 F (37 C), Min:96.8 F (36 C), Max:99.2 F (37.3 C)   Recent Labs Lab 05/18/17 1208 05/18/17 1636  05/21/17 0414  05/22/17 0414 05/22/17 1500 05/23/17 0527 05/23/17 2100 05/23/17 2152 06/20/2017 0438  WBC  --   --   < > 20.9*  --  26.0*  --  21.7* 23.4*  --  14.9*  CREATININE  --   --   < > 1.55*  < > 2.07* 2.14* 2.12*  --  2.40* 2.75*  LATICACIDVEN  --  1.7  --   --   --   --   --   --   --   --   --   VANCOTROUGH 37*  --   --   --   --   --   --   --   --   --   --   VANCORANDOM  --   --   --   --   --   --   --   --   --   --  5  < > = values in this interval not displayed.  Estimated Creatinine Clearance: 28.3 mL/min (A) (by C-G formula based on SCr of 2.75 mg/dL (H)).    Allergies  Allergen Reactions  . Zosyn [Piperacillin Sod-Tazobactam So] Rash  . Belladonna Alkaloids Other (See Comments)    Unsure of reaction Unsure of reaction    Thank you for allowing pharmacy to be a part of this patient's care.  Thomasene Ripple, PharmD, BCPS Clinical Pharmacist 06/11/2017

## 2017-06-24 NOTE — Clinical Social Work Note (Signed)
CSW contacted in order to notify CSW that patient has been made comfort care at this time. York Spaniel MSW,LCSW [phone]

## 2017-06-24 NOTE — Progress Notes (Addendum)
RN made Dr. Belia Heman aware that patient's MAP is 57-60 and is on 65 mcg/min of levophed which is the ordered max dose.  MD gave order to go up on levophed with no max dose.

## 2017-06-24 NOTE — Progress Notes (Signed)
RN called and spoke with Donna(nurse for [employer] services) and made her aware of decision for comfort care made by DSS.  RN asked if any caregivers from [facility] would want to come at time of taking patient off ventilator.  Lupita Leash stated she would get in touch with the team leader for patient's group home and call this RN back.

## 2017-06-24 NOTE — Progress Notes (Signed)
Several people from [employer] at bedside, waiting on long time caregiver Bjorn Loser to arrive prior to starting morphine drip and taking patient off the ventilator.

## 2017-06-24 NOTE — Progress Notes (Signed)
Patient was asystole per cardiac monitor but still taking agonal breaths.  This RN and Sarah, RN listened for heart tones after patient stopped taking agonal breaths and time of death pronounced at 1518.  This RN notified Annice Pih, Environmental education officer.

## 2017-06-24 NOTE — Care Management (Signed)
Self extubated and failed- re intubated 9/30. . Levophed. Legal guardian being contacted to discuss care options. Palliative care involvement may be of benefit.

## 2017-06-24 NOTE — Progress Notes (Signed)
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 Transferred to ICU/SDU with worsening hypoxemic respiratory failure requiring intubation. CXR consistent with severe ARDS.  MAJOR EVENTS/TEST RESULTS: 09/23 admitted to hospitalist service. Treated with vancomycin/Zosyn. 09/25 CTA chest: Extensive bilateral airspace consolidation consistent with ARDS 09/25 transferred to ICU/SDU and required intubation. 09/28 worsening renal function with oliguria 09/29 transient AFRVR. Creatinine has stabilized. Urine output improved. Gas exchange improved 09/30: Self-extubated and re-intubated>septic shock  INDWELLING DEVICES:: ETT 09/25 >>  L IJ CVL 09/25 >>   MICRO DATA: MRSA PCR 09/23 >> NEG Urine 09/23 >> NEG Resp virus panel 09/25 >> NEG Resp 09/25 >> moderate candida albicans Blood 09/23 >> 1/2 coag neg staph Resp 09/26 >> Few candida albicans  ANTIMICROBIALS:  Vanc 09/23 >> 09/25; restarted 10/01 Flagyl 9/30> Pip-tazo 09/23 >> 09/28 Fluconazole 09/28 >>  Meropenem 09/28 >>   SUBJECTIVE:  Self-extubated last night. Difficult aiway; re-intubated by ED MD. Now in worsening MOF.  RASS -3. Not F/C. Synchronous with ventilator  VITAL SIGNS: BP (!) 88/53   Pulse 74   Temp (!) 96.8 F (36 C) (Axillary)   Resp 20   Ht  (1.676 m)   Wt 174 lb 6.1 oz (79.1 kg)   SpO2 (!) 89%   BMI 28.15 kg/m   HEMODYNAMICS:   VENTILATOR SETTINGS: Vent Mode: PCV FiO2 (%):  [60 %-100 %] 100 % Set Rate:  [15 bmp-20 bmp] 20 bmp Vt Set:  [500 mL] 500 mL PEEP:  [10 cmH20-15 cmH20] 14 cmH20  INTAKE / OUTPUT: I/O last 3 completed shifts: In: 3163.8 [I.V.:582.1; Other:45; NG/GT:2036.7; IV Piggyback:500] Out: 3110 [Urine:2710; Stool:400]  PHYSICAL EXAMINATION: General: Intubated,  sedated, RASS -3 Neuro: CNs intact, MAEs HEENT: NCAT, multiple bleeding lesions around the mouth/lips Cardiovascular: RRR, S1/S2, no MRG, +2 pulses, +1edema Lungs: Coarse bilateral rhonchi, no wheezes Abdomen: Distended, soft, no masses, diminished bowel sounds Extremities: symmetric pedal and ankle edema Skin: Diffuse erythematous rash - less severe but persists  LABS:  BMET  Recent Labs Lab 05/23/17 0527 05/23/17 2152 06/17/17 0438  NA 146* 146* 144  K 4.3 4.7 4.3  CL 116* 116* 115*  CO2 23 21* 21*  BUN 74* 82* 81*  CREATININE 2.12* 2.40* 2.75*  GLUCOSE 131* 122* 205*    Electrolytes  Recent Labs Lab 05/20/17 2045 05/21/17 0414 05/21/17 2206  05/23/17 0527 05/23/17 2152 June 17, 2017 0438  CALCIUM  --  7.6* 7.8*  < > 7.8* 7.9* 7.5*  MG  --  2.2 2.2  --   --  2.3  --   PHOS 3.1  --  2.8  --   --  4.5  --   < > = values in this interval not displayed.  CBC  Recent Labs Lab 05/23/17 0527 05/23/17 2100 June 17, 2017 0438  WBC 21.7* 23.4* 14.9*  HGB 10.6* 10.6* 11.4*  HCT 31.9* 33.4* 35.9  PLT 71* 88* 89*    Coag's  Recent Labs Lab 05/23/17 2152  APTT 28  INR 1.33    Sepsis Markers  Recent Labs Lab 05/18/17 1636  05/21/17 0414 05/23/17 0527 2017-06-17 0438  LATICACIDVEN 1.7  --   --   --   --   PROCALCITON  --   < > 1.30 0.58 5.51  < > = values  in this interval not displayed.  ABG  Recent Labs Lab 05/18/17 1503 05/20/17 0442 05/23/17 2049  PHART 7.41 7.26* 7.22*  PCO2ART 37 43 51*  PO2ART 39* 61* 136*    Liver Enzymes  Recent Labs Lab 05/22/17 0414 05/23/17 2152  AST 74* 66*  ALT 81* 77*  ALKPHOS 120 121  BILITOT 0.5 0.7  ALBUMIN 1.6* 1.5*    Cardiac Enzymes  Recent Labs Lab 05/21/17 2206 05/23/17 2255 06-01-17 0438  TROPONINI 0.03* 0.04* 0.04*    Glucose  Recent Labs Lab 05/23/17 0757 05/23/17 1155 05/23/17 1601 05/23/17 1952 05/23/17 2328 06-01-2017 0356  GLUCAP 112* 137* 112* 103* 92 82    CXR: NSC ARDS  pattern   ASSESSMENT / PLAN:  PULMONARY A: Acute hypoxemic respiratory failure ARDS Self-extubation requiring re-intubation P:   Cont vent support - settings reviewed and/or adjusted Cont vent bundle Daily SBT if/when meets criteria Changed to PCV mode 09/28 to improve ventilator synchrony ARDS protocol PEEP/FiO2 algorithm initiated 09/29 Repeat sputum culture  CARDIOVASCULAR A:  Paroxysmal atrial fibrillation- Sinus tachycardia Septic  P:  Initiate scheduled metoprolol 09/30-rate controlled Low dose IV metoprolol to maintain HR < 115/min  RENAL A:   AKI Oliguria Hypervolemia Hypernatremia P:   Monitor BMET intermittently Monitor I/Os Correct electrolytes as indicated  Continue diuresis as permitted by BP and renal function Hold diuresis in light of shock Nephrology consult   GASTROINTESTINAL A:   Abdominal distention - improved P:   Hold tube feeds for x 24hrs and resume and titrate to goal as tolerated  HEMATOLOGIC A:   Thrombocytopenia - stable to improving P:  DVT px: SCDs Monitor CBC intermittently Transfuse per usual guidelines   INFECTIOUS A:   Severe sepsis Pneumonia, NOS Elevated PCT - improving P:   Monitor temp, WBC count Antibiotics broadened Pan cultured Trend PCT  ENDOCRINE A:   Stress induced hyperglycemia P:   Continue SSI  NEUROLOGIC A:   Mild mental retardation ICU/vent associated discomfort P:   RASS goal: -1, -2 Continue PAD protocol   FAMILY: She has no family involved in her life and is a ward of the state. I spoke with her guardian 09/28. She understands that this might require trach/G tube/SNF  Magdalene S. [facility] ANP-BC Pulmonary and Critical Care Medicine [facility] Pager [phone] or [phone] 06-01-2017, 6:29 AM

## 2017-06-24 NOTE — Progress Notes (Signed)
Pt. Suctioned for no secretions before extubation. Per Dr. Clovis Fredrickson order, she was extubated to comfort care.

## 2017-06-24 NOTE — Progress Notes (Signed)
I have assessed patients clinical status and the patient has no family per DSS personal. I have dicussed the grave prognosis with severe ARDS and respiratory failure in the setting of progressive renal failure. At this point patient has multiorgan failure with very poor prognosis in the setting of severe cognitive dysfunction with patient patient is a state of the wart I have discussed the clinical status with 2 team members from DSS-2 members of the DSS team had stated and provided me with information that they have tried to contact a sister and an aunt of the patient and were not able to discuss and to reach any family members. I was also informed that patient has no children patient,  is not married and patient has no mother, father or grandparents.  The Team Members that were part of the discussion include the following- Albina Billet [phone] [facility] [phone]  I have also asked for the documentation that supports that the DSS is a legal guardian of the patient.   I have explained to the DSS team that patient will need several procedures and ongoing therapy to survive which includes tracheostomy feeding tube and hemodialysis. And after explaining to them that this is a long-term process and prognosis is very poor especially with her underlying mental and cognitive dysfunction, her quality of life will be poor. I have also explained is that we can also proceed with making the patient comfortable in the setting of multiorgan failure with  severe ARDS and respiratory failure and renal failure.  After further assessment and evaluation,  all 3 members of the DSS team have agreed and consented to comfort care measures. We will proceed with a DNR/DNI status and will place orders for comfort care.     Lucie Leather, M.D.  [facility]  Medical Director [facility] Medical Director [facility] Cardio-Pulmonary Department

## 2017-06-24 NOTE — Progress Notes (Signed)
CH made a follow up visit. RN paged and stated they were extubating the Pt. CH spent time with Pt and several special family members. Music was played to sing the Pt to heaven. Pt expired. CH offered prayer and comfort.

## 2017-06-24 NOTE — Progress Notes (Signed)
[employer] [facility] [place] [facility]                                                                                                                                                                                  Patient Demographics   Lindsey Allison, is a 41 y.o. female, DOB - 05/16/1976, QVZ:[id number]  Admit date - 15-Jun-2017   Admitting Physician Ramonita Lab, MD  Outpatient Primary MD for the patient is Burns, Shellia Cleverly, MD   LOS - 8  CODE BLUE was called last night, she self extubated last night, reintubated Patient did not lose pulse. Difficulty to patient again. Patient condition discussed with DSS guardian by critical care physician, decision is made to pursue comfort care measures.  Review of Systems:   CONSTITUTIONAL:limited Due to patient's illness  Vitals:   Vitals:   05/29/2017 1115 05/27/2017 1130 06/17/2017 1145 06/11/2017 1200  BP: (!) 82/71 (!) 87/53 (!) 92/56 (!) 87/56  Pulse: 82 82 83 84  Resp: 20 20 20 20   Temp:      TempSrc:      SpO2: 90% 90% 90% 90%  Weight:      Height:        Wt Readings from Last 3 Encounters:  05/22/17 79.1 kg (174 lb 6.1 oz)  04/04/17 64.4 kg (142 lb)  01/12/17 65.8 kg (145 lb)     Intake/Output Summary (Last 24 hours) at 06/21/2017 1401 Last data filed at 05/31/2017 1100  Gross per 24 hour  Intake          3713.93 ml  Output              770 ml  Net          2943.93 ml    Physical Exam:   GENERAL: intubated  HEAD, EYES, EARS, NOSE AND THROAT: Bilateral conjunctival erythema lip swelling NECK: Supple. There is no jugular venous distention. No bruits, no lymphadenopathy, no thyromegaly.  HEART: Regular rate and rhythm,. No murmurs, no rubs, no clicks.  LUNGS: decreased breath sounds bilaterally ABDOMEN: Soft, flat, nontender, nondistended. Has good bowel sounds. No hepatosplenomegaly appreciated.  EXTREMITIES: No evidence of any cyanosis, clubbing, or peripheral edema.  +2 pedal and radial pulses bilaterally.   NEUROLOGIC: intubated SKIN:  Psych: intubated LN: No inguinal LN enlargement    Antibiotics   Anti-infectives    Start     Dose/Rate Route Frequency Ordered Stop   06/15/2017 1200  vancomycin (VANCOCIN) IVPB 1000 mg/200 mL premix     1,000 mg 200 mL/hr over 60 Minutes Intravenous Every 24 hours 06/16/2017 0649     06/04/2017 0600  vancomycin (VANCOCIN) IVPB 1000 mg/200 mL  premix     1,000 mg 200 mL/hr over 60 Minutes Intravenous  Once 06/07/2017 0549 06/05/2017 0722   05/31/2017 0430  vancomycin (VANCOCIN) IVPB 1000 mg/200 mL premix  Status:  Discontinued     1,000 mg 200 mL/hr over 60 Minutes Intravenous  Once 06/09/2017 0423 06/06/2017 0427   05/23/17 2200  metroNIDAZOLE (FLAGYL) IVPB 500 mg     500 mg 100 mL/hr over 60 Minutes Intravenous Every 8 hours 05/23/17 2115     05/22/17 1000  fluconazole (DIFLUCAN) IVPB 400 mg     400 mg 100 mL/hr over 120 Minutes Intravenous Every 24 hours 05/21/17 1113     05/21/17 1500  meropenem (MERREM) 1 g in sodium chloride 0.9 % 100 mL IVPB     1 g 200 mL/hr over 30 Minutes Intravenous Every 8 hours 05/21/17 1438     05/21/17 1200  fluconazole (DIFLUCAN) IVPB 800 mg     800 mg 100 mL/hr over 240 Minutes Intravenous  Once 05/21/17 1113 05/21/17 1528   05/18/17 0900  piperacillin-tazobactam (ZOSYN) IVPB 3.375 g  Status:  Discontinued     3.375 g 12.5 mL/hr over 240 Minutes Intravenous Every 8 hours 05/18/17 0851 05/21/17 1433   05/17/17 0200  vancomycin (VANCOCIN) IVPB 750 mg/150 ml premix  Status:  Discontinued     750 mg 150 mL/hr over 60 Minutes Intravenous Every 8 hours 2017-05-21 1857 21-May-2017 1902   2017/05/21 2100  ceFEPIme (MAXIPIME) 2 g in dextrose 5 % 50 mL IVPB  Status:  Discontinued     2 g 100 mL/hr over 30 Minutes Intravenous Every 8 hours 21-May-2017 1740 05/18/17 0851   05/21/17 2100  vancomycin (VANCOCIN) IVPB 750 mg/150 ml premix  Status:  Discontinued     750 mg 150 mL/hr over 60 Minutes Intravenous Every 8 hours 05-21-17 1902 05/18/17 0851    05/21/17 2000  vancomycin (VANCOCIN) IVPB 750 mg/150 ml premix  Status:  Discontinued     750 mg 150 mL/hr over 60 Minutes Intravenous  Once May 21, 2017 1856 2017/05/21 1859   21-May-2017 1416  piperacillin-tazobactam (ZOSYN) 3-0.375 GM/50ML IVPB    Comments:  Hatch, Shannin   : cabinet override      May 21, 2017 1416 05/17/17 0229   05-21-2017 1400  vancomycin (VANCOCIN) IVPB 1000 mg/200 mL premix     1,000 mg 200 mL/hr over 60 Minutes Intravenous  Once May 21, 2017 1350 21-May-2017 1643   2017-05-21 1345  vancomycin (VANCOCIN) injection 1 g  Status:  Discontinued     1 g Intravenous  Once 05/21/2017 1345 05/21/2017 1350   2017/05/21 1345  piperacillin-tazobactam (ZOSYN) IVPB 3.375 g     3.375 g 100 mL/hr over 30 Minutes Intravenous  Once 05-21-2017 1345 05-21-17 1504      Medications   Scheduled Meds: . chlorhexidine  15 mL Mouth/Throat BID  . famotidine  20 mg Per Tube Daily  . free water  300 mL Per Tube Q6H  . hydrocortisone cream   Topical BID  . insulin aspart  0-15 Units Subcutaneous Q4H  . mouth rinse  15 mL Mouth Rinse 10 times per day  . metoprolol tartrate  25 mg Per Tube BID  . sodium chloride flush  10-40 mL Intracatheter Q12H   Continuous Infusions: . feeding supplement (VITAL AF 1.2 CAL) Stopped (05/23/17 2010)  . fentaNYL infusion INTRAVENOUS 250 mcg/hr (06/08/2017 1610)  . fluconazole (DIFLUCAN) IV Stopped (06/09/2017 1202)  . meropenem (MERREM) IV Stopped (06/23/2017 9604)  . metronidazole Stopped (06/08/2017 0722)  .  morphine    . norepinephrine (LEVOPHED) Adult infusion 82 mcg/min (05/29/2017 1002)  . propofol (DIPRIVAN) infusion 35 mcg/kg/min (05/29/2017 1002)  . vancomycin    . vasopressin (PITRESSIN) infusion - *FOR SHOCK*     PRN Meds:.acetaminophen, metoprolol tartrate, midazolam, midazolam, morphine, sodium chloride flush   Data Review:   Micro Results Recent Results (from the past 240 hour(s))  Rapid strep screen     Status: None   Collection Time: 05/14/17  5:40 PM  Result  Value Ref Range Status   Streptococcus, Group A Screen (Direct) NEGATIVE NEGATIVE Final    Comment: (NOTE) A Rapid Antigen test may result negative if the antigen level in the sample is below the detection level of this test. The FDA has not cleared this test as a stand-alone test therefore the rapid antigen negative result has reflexed to a Group A Strep culture.   Culture, group A strep     Status: None   Collection Time: 05/14/17  5:40 PM  Result Value Ref Range Status   Specimen Description THROAT  Final   Special Requests NONE Reflexed from [id number]  Final   Culture   Final    NO GROUP A STREP (S.PYOGENES) ISOLATED Performed at [facility], [address] [place], [place] [address]    Report Status 05/17/2017 FINAL  Final  Culture, blood (routine x 2)     Status: Abnormal   Collection Time: 04/25/2017  1:58 PM  Result Value Ref Range Status   Specimen Description BLOOD LT ARM  Final   Special Requests   Final    BOTTLES DRAWN AEROBIC AND ANAEROBIC Blood Culture results may not be optimal due to an inadequate volume of blood received in culture bottles   Culture  Setup Time   Final    GRAM POSITIVE COCCI ANAEROBIC BOTTLE ONLY CRITICAL RESULT CALLED TO, READ BACK BY AND VERIFIED WITH: LISA KLUTTZ AT 1025 05/17/17 SDR    Culture (A)  Final    STAPHYLOCOCCUS SPECIES (COAGULASE NEGATIVE) THE SIGNIFICANCE OF ISOLATING THIS ORGANISM FROM A SINGLE SET OF BLOOD CULTURES WHEN MULTIPLE SETS ARE DRAWN IS UNCERTAIN. PLEASE NOTIFY THE MICROBIOLOGY DEPARTMENT WITHIN ONE WEEK IF SPECIATION AND SENSITIVITIES ARE REQUIRED. Performed at [facility], [address] [place], [place] [address]    Report Status 05/19/2017 FINAL  Final  Blood Culture ID Panel (Reflexed)     Status: Abnormal   Collection Time: 04/26/2017  1:58 PM  Result Value Ref Range Status   Enterococcus species NOT DETECTED NOT DETECTED Final   Listeria monocytogenes NOT DETECTED NOT DETECTED Final   Staphylococcus  species DETECTED (A) NOT DETECTED Final    Comment: Methicillin (oxacillin) resistant coagulase negative staphylococcus. Possible blood culture contaminant (unless isolated from more than one blood culture draw or clinical case suggests pathogenicity). No antibiotic treatment is indicated for blood  culture contaminants. CRITICAL RESULT CALLED TO, READ BACK BY AND VERIFIED WITH: LISA KLUTZ ON 05/17/17 AT 1025 SDR    Staphylococcus aureus NOT DETECTED NOT DETECTED Final   Methicillin resistance DETECTED (A) NOT DETECTED Final    Comment: CRITICAL RESULT CALLED TO, READ BACK BY AND VERIFIED WITH: LISA KLUTZ ON 05/17/17 AT 1025 SDR    Streptococcus species NOT DETECTED NOT DETECTED Final   Streptococcus agalactiae NOT DETECTED NOT DETECTED Final   Streptococcus pneumoniae NOT DETECTED NOT DETECTED Final   Streptococcus pyogenes NOT DETECTED NOT DETECTED Final   Acinetobacter baumannii NOT DETECTED NOT DETECTED Final  Enterobacteriaceae species NOT DETECTED NOT DETECTED Final   Enterobacter cloacae complex NOT DETECTED NOT DETECTED Final   Escherichia coli NOT DETECTED NOT DETECTED Final   Klebsiella oxytoca NOT DETECTED NOT DETECTED Final   Klebsiella pneumoniae NOT DETECTED NOT DETECTED Final   Proteus species NOT DETECTED NOT DETECTED Final   Serratia marcescens NOT DETECTED NOT DETECTED Final   Haemophilus influenzae NOT DETECTED NOT DETECTED Final   Neisseria meningitidis NOT DETECTED NOT DETECTED Final   Pseudomonas aeruginosa NOT DETECTED NOT DETECTED Final   Candida albicans NOT DETECTED NOT DETECTED Final   Candida glabrata NOT DETECTED NOT DETECTED Final   Candida krusei NOT DETECTED NOT DETECTED Final   Candida parapsilosis NOT DETECTED NOT DETECTED Final   Candida tropicalis NOT DETECTED NOT DETECTED Final  Culture, blood (routine x 2)     Status: None   Collection Time: 05/03/2017  3:26 PM  Result Value Ref Range Status   Specimen Description BLOOD RT ARM  Final   Special  Requests   Final    BOTTLES DRAWN AEROBIC AND ANAEROBIC Blood Culture results may not be optimal due to an inadequate volume of blood received in culture bottles   Culture NO GROWTH 5 DAYS  Final   Report Status 05/21/2017 FINAL  Final  Urine culture     Status: None   Collection Time: 05/03/2017  4:57 PM  Result Value Ref Range Status   Specimen Description URINE, CLEAN CATCH  Final   Special Requests NONE  Final   Culture   Final    NO GROWTH Performed at [facility], [address], [place], [place] [address]    Report Status 05/18/2017 FINAL  Final  Culture, blood (routine x 2) Call MD if unable to obtain prior to antibiotics being given     Status: None   Collection Time: 04/25/2017  5:54 PM  Result Value Ref Range Status   Specimen Description BLOOD LT WRIST  Final   Special Requests   Final    BOTTLES DRAWN AEROBIC AND ANAEROBIC Blood Culture adequate volume   Culture NO GROWTH 5 DAYS  Final   Report Status 05/21/2017 FINAL  Final  MRSA PCR Screening     Status: None   Collection Time: 05/02/2017  6:35 PM  Result Value Ref Range Status   MRSA by PCR NEGATIVE NEGATIVE Final    Comment:        The GeneXpert MRSA Assay (FDA approved for NASAL specimens only), is one component of a comprehensive MRSA colonization surveillance program. It is not intended to diagnose MRSA infection nor to guide or monitor treatment for MRSA infections.   Culture, blood (routine x 2) Call MD if unable to obtain prior to antibiotics being given     Status: None   Collection Time: 05/15/2017  7:10 PM  Result Value Ref Range Status   Specimen Description BLOOD RIGHT WRIST  Final   Special Requests   Final    BOTTLES DRAWN AEROBIC AND ANAEROBIC Blood Culture results may not be optimal due to an excessive volume of blood received in culture bottles   Culture NO GROWTH 5 DAYS  Final   Report Status 05/21/2017 FINAL  Final  Respiratory Panel by PCR     Status: None   Collection Time: 05/18/17   2:21 PM  Result Value Ref Range Status   Adenovirus NOT DETECTED NOT DETECTED Final   Coronavirus 229E NOT DETECTED NOT DETECTED Final   Coronavirus HKU1 NOT DETECTED  NOT DETECTED Final   Coronavirus NL63 NOT DETECTED NOT DETECTED Final   Coronavirus OC43 NOT DETECTED NOT DETECTED Final   Metapneumovirus NOT DETECTED NOT DETECTED Final   Rhinovirus / Enterovirus NOT DETECTED NOT DETECTED Final   Influenza A NOT DETECTED NOT DETECTED Final   Influenza B NOT DETECTED NOT DETECTED Final   Parainfluenza Virus 1 NOT DETECTED NOT DETECTED Final   Parainfluenza Virus 2 NOT DETECTED NOT DETECTED Final   Parainfluenza Virus 3 NOT DETECTED NOT DETECTED Final   Parainfluenza Virus 4 NOT DETECTED NOT DETECTED Final   Respiratory Syncytial Virus NOT DETECTED NOT DETECTED Final   Bordetella pertussis NOT DETECTED NOT DETECTED Final   Chlamydophila pneumoniae NOT DETECTED NOT DETECTED Final   Mycoplasma pneumoniae NOT DETECTED NOT DETECTED Final    Comment: Performed at [facility], [address], [place], [place] [address]  Culture, respiratory (NON-Expectorated)     Status: None   Collection Time: 05/18/17  2:21 PM  Result Value Ref Range Status   Specimen Description SPUTUM  Final   Special Requests NONE  Final   Gram Stain   Final    ABUNDANT WBC PRESENT, PREDOMINANTLY MONONUCLEAR ABUNDANT YEAST Performed at [facility], [address], [place], [place] [address]    Culture MODERATE CANDIDA ALBICANS  Final   Report Status 05/21/2017 FINAL  Final  Culture, respiratory (NON-Expectorated)     Status: None   Collection Time: 05/19/17  1:40 AM  Result Value Ref Range Status   Specimen Description SPUTUM  Final   Special Requests NONE  Final   Gram Stain   Final    ABUNDANT WBC PRESENT, PREDOMINANTLY PMN MODERATE YEAST WITH PSEUDOHYPHAE Performed at [facility], [address], [place], [place] [address]    Culture FEW CANDIDA ALBICANS  Final   Report Status 05/21/2017  FINAL  Final  Culture, respiratory (NON-Expectorated)     Status: None (Preliminary result)   Collection Time: 06/22/2017  4:42 AM  Result Value Ref Range Status   Specimen Description TRACHEAL ASPIRATE  Final   Special Requests NONE  Final   Gram Stain   Final    FEW WBC PRESENT, PREDOMINANTLY PMN NO ORGANISMS SEEN Performed at [facility], [address], [place], [place] [address]    Culture PENDING  Incomplete   Report Status PENDING  Incomplete  Culture, blood (Routine X 2) w Reflex to ID Panel     Status: None (Preliminary result)   Collection Time: 05/26/2017  5:03 AM  Result Value Ref Range Status   Specimen Description BLOOD RIGHT ARM  Final   Special Requests   Final    BOTTLES DRAWN AEROBIC AND ANAEROBIC Blood Culture adequate volume   Culture NO GROWTH <12 HOURS  Final   Report Status PENDING  Incomplete  Culture, blood (Routine X 2) w Reflex to ID Panel     Status: None (Preliminary result)   Collection Time: 06/23/2017  5:24 AM  Result Value Ref Range Status   Specimen Description BLOOD RIGHT HAND  Final   Special Requests   Final    BOTTLES DRAWN AEROBIC AND ANAEROBIC Blood Culture adequate volume   Culture NO GROWTH <12 HOURS  Final   Report Status PENDING  Incomplete    Radiology Reports Dg Chest 2 View  Result Date: 06/01/2017 CLINICAL DATA:  Cough and lethargy EXAM: CHEST  2 VIEW COMPARISON:  01/12/2017 FINDINGS: Cardiac shadow is within normal limits. The lungs  are well aerated bilaterally. Patchy infiltrative changes are seen in the left mid and lower lung as well as the right lung base. No sizable effusion is seen. No bony abnormality is noted. Ventricular shunt is noted on the right. IMPRESSION: Bilateral infiltrates as described. Electronically Signed   By: Alcide Clever M.D.   On: 05/20/2017 12:37   Dg Abd 1 View  Result Date: 05/21/2017 CLINICAL DATA:  New onset of abdominal distention. EXAM: ABDOMEN - 1 VIEW COMPARISON:  05/20/2017. FINDINGS: The bowel  gas pattern is normal. No radio-opaque calculi or other significant radiographic abnormality are seen. Bibasilar lung opacities, incompletely evaluated. RIGHT-sided ventriculoperitoneal shunt catheter is unchanged. Orogastric tube tip lies within the stomach. Cholecystectomy clips. IMPRESSION: Stable exam. Electronically Signed   By: Elsie Stain M.D.   On: 05/21/2017 16:41   Ct Angio Chest Pe W Or Wo Contrast  Result Date: 05/18/2017 CLINICAL DATA:  Shortness of breath. EXAM: CT ANGIOGRAPHY CHEST WITH CONTRAST TECHNIQUE: Multidetector CT imaging of the chest was performed using the standard protocol during bolus administration of intravenous contrast. Multiplanar CT image reconstructions and MIPs were obtained to evaluate the vascular anatomy. CONTRAST:  75 mL of Isovue 370 intravenously. COMPARISON:  Radiographs of May 17, 2017. FINDINGS: Cardiovascular: Satisfactory opacification of the pulmonary arteries to the segmental level. No evidence of pulmonary embolism. Normal heart size. No pericardial effusion. Mediastinum/Nodes: No enlarged mediastinal, hilar, or axillary lymph nodes. Thyroid gland, trachea, and esophagus demonstrate no significant findings. Lungs/Pleura: No pneumothorax is noted. Large left upper and lower lobe airspace opacities are noted most consistent with pneumonia. Smaller right upper and lower lobe opacities are also noted most consistent with pneumonia. Mild bilateral pleural effusions are noted. Upper Abdomen: No acute abnormality. Musculoskeletal: No chest wall abnormality. No acute or significant osseous findings. Review of the MIP images confirms the above findings. IMPRESSION: No definite evidence of pulmonary embolus. Bilateral upper and lower lobe airspace opacities are noted most consistent with pneumonia. Mild bilateral pleural effusions are noted. Electronically Signed   By: Lupita Raider, M.D.   On: 05/18/2017 12:10   US Renal  Result Date: 05/22/2017 CLINICAL  DATA:  41 year old female with acute kidney injury. EXAM: RENAL / URINARY TRACT ULTRASOUND COMPLETE COMPARISON:  09/27/2012 and prior CTs FINDINGS: Right Kidney: Length: 11.5 cm with mild cortical thinning. Echogenicity within normal limits. No mass or hydronephrosis visualized. Left Kidney: Length: 12.7 cm with mild cortical thinning. Echogenicity within normal limits. No mass or hydronephrosis visualized. Bladder: A Foley catheter within the bladder is noted. IMPRESSION: Mild bilateral renal cortical thinning without other significant renal abnormality. Electronically Signed   By: Harmon Pier M.D.   On: 05/22/2017 13:03   Dg Chest Port 1 View  Result Date: 11-Jun-2017 CLINICAL DATA:  Respiratory failure, sepsis, pneumonia. EXAM: PORTABLE CHEST 1 VIEW COMPARISON:  Portable chest x-ray of May 23, 2017 FINDINGS: Confluent alveolar opacities have worsened bilaterally. Air bronchograms are not clearly evident on the left. The cardiac silhouette is obscured. The pulmonary vascularity is also obscured. The endotracheal tube tip projects 2.8 cm above the carina. The esophagogastric tube tip in proximal port project below the inferior margin of the image. The left internal jugular venous catheter tip projects over the distal third of the SVC. A ventriculoperitoneal shunt to the right of midline is present. IMPRESSION: Worsening of bilateral airspace disease consistent with progressive pneumonia-ARDS. The support tubes are in reasonable position. Electronically Signed   By: David  [place] M.D.   On: 06-11-2017  07:14   Dg Chest Port 1 View  Result Date: 05/23/2017 CLINICAL DATA:  Evaluate ET tube placement EXAM: PORTABLE CHEST 1 VIEW COMPARISON:  05/23/2017 FINDINGS: The endotracheal tube tip is above the carina. There is a left IJ catheter with tip in the projection of the right atrium. The nasogastric tube extends below the level of the GE junction. There is a right-sided ventriculoperitoneal shunt catheter.  Diffuse bilateral interstitial and airspace opacities identified compatible with ARDS. The appearance is unchanged from prior exam. IMPRESSION: 1. No change in aeration a lungs compared with previous exam. 2. Support apparatus positioned as above. Electronically Signed   By: Signa Kell M.D.   On: 05/23/2017 23:10   Dg Chest Port 1 View  Result Date: 05/23/2017 CLINICAL DATA:  Respiratory failure EXAM: PORTABLE CHEST 1 VIEW COMPARISON:  05/22/2017 FINDINGS: Patient has a right ventriculoperitoneal shunt. Left IJ central line tip overlies the level of superior vena cava. Endotracheal tube is in place, tip approximately 1.9 cm above the carina. A nasogastric tube is in place, off the image and beyond the level of the mid stomach. The heart size is probably normal. There are patchy airspace filling opacities bilaterally, similar in appearance to prior study. IMPRESSION: Persistent significant bilateral airspace filling opacities. Electronically Signed   By: Norva Pavlov M.D.   On: 05/23/2017 08:33   Dg Chest Port 1 View  Result Date: 05/22/2017 CLINICAL DATA:  Respiratory failure EXAM: PORTABLE CHEST 1 VIEW COMPARISON:  05/20/2017 FINDINGS: Cardiomediastinal silhouette unchanged. An endotracheal tube with tip 1 cm above the carina, NG tube entering the stomach with tip off the field of view, and left IJ central venous catheter with tip overlying the superior cavoatrial junction again noted. Diffuse bilateral airspace opacities, left-greater-than-right, have slightly improved. No pneumothorax. IMPRESSION: Slightly decreased diffuse bilateral airspace opacities, otherwise unchanged appearance the chest. Electronically Signed   By: Harmon Pier M.D.   On: 05/22/2017 07:07   Dg Chest Port 1 View  Result Date: 05/20/2017 CLINICAL DATA:  Respiratory failure. EXAM: PORTABLE CHEST 1 VIEW COMPARISON:  05/19/2017. FINDINGS: Endotracheal tube, NG tube, left IJ line stable position. Heart size normal. Diffuse  bilateral pulmonary infiltrates/edema again noted. No interim change. Small left pleural effusion cannot be excluded. No pneumothorax. IMPRESSION: 1. Lines and tubes in stable position. 2. Diffuse bilateral pulmonary infiltrates/edema again noted. No interim change. Small left pleural effusion cannot be excluded. Electronically Signed   By: Maisie Fus  Register   On: 05/20/2017 06:34   Dg Chest Port 1 View  Result Date: 05/19/2017 CLINICAL DATA:  The EXAM: PORTABLE CHEST 1 VIEW COMPARISON:  Prior radiograph from 05/18/2017. FINDINGS: Endotracheal tube in place with tip positioned 19 mm above the carina. Enteric tube courses in the abdomen. Left IJ approach centra venous catheter in place with tip in the proximal right atrium, stable. Cardiac and mediastinal silhouettes are unchanged. Patchy multifocal opacities with consolidation and air bronchograms again seen involving the bilateral lungs, left greater than right. There is increased opacity at the left lung base as compared to previous, with some improved aeration at the left upper lobe. Right lung relatively similar in appearance. No pleural effusion. No appreciable pneumothorax. Osseous structures unchanged. IMPRESSION: 1. Support apparatus in satisfactory position as above. 2. Persistent patchy multifocal airspace opacities, left greater than right, concerning for multilobar pneumonia. As compared to previous, there is worsened opacity at the left lung base, with slightly improved aeration at the left upper lung. Electronically Signed   By: Sharlet Salina  Phill Myron M.D.   On: 05/19/2017 15:54   Dg Chest Port 1 View  Result Date: 05/18/2017 CLINICAL DATA:  Central line placement EXAM: PORTABLE CHEST 1 VIEW COMPARISON:  05/17/2017 FINDINGS: Left IJ approach central line catheter tip is seen at the cavoatrial junction. No pneumothorax. The tip of an endotracheal tube is 3.1 cm above the carina. Gastric tube extends below the left hemidiaphragm into the expected  location of the stomach. VP shunt tubing projects over the right hemithorax and extends into the abdomen. The tip is excluded on this study. Patchy pneumonic consolidations with air bronchograms are seen in both upper lobes, lingula and left lower lobe. Heart size is normal. The aorta is obscured by the adjacent pneumonia. IMPRESSION: 1. Satisfactory support line and tube positions. 2. Left IJ central line catheter seen with tip at the cavoatrial junction. No pneumothorax. 3. Patchy multilobar pneumonia with air bronchograms. Electronically Signed   By: Tollie Eth M.D.   On: 05/18/2017 19:47   Dg Chest Port 1 View  Result Date: 05/17/2017 CLINICAL DATA:  Cough EXAM: PORTABLE CHEST 1 VIEW COMPARISON:  Chest radiograph May 25, 2017 at 12:07 p.m. FINDINGS: Marked worsening of consolidative opacity in the left mid lung compared to the earlier study. Aeration at the right lung base is improved. No pneumothorax or pleural effusion. IMPRESSION: Marked worsening of left mid lung consolidation, likely infection or aspiration. Electronically Signed   By: Deatra Robinson M.D.   On: 05/17/2017 02:18   Dg Abd Portable 1v  Result Date: 05/23/2017 CLINICAL DATA:  Endotracheal tube and OG tube placements. EXAM: PORTABLE ABDOMEN - 1 VIEW COMPARISON:  05/21/2017 FINDINGS: The enteric tube is present with tip in the left upper quadrant consistent with location in the body of the stomach. Ventricular peritoneal shunt tubing along the right chest in abdomen. Surgical clips in the right upper quadrant. Gas-filled small and large bowel without significant distention, likely indicating ileus. IMPRESSION: Enteric tube tip localizes to the left upper quadrant consistent with location in the body of the stomach. Gas-filled small and large bowel suggest ileus. Electronically Signed   By: Burman Nieves M.D.   On: 05/23/2017 23:22   Dg Abd Portable 1v  Result Date: 05/20/2017 CLINICAL DATA:  Orogastric tube placement. EXAM: PORTABLE  ABDOMEN - 1 VIEW COMPARISON:  Radiographs of May 18, 2017. FINDINGS: The bowel gas pattern is normal. No radio-opaque calculi or other significant radiographic abnormality are seen. Status post cholecystectomy. Distal tip of orogastric tube is seen in proximal stomach. 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 IMPRESSION: No evidence of bowel obstruction or ileus. Distal tip of orogastric tube is seen in proximal stomach. Electronically Signed   By: Lupita Raider, M.D.   On: 05/20/2017 14:39   Dg Abd Portable 1v  Result Date: 05/18/2017 CLINICAL DATA:  OG tube placement EXAM: PORTABLE ABDOMEN - 1 VIEW COMPARISON:  None. FINDINGS: The tip and side port of a gastric tube are seen below the left hemidiaphragm in the expected location of the stomach. The tip however appears to coiled back toward the GE junction band is approximately 2 cm from the GE junction. Scattered gas containing small and large bowel loops without obstructive pattern. No free air. Contrast is seen within both renal collecting systems likely from recent administration of IV contrast. VP shunt tip projects over the left lower quadrant. Cholecystectomy clips are seen in the right upper quadrant. IMPRESSION: 1. Gastric tube tip and side-port are seen in the expected location of stomach however  the tip projects back toward the GE junction and is seen approximately 2 cm from the expected location of the gastroesophageal juncture. 2. VP shunt tubing tip is seen in the left lower quadrant of the abdomen. Electronically Signed   By: Tollie Eth M.D.   On: 05/18/2017 19:44     CBC  Recent Labs Lab 05/18/17 1006  05/20/17 0418 05/21/17 0414 05/22/17 0414 05/23/17 0527 05/23/17 2100 2017/05/25 0438  WBC 17.8*  < > 22.1* 20.9* 26.0* 21.7* 23.4* 14.9*  HGB 12.7  < > 11.8* 11.6* 10.8* 10.6* 10.6* 11.4*  HCT 38.9  < > 36.6 35.3 32.8* 31.9* 33.4* 35.9  PLT 79*  < > 58* 46* 57* 71* 88* 89*  MCV 87.8  < > 87.9 89.5  89.8 88.9 90.5 93.7  MCH 28.7  < > 28.4 29.5 29.7 29.7 28.6 29.9  MCHC 32.7  < > 32.3 32.9 33.1 33.4 31.7* 31.9*  RDW 15.5*  < > 16.4* 16.4* 17.3* 17.2* 18.2* 18.0*  LYMPHSABS 1.0  --  0.4*  --  1.1  --   --   --   MONOABS 0.8  --  1.1*  --  2.0*  --   --   --   EOSABS 0.0  --  0.0  --  0.0  --   --   --   BASOSABS 0.1  --  0.0  --  0.0  --   --   --   < > = values in this interval not displayed.  Chemistries   Recent Labs Lab 05/19/17 0508  05/21/17 0414 05/21/17 2206 05/22/17 0414 05/22/17 1500 05/23/17 0527 05/23/17 2152 05-25-17 0438  NA 148*  < > 143 146* 144 145 146* 146* 144  K 3.0*  < > 3.2* 4.7 4.9 4.4 4.3 4.7 4.3  CL 120*  < > 114* 117* 116* 117* 116* 116* 115*  CO2 22  < > 21* 21*  GLUCOSE 183*  < > 199* 154* 143* 131* 131* 122* 205*  BUN 24*  < > 56* 70* 71* 77* 74* 82* 81*  CREATININE 0.84  < > 1.55* 1.88* 2.07* 2.14* 2.12* 2.40* 2.75*  CALCIUM 7.9*  < > 7.6* 7.8* 7.8* 7.9* 7.8* 7.9* 7.5*  MG 2.2  --  2.2 2.2  --   --   --  2.3  --   AST  --   --   --   --  74*  --   --  66*  --   ALT  --   --   --   --  81*  --   --  77*  --   ALKPHOS  --   --   --   --  120  --   --  121  --   BILITOT  --   --   --   --  0.5  --   --  0.7  --   < > = values in this interval not displayed. ------------------------------------------------------------------------------------------------------------------ estimated creatinine clearance is 28.3 mL/min (A) (by C-G formula based on SCr of 2.75 mg/dL (H)). ------------------------------------------------------------------------------------------------------------------ No results for input(s): HGBA1C in the last 72 hours. ------------------------------------------------------------------------------------------------------------------  Recent Labs  05/23/17 2152  TRIG 126   ------------------------------------------------------------------------------------------------------------------ No results for input(s):  TSH, T4TOTAL, T3FREE, THYROIDAB in the last 72 hours.  Invalid input(s): FREET3 ------------------------------------------------------------------------------------------------------------------ No results for input(s): VITAMINB12, FOLATE, FERRITIN, TIBC, IRON, RETICCTPCT in the last 72 hours.  Coagulation profile  Recent Labs Lab 05/23/17 2152  INR 1.33    No results for input(s): DDIMER in the last 72 hours.  Cardiac Enzymes  Recent Labs Lab 05/21/17 2206 05/23/17 2255 05/31/17 0438  TROPONINI 0.03* 0.04* 0.04*   ------------------------------------------------------------------------------------------------------------------ Invalid input(s): POCBNP    Assessment & Plan  Lindsey Allison  is a 41 y.o. female with a known history of myoclonic dystrophy, anxiety, GERD,hydrocephalus,poor historian, is brought into the ED by her care giver as patient is not feeling well Patient was seen at urgent care on Friday, throat cultures were obtained and patient was given a prescription for azithromycin for possible strep throat,. Both conjunctiva are red and patient is brought into the  Emergency department.Chest x-ray has revealed bilateral infiltrates  #Acute respiratory failure Secondary to severe pneumonia:  # sepsis from aspiration pneumonia #atrial fib with rvr  #Acute kidney injury:myotonic dystrophy. Patient failed weaning trials, extubated 2 times, her prognosis is really poor, patient is on comfort care measures at this time,rales, or services at bedside and plan to take off the ventilator and start morphine drip.     Code Status Orders        Start     Ordered   05/13/2017 1740  Full code  Continuous     04/25/2017 1740    Code Status History    Date Active Date Inactive Code Status Order ID Comments User Context   This patient has a current code status but no historical code status.           Consults   none   DVT Prophylaxis  Lovenox    Lab Results   Component Value Date   PLT 89 (L) May 31, 2017     Time Spent in minutes   35 minutes spent  [facility] M.D on 05/31/17 at 2:01 PM  Between 7am to 6pm - Pager - [phone]  After 6pm go to [email or web address] - password [email or web address] [facility]  [facility]   Office  [phone]

## 2017-06-24 NOTE — Progress Notes (Signed)
[facility] responded to an OR for End of Life. RN stated that the Pt is critically ill and will be transitioned to comfort care. RN stated there is no family and that the Pt will be extubated soon. RN will notify [facility]. [facility] offered a silent prayer bedside.    06/06/2017 1200  Clinical Encounter Type  Visited With Patient;Health care provider  Visit Type Initial;Spiritual support;Critical Care;Patient actively dying  Referral From Nurse  Spiritual Encounters  Spiritual Needs Prayer

## 2017-06-24 NOTE — Progress Notes (Signed)
Patient extubated to comfort care.  Lindsey Allison family/caregivers at bedside.  Morphine drip infusing.

## 2017-06-24 DEATH — deceased
